# Patient Record
Sex: Male | Born: 1935 | Race: White | Hispanic: No | Marital: Married | Smoking: Never smoker
Health system: Southern US, Community
[De-identification: ages and names within clinical notes are randomized; demographics above are authoritative.]

## PROBLEM LIST (undated history)

## (undated) DIAGNOSIS — R413 Other amnesia: Secondary | ICD-10-CM

## (undated) DIAGNOSIS — I350 Nonrheumatic aortic (valve) stenosis: Secondary | ICD-10-CM

## (undated) DIAGNOSIS — I2510 Atherosclerotic heart disease of native coronary artery without angina pectoris: Secondary | ICD-10-CM

## (undated) DIAGNOSIS — K5790 Diverticulosis of intestine, part unspecified, without perforation or abscess without bleeding: Secondary | ICD-10-CM

## (undated) DIAGNOSIS — I639 Cerebral infarction, unspecified: Secondary | ICD-10-CM

## (undated) DIAGNOSIS — N390 Urinary tract infection, site not specified: Secondary | ICD-10-CM

## (undated) DIAGNOSIS — E785 Hyperlipidemia, unspecified: Secondary | ICD-10-CM

## (undated) DIAGNOSIS — D126 Benign neoplasm of colon, unspecified: Secondary | ICD-10-CM

## (undated) DIAGNOSIS — I10 Essential (primary) hypertension: Secondary | ICD-10-CM

## (undated) DIAGNOSIS — J449 Chronic obstructive pulmonary disease, unspecified: Secondary | ICD-10-CM

## (undated) DIAGNOSIS — K648 Other hemorrhoids: Secondary | ICD-10-CM

## (undated) HISTORY — PX: TONSILLECTOMY: SUR1361

## (undated) HISTORY — PX: NASAL SINUS SURGERY: SHX719

## (undated) HISTORY — PX: SKIN GRAFT: SHX250

## (undated) HISTORY — DX: Essential (primary) hypertension: I10

## (undated) HISTORY — PX: INGUINAL HERNIA REPAIR: SUR1180

## (undated) HISTORY — DX: Other amnesia: R41.3

## (undated) HISTORY — PX: OTHER SURGICAL HISTORY: SHX169

## (undated) HISTORY — DX: Diverticulosis of intestine, part unspecified, without perforation or abscess without bleeding: K57.90

## (undated) HISTORY — DX: Chronic obstructive pulmonary disease, unspecified: J44.9

## (undated) HISTORY — DX: Hyperlipidemia, unspecified: E78.5

## (undated) HISTORY — DX: Nonrheumatic aortic (valve) stenosis: I35.0

## (undated) HISTORY — DX: Other hemorrhoids: K64.8

## (undated) HISTORY — DX: Benign neoplasm of colon, unspecified: D12.6

## (undated) HISTORY — PX: SHOULDER SURGERY: SHX246

## (undated) HISTORY — PX: CORONARY ANGIOPLASTY WITH STENT PLACEMENT: SHX49

## (undated) HISTORY — DX: Atherosclerotic heart disease of native coronary artery without angina pectoris: I25.10

## (undated) HISTORY — DX: Cerebral infarction, unspecified: I63.9

## (undated) HISTORY — PX: KNEE SURGERY: SHX244

---

## 1998-02-25 ENCOUNTER — Other Ambulatory Visit: Admission: RE | Admit: 1998-02-25 | Discharge: 1998-02-25 | Payer: Self-pay | Admitting: Otolaryngology

## 1998-03-25 ENCOUNTER — Ambulatory Visit (HOSPITAL_COMMUNITY): Admission: RE | Admit: 1998-03-25 | Discharge: 1998-03-25 | Payer: Self-pay | Admitting: Internal Medicine

## 1999-10-22 ENCOUNTER — Inpatient Hospital Stay (HOSPITAL_COMMUNITY): Admission: EM | Admit: 1999-10-22 | Discharge: 1999-10-26 | Payer: Self-pay | Admitting: Emergency Medicine

## 1999-10-22 ENCOUNTER — Encounter: Payer: Self-pay | Admitting: Emergency Medicine

## 1999-11-05 ENCOUNTER — Ambulatory Visit (HOSPITAL_COMMUNITY): Admission: RE | Admit: 1999-11-05 | Discharge: 1999-11-06 | Payer: Self-pay | Admitting: *Deleted

## 1999-11-24 ENCOUNTER — Encounter (HOSPITAL_COMMUNITY): Admission: RE | Admit: 1999-11-24 | Discharge: 2000-02-22 | Payer: Self-pay | Admitting: Endocrinology

## 2000-01-27 ENCOUNTER — Encounter: Payer: Self-pay | Admitting: Cardiology

## 2000-01-27 ENCOUNTER — Inpatient Hospital Stay (HOSPITAL_COMMUNITY): Admission: EM | Admit: 2000-01-27 | Discharge: 2000-01-30 | Payer: Self-pay | Admitting: Emergency Medicine

## 2000-03-07 ENCOUNTER — Encounter (HOSPITAL_COMMUNITY): Admission: RE | Admit: 2000-03-07 | Discharge: 2000-06-05 | Payer: Self-pay | Admitting: *Deleted

## 2000-04-14 ENCOUNTER — Encounter: Payer: Self-pay | Admitting: *Deleted

## 2000-04-15 ENCOUNTER — Encounter: Payer: Self-pay | Admitting: *Deleted

## 2000-04-15 ENCOUNTER — Inpatient Hospital Stay (HOSPITAL_COMMUNITY): Admission: RE | Admit: 2000-04-15 | Discharge: 2000-04-17 | Payer: Self-pay | Admitting: *Deleted

## 2000-06-06 ENCOUNTER — Encounter (HOSPITAL_COMMUNITY): Admission: RE | Admit: 2000-06-06 | Discharge: 2000-09-04 | Payer: Self-pay | Admitting: *Deleted

## 2000-09-05 ENCOUNTER — Encounter (HOSPITAL_COMMUNITY): Admission: RE | Admit: 2000-09-05 | Discharge: 2000-12-04 | Payer: Self-pay | Admitting: *Deleted

## 2000-09-30 ENCOUNTER — Encounter: Payer: Self-pay | Admitting: Endocrinology

## 2000-09-30 ENCOUNTER — Ambulatory Visit (HOSPITAL_COMMUNITY): Admission: RE | Admit: 2000-09-30 | Discharge: 2000-09-30 | Payer: Self-pay | Admitting: Endocrinology

## 2000-10-12 ENCOUNTER — Encounter: Admission: RE | Admit: 2000-10-12 | Discharge: 2000-10-24 | Payer: Self-pay | Admitting: Neurology

## 2000-12-05 ENCOUNTER — Encounter (HOSPITAL_COMMUNITY): Admission: RE | Admit: 2000-12-05 | Discharge: 2001-03-05 | Payer: Self-pay | Admitting: *Deleted

## 2001-03-06 ENCOUNTER — Encounter (HOSPITAL_COMMUNITY): Admission: RE | Admit: 2001-03-06 | Discharge: 2001-06-04 | Payer: Self-pay | Admitting: *Deleted

## 2001-06-05 ENCOUNTER — Encounter (HOSPITAL_COMMUNITY): Admission: RE | Admit: 2001-06-05 | Discharge: 2001-09-03 | Payer: Self-pay | Admitting: *Deleted

## 2001-09-22 ENCOUNTER — Encounter (HOSPITAL_COMMUNITY): Admission: RE | Admit: 2001-09-22 | Discharge: 2001-12-21 | Payer: Self-pay | Admitting: *Deleted

## 2001-12-23 ENCOUNTER — Encounter (HOSPITAL_COMMUNITY): Admission: RE | Admit: 2001-12-23 | Discharge: 2002-03-23 | Payer: Self-pay | Admitting: *Deleted

## 2002-03-24 ENCOUNTER — Encounter (HOSPITAL_COMMUNITY): Admission: RE | Admit: 2002-03-24 | Discharge: 2002-06-22 | Payer: Self-pay | Admitting: *Deleted

## 2002-06-23 ENCOUNTER — Encounter (HOSPITAL_COMMUNITY): Admission: RE | Admit: 2002-06-23 | Discharge: 2002-09-21 | Payer: Self-pay | Admitting: Interventional Cardiology

## 2002-09-22 ENCOUNTER — Encounter (HOSPITAL_COMMUNITY): Admission: RE | Admit: 2002-09-22 | Discharge: 2002-12-21 | Payer: Self-pay | Admitting: Cardiology

## 2002-12-23 ENCOUNTER — Encounter (HOSPITAL_COMMUNITY): Admission: RE | Admit: 2002-12-23 | Discharge: 2003-03-23 | Payer: Self-pay | Admitting: Cardiology

## 2003-03-24 ENCOUNTER — Encounter (HOSPITAL_COMMUNITY): Admission: RE | Admit: 2003-03-24 | Discharge: 2003-06-22 | Payer: Self-pay | Admitting: Cardiology

## 2003-06-23 ENCOUNTER — Encounter (HOSPITAL_COMMUNITY): Admission: RE | Admit: 2003-06-23 | Discharge: 2003-09-21 | Payer: Self-pay | Admitting: Cardiology

## 2003-09-23 ENCOUNTER — Encounter (HOSPITAL_COMMUNITY): Admission: RE | Admit: 2003-09-23 | Discharge: 2003-12-22 | Payer: Self-pay | Admitting: Cardiology

## 2003-10-18 ENCOUNTER — Encounter: Admission: RE | Admit: 2003-10-18 | Discharge: 2003-10-18 | Payer: Self-pay

## 2003-10-22 ENCOUNTER — Ambulatory Visit (HOSPITAL_BASED_OUTPATIENT_CLINIC_OR_DEPARTMENT_OTHER): Admission: RE | Admit: 2003-10-22 | Discharge: 2003-10-22 | Payer: Self-pay

## 2003-10-22 ENCOUNTER — Ambulatory Visit (HOSPITAL_COMMUNITY): Admission: RE | Admit: 2003-10-22 | Discharge: 2003-10-22 | Payer: Self-pay

## 2003-12-24 ENCOUNTER — Encounter (HOSPITAL_COMMUNITY): Admission: RE | Admit: 2003-12-24 | Discharge: 2004-03-23 | Payer: Self-pay | Admitting: Cardiology

## 2004-03-24 ENCOUNTER — Encounter (HOSPITAL_COMMUNITY): Admission: RE | Admit: 2004-03-24 | Discharge: 2004-06-22 | Payer: Self-pay | Admitting: Cardiology

## 2004-06-23 ENCOUNTER — Encounter (HOSPITAL_COMMUNITY): Admission: RE | Admit: 2004-06-23 | Discharge: 2004-09-21 | Payer: Self-pay | Admitting: Cardiology

## 2004-09-22 ENCOUNTER — Encounter (HOSPITAL_COMMUNITY): Admission: RE | Admit: 2004-09-22 | Discharge: 2004-12-21 | Payer: Self-pay | Admitting: Cardiology

## 2004-11-06 ENCOUNTER — Ambulatory Visit: Payer: Self-pay | Admitting: *Deleted

## 2004-11-19 ENCOUNTER — Ambulatory Visit: Payer: Self-pay

## 2004-12-23 ENCOUNTER — Encounter (HOSPITAL_COMMUNITY): Admission: RE | Admit: 2004-12-23 | Discharge: 2005-03-23 | Payer: Self-pay | Admitting: Cardiology

## 2005-01-09 ENCOUNTER — Emergency Department (HOSPITAL_COMMUNITY): Admission: EM | Admit: 2005-01-09 | Discharge: 2005-01-09 | Payer: Self-pay | Admitting: Family Medicine

## 2005-03-24 ENCOUNTER — Encounter (HOSPITAL_COMMUNITY): Admission: RE | Admit: 2005-03-24 | Discharge: 2005-06-22 | Payer: Self-pay | Admitting: Cardiology

## 2005-04-23 ENCOUNTER — Ambulatory Visit: Payer: Self-pay | Admitting: *Deleted

## 2005-06-23 ENCOUNTER — Encounter (HOSPITAL_COMMUNITY): Admission: RE | Admit: 2005-06-23 | Discharge: 2005-07-23 | Payer: Self-pay | Admitting: Cardiology

## 2005-08-22 ENCOUNTER — Encounter (HOSPITAL_COMMUNITY): Admission: RE | Admit: 2005-08-22 | Discharge: 2005-11-21 | Payer: Self-pay | Admitting: Cardiology

## 2005-11-03 ENCOUNTER — Ambulatory Visit: Payer: Self-pay | Admitting: Cardiology

## 2005-11-23 ENCOUNTER — Encounter (HOSPITAL_COMMUNITY): Admission: RE | Admit: 2005-11-23 | Discharge: 2006-02-21 | Payer: Self-pay | Admitting: Cardiology

## 2005-11-29 ENCOUNTER — Ambulatory Visit: Payer: Self-pay

## 2006-02-22 ENCOUNTER — Encounter (HOSPITAL_COMMUNITY): Admission: RE | Admit: 2006-02-22 | Discharge: 2006-05-23 | Payer: Self-pay | Admitting: Cardiology

## 2006-04-19 ENCOUNTER — Encounter: Payer: Self-pay | Admitting: Internal Medicine

## 2006-04-19 ENCOUNTER — Ambulatory Visit: Payer: Self-pay | Admitting: Internal Medicine

## 2006-05-24 ENCOUNTER — Encounter (HOSPITAL_COMMUNITY): Admission: RE | Admit: 2006-05-24 | Discharge: 2006-08-22 | Payer: Self-pay | Admitting: Cardiology

## 2006-06-27 ENCOUNTER — Ambulatory Visit: Payer: Self-pay | Admitting: Internal Medicine

## 2006-06-29 ENCOUNTER — Ambulatory Visit: Payer: Self-pay | Admitting: Cardiology

## 2006-08-03 ENCOUNTER — Ambulatory Visit: Payer: Self-pay | Admitting: Cardiology

## 2006-08-05 ENCOUNTER — Ambulatory Visit: Payer: Self-pay

## 2006-08-23 ENCOUNTER — Encounter (HOSPITAL_COMMUNITY): Admission: RE | Admit: 2006-08-23 | Discharge: 2006-11-21 | Payer: Self-pay | Admitting: Cardiology

## 2006-11-22 ENCOUNTER — Encounter (HOSPITAL_COMMUNITY): Admission: RE | Admit: 2006-11-22 | Discharge: 2007-02-20 | Payer: Self-pay | Admitting: Cardiology

## 2006-12-05 ENCOUNTER — Ambulatory Visit: Payer: Self-pay | Admitting: Cardiology

## 2007-02-21 ENCOUNTER — Encounter (HOSPITAL_COMMUNITY): Admission: RE | Admit: 2007-02-21 | Discharge: 2007-05-22 | Payer: Self-pay | Admitting: Cardiology

## 2007-05-23 ENCOUNTER — Encounter (HOSPITAL_COMMUNITY): Admission: RE | Admit: 2007-05-23 | Discharge: 2007-08-21 | Payer: Self-pay | Admitting: Cardiology

## 2007-07-27 ENCOUNTER — Ambulatory Visit: Payer: Self-pay | Admitting: Internal Medicine

## 2007-08-23 ENCOUNTER — Encounter (HOSPITAL_COMMUNITY): Admission: RE | Admit: 2007-08-23 | Discharge: 2007-11-21 | Payer: Self-pay | Admitting: Cardiology

## 2007-09-05 ENCOUNTER — Encounter: Payer: Self-pay | Admitting: Internal Medicine

## 2007-09-05 ENCOUNTER — Ambulatory Visit: Payer: Self-pay | Admitting: Internal Medicine

## 2007-11-14 ENCOUNTER — Ambulatory Visit (HOSPITAL_COMMUNITY): Admission: RE | Admit: 2007-11-14 | Discharge: 2007-11-14 | Payer: Self-pay | Admitting: Endocrinology

## 2007-11-23 ENCOUNTER — Encounter (HOSPITAL_COMMUNITY): Admission: RE | Admit: 2007-11-23 | Discharge: 2007-12-22 | Payer: Self-pay | Admitting: Cardiology

## 2007-12-11 ENCOUNTER — Ambulatory Visit: Payer: Self-pay | Admitting: Cardiology

## 2007-12-24 ENCOUNTER — Encounter (HOSPITAL_COMMUNITY): Admission: RE | Admit: 2007-12-24 | Discharge: 2008-03-23 | Payer: Self-pay | Admitting: Cardiology

## 2008-02-15 DIAGNOSIS — J449 Chronic obstructive pulmonary disease, unspecified: Secondary | ICD-10-CM

## 2008-02-15 DIAGNOSIS — I359 Nonrheumatic aortic valve disorder, unspecified: Secondary | ICD-10-CM | POA: Insufficient documentation

## 2008-02-15 DIAGNOSIS — E785 Hyperlipidemia, unspecified: Secondary | ICD-10-CM | POA: Insufficient documentation

## 2008-03-24 ENCOUNTER — Encounter (HOSPITAL_COMMUNITY): Admission: RE | Admit: 2008-03-24 | Discharge: 2008-06-22 | Payer: Self-pay | Admitting: Cardiology

## 2008-06-23 ENCOUNTER — Encounter (HOSPITAL_COMMUNITY): Admission: RE | Admit: 2008-06-23 | Discharge: 2008-09-21 | Payer: Self-pay | Admitting: Cardiology

## 2008-09-23 ENCOUNTER — Encounter (HOSPITAL_COMMUNITY): Admission: RE | Admit: 2008-09-23 | Discharge: 2008-12-22 | Payer: Self-pay | Admitting: Cardiology

## 2008-12-12 ENCOUNTER — Ambulatory Visit: Payer: Self-pay | Admitting: Cardiology

## 2008-12-23 ENCOUNTER — Encounter (HOSPITAL_COMMUNITY): Admission: RE | Admit: 2008-12-23 | Discharge: 2009-03-23 | Payer: Self-pay | Admitting: Cardiology

## 2009-01-24 ENCOUNTER — Encounter: Admission: RE | Admit: 2009-01-24 | Discharge: 2009-01-24 | Payer: Self-pay | Admitting: Internal Medicine

## 2009-03-24 ENCOUNTER — Encounter (HOSPITAL_COMMUNITY): Admission: RE | Admit: 2009-03-24 | Discharge: 2009-06-22 | Payer: Self-pay | Admitting: Cardiology

## 2009-06-23 ENCOUNTER — Encounter (HOSPITAL_COMMUNITY): Admission: RE | Admit: 2009-06-23 | Discharge: 2009-09-21 | Payer: Self-pay | Admitting: Cardiology

## 2009-08-11 ENCOUNTER — Telehealth: Payer: Self-pay | Admitting: Cardiology

## 2009-09-03 ENCOUNTER — Encounter: Payer: Self-pay | Admitting: Cardiology

## 2009-09-05 ENCOUNTER — Telehealth: Payer: Self-pay | Admitting: Cardiology

## 2009-09-22 ENCOUNTER — Encounter (HOSPITAL_COMMUNITY): Admission: RE | Admit: 2009-09-22 | Discharge: 2009-12-21 | Payer: Self-pay | Admitting: Cardiology

## 2009-10-08 ENCOUNTER — Telehealth: Payer: Self-pay | Admitting: Cardiology

## 2009-12-17 ENCOUNTER — Ambulatory Visit: Payer: Self-pay | Admitting: Cardiology

## 2009-12-23 ENCOUNTER — Encounter (HOSPITAL_COMMUNITY): Admission: RE | Admit: 2009-12-23 | Discharge: 2010-03-23 | Payer: Self-pay | Admitting: Cardiology

## 2010-01-01 ENCOUNTER — Encounter: Payer: Self-pay | Admitting: Cardiology

## 2010-01-01 ENCOUNTER — Ambulatory Visit: Payer: Self-pay | Admitting: Cardiology

## 2010-01-01 ENCOUNTER — Ambulatory Visit: Payer: Self-pay

## 2010-01-01 ENCOUNTER — Ambulatory Visit (HOSPITAL_COMMUNITY): Admission: RE | Admit: 2010-01-01 | Discharge: 2010-01-01 | Payer: Self-pay | Admitting: Cardiology

## 2010-01-07 ENCOUNTER — Telehealth: Payer: Self-pay | Admitting: Cardiology

## 2010-03-24 ENCOUNTER — Encounter (HOSPITAL_COMMUNITY): Admission: RE | Admit: 2010-03-24 | Discharge: 2010-06-22 | Payer: Self-pay | Admitting: Cardiology

## 2010-06-23 ENCOUNTER — Encounter (HOSPITAL_COMMUNITY): Admission: RE | Admit: 2010-06-23 | Discharge: 2010-09-21 | Payer: Self-pay | Admitting: Cardiology

## 2010-08-19 ENCOUNTER — Encounter: Payer: Self-pay | Admitting: Cardiology

## 2010-09-22 ENCOUNTER — Encounter (HOSPITAL_COMMUNITY)
Admission: RE | Admit: 2010-09-22 | Discharge: 2010-12-21 | Payer: Self-pay | Source: Home / Self Care | Attending: Cardiology | Admitting: Cardiology

## 2010-11-05 ENCOUNTER — Encounter: Payer: Self-pay | Admitting: Cardiovascular Disease

## 2010-11-26 ENCOUNTER — Ambulatory Visit
Admission: RE | Admit: 2010-11-26 | Discharge: 2010-11-26 | Payer: Self-pay | Source: Home / Self Care | Attending: Cardiovascular Disease | Admitting: Cardiovascular Disease

## 2010-11-26 ENCOUNTER — Encounter: Payer: Self-pay | Admitting: Cardiovascular Disease

## 2010-12-22 NOTE — Letter (Signed)
[facility]   Imported By: Marylou Mccoy 09/10/2010 13:24:41  _____________________________________________________________________  External Attachment:    Type:   Image     Comment:   External Document

## 2010-12-22 NOTE — Progress Notes (Signed)
Summary: returning call  Phone Note Call from Patient Call back at Home Phone [phone]   Caller: Patient Reason for Call: Talk to Nurse Summary of Call: returning call Initial call taken by: Migdalia Dk,  January 07, 2010 1:30 PM  Follow-up for Phone Call        I spoke with the pt. Follow-up by: Sherri Rad, RN, BSN,  January 07, 2010 2:08 PM

## 2010-12-22 NOTE — Assessment & Plan Note (Signed)
Summary: yearly   Visit Type:  Follow-up Primary Provider:  Dr Laurene Footman  CC:  pt has had the flu since sun. No cardaic complaints.  History of Present Illness: The patient is 77 years old and return for management of CAD. In 2000 he had a lateral MI and was treated with a bare-metal stent to the circumflex artery. He had staged bare-metal stent to the LAD. He later developed in-stent restenosis of the circumflex artery and required repeat PCI. His last catheterization was in 2001 and this was complicated by a stroke from which he separately recovered. At that time he had 70% in-stent restenosis but no further treatment was performed.  He says he's done quite well with no recent chest pain shortness of breath or palpitations.  His other medical problems include hypertension hyperlipidemia. He also has COPD and has a history of mild aortic stenosis.                                              \ \  Current Medications (verified): 1)  Altace 2.5 Mg Caps (Ramipril) .... Take One Tablet By Mouth Once Daily. 2)  Metoprolol Tartrate 50 Mg Tabs (Metoprolol Tartrate) .... 1/2 Two Times A Day 3)  Plavix 75 Mg Tabs (Clopidogrel Bisulfate) .... Take One Tablet By Mouth Daily 4)  Accolate 20 Mg Tabs (Zafirlukast) .Marland Kitchen.. 1 Tab Once Daily 5)  Lipitor 40 Mg Tabs (Atorvastatin Calcium) .... Take One Tablet By Mouth Daily. 6)  Nasonex 50 Mcg/act Susp (Mometasone Furoate) .... 2 Sprays At Bed Time (Nose) 7)  Advair Diskus 250-50 Mcg/dose Aepb (Fluticasone-Salmeterol) .Marland Kitchen.. 1 Puff Two Times A Day 8)  Multivitamins   Tabs (Multiple Vitamin) .Marland Kitchen.. 1 Tab Once Daily 9)  Calicum .Marland Kitchen.. 1 Tab Once Daily 10)  Fish Oil 1000mg  .... 1 Tab Every Other Day  Allergies (verified): 1)  ! Jonne Ply  Past History:  Past Medical History: Reviewed history from 12/15/2009 and no changes required. 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 Coronary artery status post prior myocardial infarction and     multiple percutaneous interventions as described above with     nonobstructive disease at last cath and low-risk Myoview scan in     2007. 2. Paraprocedural stroke in 2001 with little residual following     diagnostic catheterization. 3. Good left ventricular function. 4. Mild aortic stenosis. 5. Hypertension. 6. Hyperlipidemia. 7. Chronic obstructive pulmonary disease.   Review of Systems       ROS is negative except as outlined in HPI.   Vital Signs:  Patient profile:   77 year old male Height:      65 inches Weight:      108 pounds BMI:     18.04 Pulse rate:   55 / minute BP sitting:   116 / 54  (left arm) Cuff size:   regular  Vitals Entered By: Burnett Kanaris, CNA (December 17, 2009 9:45 AM)  Physical Exam  Additional Exam:  Gen. Well-nourished, in no distress   Neck: No JVD, thyroid not enlarged, no carotid bruits Lungs: No tachypnea, clear without rales, rhonchi or wheezes Cardiovascular: Rhythm regular, PMI not displaced,  heart sounds  normal, grade 2/6 systolic ejection murmur, no peripheral edema, pulses normal in all  4 extremities. Abdomen: BS normal, abdomen soft and non-tender without masses or organomegaly, no hepatosplenomegaly. MS: No deformities, no cyanosis or clubbing   Neuro:  No focal sns   Skin:  no lesions    Impression & Recommendations:  Problem # 1:  CAD (ICD-414.00) He had previous PCI procedures in 2000 as described above. His last catheterization was complicated by a stroke so we should have a moderately high threshold for catheter and intervention. This is not an issue now since he is doing very well has had no recent symptoms of chest pain shortness of breath or palpitations.   Orders: EKG w/  Interpretation (93000)  Problem # 2:  HYPERTENSION (ICD-401.9) This is well controlled on current medications. His updated medication list for this problem includes:    Altace 2.5 Mg Caps (Ramipril) .Marland Kitchen... Take one tablet by mouth once daily.    Metoprolol Tartrate 50 Mg Tabs (Metoprolol tartrate) .Marland Kitchen... 1/2 two times a day  Problem # 3:  HYPERLIPIDEMIA (ICD-272.4) He is scheduled to have a lipid profile with Dr. with Dr. Evlyn Kanner next month. His updated medication list for this problem includes:    Lipitor 40 Mg Tabs (Atorvastatin calcium) .Marland Kitchen... Take one tablet by mouth daily.  Problem # 4:  AORTIC STENOSIS, MILD (ICD-424.1) He has a history of aortic stenosis and hasn't had an echocardiogram and a number of years. His murmur does not sound like his aortic stenosis is very severe but we will repeat his echocardiogram. His updated medication list for this problem includes:    Altace 2.5 Mg Caps (Ramipril) .Marland Kitchen... Take one tablet by mouth once daily.    Metoprolol Tartrate 50 Mg Tabs (Metoprolol tartrate) .Marland Kitchen... 1/2 two times a day    Nitrostat 0.4 Mg Subl (Nitroglycerin) .Marland Kitchen... 1 tablet under tongue at onset of chest pain; you may repeat every 5 minutes for up to 3 doses.  Orders: EKG w/ Interpretation (93000) Echocardiogram (Echo)  His updated medication list for this problem includes:    Altace 2.5 Mg Caps (Ramipril) .Marland Kitchen... Take one tablet by mouth once daily.    Metoprolol Tartrate 50 Mg Tabs (Metoprolol tartrate) .Marland Kitchen... 1/2 two times a day    Nitrostat 0.4 Mg Subl (Nitroglycerin) .Marland Kitchen... 1 tablet under tongue at onset of chest pain; you may repeat every 5 minutes for up to 3 doses.  Patient Instructions: 1)  Your physician wants you to follow-up in: 1 year with Dr. Clifton James.  You will receive a reminder letter in the mail two months in advance. If you don't receive a letter, please call our office to schedule the follow-up appointment. 2)  Your physician has requested that you have an  echocardiogram.  Echocardiography is a painless test that uses sound waves to create images of your heart. It provides your doctor with information about the size and shape of your heart and how well your heart's chambers and valves are working.  This procedure takes approximately one hour. There are no restrictions for this procedure. Prescriptions: NITROSTAT 0.4 MG SUBL (NITROGLYCERIN) 1 tablet under tongue at onset of chest pain; you may repeat every 5 minutes for up to 3 doses.  #25 x 3   Entered by:   Sherri Rad, RN, BSN   Authorized by:   Lenoria Farrier, MD, [facility]   Signed by:   Sherri Rad, RN, BSN on 12/17/2009   Method used:   [facility]        [facility] [address] [phone]* (retail)  [address]       [place], [place]  [address]       Ph: [phone] or [phone]       Fax: [phone]   RxID:   [id number] METOPROLOL TARTRATE 50 MG TABS (METOPROLOL TARTRATE) 1/2 two times a day  #30 x 11   Entered by:   Sherri Rad, RN, BSN   Authorized by:   Lenoria Farrier, MD, [facility]   Signed by:   Sherri Rad, RN, BSN on 12/17/2009   Method used:   [facility]ronically to        [facility] [address] [phone]* (retail)       [address]       [place], [place]  [address]       Ph: [phone] or [phone]       Fax: [phone]   RxID:   [id number] ALTACE 2.5 MG CAPS (RAMIPRIL) Take one tablet by mouth once daily.  #30 x 11   Entered by:   Sherri Rad, RN, BSN   Authorized by:   Lenoria Farrier, MD, [facility]   Signed by:   Sherri Rad, RN, BSN on 12/17/2009   Method used:   Electronically to        [facility] [address] [phone]* (retail)       [address]       [place], [place]  [address]       Ph: [phone] or [phone]       Fax: [phone]   RxID:   [id number]

## 2010-12-24 ENCOUNTER — Encounter (HOSPITAL_COMMUNITY): Payer: Self-pay | Attending: Cardiovascular Disease

## 2010-12-24 ENCOUNTER — Ambulatory Visit (HOSPITAL_COMMUNITY): Payer: Self-pay

## 2010-12-24 DIAGNOSIS — Z8249 Family history of ischemic heart disease and other diseases of the circulatory system: Secondary | ICD-10-CM | POA: Insufficient documentation

## 2010-12-24 DIAGNOSIS — Z5189 Encounter for other specified aftercare: Secondary | ICD-10-CM | POA: Insufficient documentation

## 2010-12-24 DIAGNOSIS — E785 Hyperlipidemia, unspecified: Secondary | ICD-10-CM | POA: Insufficient documentation

## 2010-12-24 DIAGNOSIS — Z9861 Coronary angioplasty status: Secondary | ICD-10-CM | POA: Insufficient documentation

## 2010-12-24 DIAGNOSIS — I2510 Atherosclerotic heart disease of native coronary artery without angina pectoris: Secondary | ICD-10-CM | POA: Insufficient documentation

## 2010-12-24 NOTE — Assessment & Plan Note (Signed)
Summary: per check out/sf   Visit Type:  1 yr f/u Primary Provider:  Dr Laurene Footman  CC:  pt states he would like a copy of his EKG faxed to Dr. Laurene Footman...denies any cardiac complaints today..pt previously saw Dr. Charlies Constable.  History of Present Illness: The patient is 77 yo male with history of CAD, HTN, HL, mild aortic valve stenosis,  and COPD. He has been previously followed by Dr Juanda Chance. He is here today fro cardiac follow up and to establish with me.  In 2000 he had a lateral MI and was treated with a bare-metal stent to the circumflex artery. He had staged bare-metal stent to the LAD. He later developed in-stent restenosis of the circumflex artery and required repeat PCI. His last catheterization was in 2001 and this was complicated by a stroke from which he separately recovered. At that time he had 70% in-stent restenosis but no further treatment was performed.   He tells me that he has been feeling well. He has had no chest pains. He does his yard work. He still attends cardiac rehab at [facility] for the last ten years. His breathing has been normal. His exercise tolerance is good.     Current Medications (verified): 1)  Altace 2.5 Mg Caps (Ramipril) .... Take One Tablet By Mouth Once Daily. 2)  Metoprolol Tartrate 50 Mg Tabs (Metoprolol Tartrate) .... 1/2 Two Times A Day 3)  Plavix 75 Mg Tabs (Clopidogrel Bisulfate) .... Take One Tablet By Mouth Daily 4)  Accolate 20 Mg Tabs (Zafirlukast) .Marland Kitchen.. 1 Tab Once Daily 5)  Lipitor 40 Mg Tabs (Atorvastatin Calcium) .... Take One Tablet By Mouth Daily. 6)  Nasonex 50 Mcg/act Susp (Mometasone Furoate) .... 2 Sprays At Bed Time (Nose) 7)  Advair Diskus 250-50 Mcg/dose Aepb (Fluticasone-Salmeterol) .Marland Kitchen.. 1 Puff Two Times A Day 8)  Multivitamins   Tabs (Multiple Vitamin) .Marland Kitchen.. 1 Tab Once Daily 9)  Viactiv Multi-Vitamin  Chew (Multiple Vitamins-Calcium) .Marland Kitchen.. 1 Chew Once Daily 10)  Fish Oil 1000 Mg Caps (Omega-3 Fatty Acids) .Marland Kitchen.. 1 Cap Every  Other Day 11)  Nitrostat 0.4 Mg Subl (Nitroglycerin) .Marland Kitchen.. 1 Tablet Under Tongue At Onset of Chest Pain; You May Repeat Every 5 Minutes For Up To 3 Doses.  Allergies: 1)  ! Jonne Ply  Past History:  Past Medical History: Reviewed history from 12/15/2009 and no changes required. Current Problems:  ADENOMATOUS COLONIC POLYP (ICD-211.3) INTERNAL HEMORRHOIDS (ICD-455.0) DIVERTICULOSIS, COLON (ICD-562.10) HYPERTENSION (ICD-401.9) COPD (ICD-496) AORTIC STENOSIS, MILD (ICD-424.1) HYPERLIPIDEMIA (ICD-272.4) CAD (ICD-414.00) 1. Coronary artery status post prior myocardial infarction and     multiple percutaneous interventions as described above with     nonobstructive disease at last cath and low-risk Myoview scan in     2007. 2. Paraprocedural stroke in 2001 with little residual following     diagnostic catheterization. 3. Good left ventricular function. 4. Mild aortic stenosis. 5. Hypertension. 6. Hyperlipidemia. 7. Chronic obstructive pulmonary disease.   Past Surgical History: Reviewed history from 02/15/2008 and no changes required. Left shoulder surgery Left inguinal hernia repair Left knee surgery Sinus surgery Tonsillectomy  Family History: No CAD  Social History: Married since 1999 1 son Retired-[employer] for 44 years Never smoked 1 glass red wine every other day No illcit drug use  Review of Systems  The patient denies fatigue, malaise, fever, weight gain/loss, vision loss, decreased hearing, hoarseness, chest pain, palpitations, shortness of breath, prolonged cough, wheezing, sleep apnea, coughing up blood, abdominal pain, blood in stool,  nausea, vomiting, diarrhea, heartburn, incontinence, blood in urine, muscle weakness, joint pain, leg swelling, rash, skin lesions, headache, fainting, dizziness, depression, anxiety, enlarged lymph nodes, easy bruising or bleeding, and environmental allergies.    Vital Signs:  Patient profile:   76 year old male Height:       65 inches Weight:      109.25 pounds BMI:     18.25 Pulse rate:   51 / minute Pulse rhythm:   irregular BP sitting:   122 / 64  (left arm) Cuff size:   regular  Vitals Entered By: Danielle Rankin, CMA (November 26, 2010 10:21 AM)  Physical Exam  General:  General: Well developed, well nourished, NAD HEENT: OP clear, mucus membranes moist SKIN: warm, dry Neuro: No focal deficits Musculoskeletal: Muscle strength 5/5 all ext Psychiatric: Mood and affect normal Neck: No JVD, no carotid bruits, no thyromegaly, no lymphadenopathy. Lungs:Clear bilaterally, no wheezes, rhonci, crackles CV: RRR with systolic  murmur, No gallops rubs Abdomen: soft, NT, ND, BS present Extremities: No edema, pulses 2+.    EKG  Procedure date:  11/26/2010  Findings:      Sinus bradycardia, rate 51 bpm. Incomplete RBBB  Impression & Recommendations:  Problem # 1:  CAD (ICD-414.00) Stable. No changes. He is on Plavix. He has an ASA allergy.   His updated medication list for this problem includes:    Altace 2.5 Mg Caps (Ramipril) .Marland Kitchen... Take one tablet by mouth once daily.    Metoprolol Tartrate 50 Mg Tabs (Metoprolol tartrate) .Marland Kitchen... 1/2 two times a day    Plavix 75 Mg Tabs (Clopidogrel bisulfate) .Marland Kitchen... Take one tablet by mouth daily    Nitrostat 0.4 Mg Subl (Nitroglycerin) .Marland Kitchen... 1 tablet under tongue at onset of chest pain; you may repeat every 5 minutes for up to 3 doses.  Problem # 2:  HYPERTENSION (ICD-401.9) Well controlled.   His updated medication list for this problem includes:    Altace 2.5 Mg Caps (Ramipril) .Marland Kitchen... Take one tablet by mouth once daily.    Metoprolol Tartrate 50 Mg Tabs (Metoprolol tartrate) .Marland Kitchen... 1/2 two times a day  Problem # 3:  HYPERLIPIDEMIA (ICD-272.4) Followed by Dr. Evlyn Kanner. No changes today. He is due for repeat lipids in March in primary care.   His updated medication list for this problem includes:    Lipitor 40 Mg Tabs (Atorvastatin calcium) .Marland Kitchen... Take one tablet by  mouth daily.  Problem # 4:  AORTIC STENOSIS, MILD (ICD-424.1) Repeat echo in one year.   His updated medication list for this problem includes:    Altace 2.5 Mg Caps (Ramipril) .Marland Kitchen... Take one tablet by mouth once daily.    Metoprolol Tartrate 50 Mg Tabs (Metoprolol tartrate) .Marland Kitchen... 1/2 two times a day    Nitrostat 0.4 Mg Subl (Nitroglycerin) .Marland Kitchen... 1 tablet under tongue at onset of chest pain; you may repeat every 5 minutes for up to 3 doses.  Patient Instructions: 1)  Your physician recommends that you schedule a follow-up appointment in: 1 year 2)  Your physician recommends that you continue on your current medications as directed. Please refer to the Current Medication list given to you today. 3)  Your physician has requested that you have an echocardiogram in 1 year. Echocardiography is a painless test that uses sound waves to create images of your heart. It provides your doctor with information about the size and shape of your heart and how well your heart's chambers and valves are working.  This procedure  takes approximately one hour. There are no restrictions for this procedure. Prescriptions: NITROSTAT 0.4 MG SUBL (NITROGLYCERIN) 1 tablet under tongue at onset of chest pain; you may repeat every 5 minutes for up to 3 doses.  #25 x 6   Entered by:   Danielle Rankin, CMA   Authorized by:   Verne Carrow, MD   Signed by:   Danielle Rankin, CMA on 11/26/2010   Method used:   Electronically to        [facility] [address] [phone]* (retail)       [address]       [place], [place]  [address]       Ph: [phone] or [phone]       Fax: [phone]   RxID:   [id number]

## 2010-12-29 ENCOUNTER — Encounter (HOSPITAL_COMMUNITY): Payer: Self-pay

## 2010-12-29 ENCOUNTER — Ambulatory Visit (HOSPITAL_COMMUNITY): Payer: Self-pay

## 2010-12-30 NOTE — Miscellaneous (Signed)
Summary: [facility] Flow [facility]   [facility]   Imported By: Roderic Ovens 12/23/2010 10:05:06  _____________________________________________________________________  External Attachment:    Type:   Image     Comment:   External Document

## 2010-12-31 ENCOUNTER — Encounter (HOSPITAL_COMMUNITY): Payer: Self-pay

## 2010-12-31 ENCOUNTER — Ambulatory Visit (HOSPITAL_COMMUNITY): Payer: Self-pay

## 2011-01-05 ENCOUNTER — Ambulatory Visit (HOSPITAL_COMMUNITY): Payer: Self-pay

## 2011-01-05 ENCOUNTER — Encounter (HOSPITAL_COMMUNITY): Payer: Self-pay

## 2011-01-07 ENCOUNTER — Encounter (HOSPITAL_COMMUNITY): Payer: Self-pay

## 2011-01-07 ENCOUNTER — Ambulatory Visit (HOSPITAL_COMMUNITY): Payer: Self-pay

## 2011-01-12 ENCOUNTER — Ambulatory Visit (HOSPITAL_COMMUNITY): Payer: Self-pay

## 2011-01-12 ENCOUNTER — Encounter (HOSPITAL_COMMUNITY): Payer: Self-pay

## 2011-01-14 ENCOUNTER — Ambulatory Visit (HOSPITAL_COMMUNITY): Payer: Self-pay

## 2011-01-14 ENCOUNTER — Encounter (HOSPITAL_COMMUNITY): Payer: Self-pay

## 2011-01-19 ENCOUNTER — Encounter (HOSPITAL_COMMUNITY): Payer: Self-pay

## 2011-01-19 ENCOUNTER — Ambulatory Visit (HOSPITAL_COMMUNITY): Payer: Self-pay

## 2011-01-21 ENCOUNTER — Encounter (HOSPITAL_COMMUNITY): Payer: Self-pay

## 2011-01-21 ENCOUNTER — Encounter (HOSPITAL_COMMUNITY): Payer: Self-pay | Attending: Cardiovascular Disease

## 2011-01-21 ENCOUNTER — Ambulatory Visit (HOSPITAL_COMMUNITY): Payer: Self-pay

## 2011-01-21 DIAGNOSIS — E785 Hyperlipidemia, unspecified: Secondary | ICD-10-CM | POA: Insufficient documentation

## 2011-01-21 DIAGNOSIS — Z9861 Coronary angioplasty status: Secondary | ICD-10-CM | POA: Insufficient documentation

## 2011-01-21 DIAGNOSIS — I2510 Atherosclerotic heart disease of native coronary artery without angina pectoris: Secondary | ICD-10-CM | POA: Insufficient documentation

## 2011-01-21 DIAGNOSIS — Z5189 Encounter for other specified aftercare: Secondary | ICD-10-CM | POA: Insufficient documentation

## 2011-01-21 DIAGNOSIS — Z8249 Family history of ischemic heart disease and other diseases of the circulatory system: Secondary | ICD-10-CM | POA: Insufficient documentation

## 2011-01-26 ENCOUNTER — Encounter (HOSPITAL_COMMUNITY): Payer: Self-pay

## 2011-01-26 ENCOUNTER — Ambulatory Visit (HOSPITAL_COMMUNITY): Payer: Self-pay

## 2011-01-28 ENCOUNTER — Encounter (HOSPITAL_COMMUNITY): Payer: Self-pay

## 2011-01-28 ENCOUNTER — Ambulatory Visit (HOSPITAL_COMMUNITY): Payer: Self-pay

## 2011-02-02 ENCOUNTER — Encounter (HOSPITAL_COMMUNITY): Payer: Self-pay

## 2011-02-02 ENCOUNTER — Ambulatory Visit (HOSPITAL_COMMUNITY): Payer: Self-pay

## 2011-02-04 ENCOUNTER — Ambulatory Visit (HOSPITAL_COMMUNITY): Payer: Self-pay

## 2011-02-04 ENCOUNTER — Encounter (HOSPITAL_COMMUNITY): Payer: Self-pay

## 2011-02-09 ENCOUNTER — Encounter (HOSPITAL_COMMUNITY): Payer: Self-pay

## 2011-02-09 ENCOUNTER — Ambulatory Visit (HOSPITAL_COMMUNITY): Payer: Self-pay

## 2011-02-11 ENCOUNTER — Ambulatory Visit (HOSPITAL_COMMUNITY): Payer: Self-pay

## 2011-02-11 ENCOUNTER — Encounter (HOSPITAL_COMMUNITY): Payer: Self-pay

## 2011-02-12 ENCOUNTER — Other Ambulatory Visit: Payer: Self-pay | Admitting: Cardiovascular Disease

## 2011-02-16 ENCOUNTER — Encounter (HOSPITAL_COMMUNITY): Payer: Self-pay

## 2011-02-16 ENCOUNTER — Ambulatory Visit (HOSPITAL_COMMUNITY): Payer: Self-pay

## 2011-02-18 ENCOUNTER — Encounter (HOSPITAL_COMMUNITY): Payer: Self-pay

## 2011-02-18 ENCOUNTER — Ambulatory Visit (HOSPITAL_COMMUNITY): Payer: Self-pay

## 2011-02-20 ENCOUNTER — Other Ambulatory Visit: Payer: Self-pay | Admitting: *Deleted

## 2011-02-23 ENCOUNTER — Ambulatory Visit (HOSPITAL_COMMUNITY): Payer: Self-pay

## 2011-02-23 ENCOUNTER — Encounter (HOSPITAL_COMMUNITY): Payer: Self-pay

## 2011-02-23 ENCOUNTER — Encounter (HOSPITAL_COMMUNITY): Payer: Self-pay | Attending: Cardiovascular Disease

## 2011-02-23 DIAGNOSIS — Z8249 Family history of ischemic heart disease and other diseases of the circulatory system: Secondary | ICD-10-CM | POA: Insufficient documentation

## 2011-02-23 DIAGNOSIS — Z5189 Encounter for other specified aftercare: Secondary | ICD-10-CM | POA: Insufficient documentation

## 2011-02-23 DIAGNOSIS — I2510 Atherosclerotic heart disease of native coronary artery without angina pectoris: Secondary | ICD-10-CM | POA: Insufficient documentation

## 2011-02-23 DIAGNOSIS — Z9861 Coronary angioplasty status: Secondary | ICD-10-CM | POA: Insufficient documentation

## 2011-02-23 DIAGNOSIS — E785 Hyperlipidemia, unspecified: Secondary | ICD-10-CM | POA: Insufficient documentation

## 2011-02-25 ENCOUNTER — Encounter (HOSPITAL_COMMUNITY): Payer: Self-pay

## 2011-02-25 ENCOUNTER — Telehealth: Payer: Self-pay | Admitting: Cardiovascular Disease

## 2011-02-25 ENCOUNTER — Other Ambulatory Visit: Payer: Self-pay | Admitting: *Deleted

## 2011-02-25 ENCOUNTER — Ambulatory Visit (HOSPITAL_COMMUNITY): Payer: Self-pay

## 2011-02-25 MED ORDER — CLOPIDOGREL BISULFATE 75 MG PO TABS: 75.0000 mg | ORAL_TABLET | Freq: Every day | ORAL | Status: AC

## 2011-02-25 MED ORDER — CLOPIDOGREL BISULFATE 75 MG PO TABS: 75.0000 mg | ORAL_TABLET | Freq: Every day | ORAL | Status: DC

## 2011-02-25 NOTE — Telephone Encounter (Signed)
Pt needs  °

## 2011-03-02 ENCOUNTER — Encounter (HOSPITAL_COMMUNITY): Payer: Self-pay

## 2011-03-02 ENCOUNTER — Ambulatory Visit (HOSPITAL_COMMUNITY): Payer: Self-pay

## 2011-03-04 ENCOUNTER — Encounter (HOSPITAL_COMMUNITY): Payer: Self-pay

## 2011-03-04 ENCOUNTER — Ambulatory Visit (HOSPITAL_COMMUNITY): Payer: Self-pay

## 2011-03-09 ENCOUNTER — Encounter (HOSPITAL_COMMUNITY): Payer: Self-pay

## 2011-03-09 ENCOUNTER — Ambulatory Visit (HOSPITAL_COMMUNITY): Payer: Self-pay

## 2011-03-11 ENCOUNTER — Ambulatory Visit (HOSPITAL_COMMUNITY): Payer: Self-pay

## 2011-03-11 ENCOUNTER — Encounter (HOSPITAL_COMMUNITY): Payer: Self-pay

## 2011-03-14 ENCOUNTER — Other Ambulatory Visit: Payer: Self-pay | Admitting: Cardiovascular Disease

## 2011-03-16 ENCOUNTER — Ambulatory Visit (HOSPITAL_COMMUNITY): Payer: Self-pay

## 2011-03-16 ENCOUNTER — Encounter (HOSPITAL_COMMUNITY): Payer: Self-pay

## 2011-03-18 ENCOUNTER — Encounter (HOSPITAL_COMMUNITY): Payer: Self-pay

## 2011-03-18 ENCOUNTER — Ambulatory Visit (HOSPITAL_COMMUNITY): Payer: Self-pay

## 2011-03-23 ENCOUNTER — Encounter (HOSPITAL_COMMUNITY): Payer: Self-pay | Attending: Cardiovascular Disease

## 2011-03-23 ENCOUNTER — Ambulatory Visit (HOSPITAL_COMMUNITY): Payer: Self-pay

## 2011-03-23 ENCOUNTER — Encounter (HOSPITAL_COMMUNITY): Payer: Self-pay

## 2011-03-23 DIAGNOSIS — I2510 Atherosclerotic heart disease of native coronary artery without angina pectoris: Secondary | ICD-10-CM | POA: Insufficient documentation

## 2011-03-23 DIAGNOSIS — E785 Hyperlipidemia, unspecified: Secondary | ICD-10-CM | POA: Insufficient documentation

## 2011-03-23 DIAGNOSIS — Z8249 Family history of ischemic heart disease and other diseases of the circulatory system: Secondary | ICD-10-CM | POA: Insufficient documentation

## 2011-03-23 DIAGNOSIS — Z9861 Coronary angioplasty status: Secondary | ICD-10-CM | POA: Insufficient documentation

## 2011-03-23 DIAGNOSIS — Z5189 Encounter for other specified aftercare: Secondary | ICD-10-CM | POA: Insufficient documentation

## 2011-03-25 ENCOUNTER — Ambulatory Visit (HOSPITAL_COMMUNITY): Payer: Self-pay

## 2011-03-25 ENCOUNTER — Encounter (HOSPITAL_COMMUNITY): Payer: Self-pay

## 2011-03-30 ENCOUNTER — Encounter (HOSPITAL_COMMUNITY): Payer: Self-pay

## 2011-03-30 ENCOUNTER — Ambulatory Visit (HOSPITAL_COMMUNITY): Payer: Self-pay

## 2011-04-01 ENCOUNTER — Encounter (HOSPITAL_COMMUNITY): Payer: Self-pay

## 2011-04-01 ENCOUNTER — Ambulatory Visit (HOSPITAL_COMMUNITY): Payer: Self-pay

## 2011-04-06 ENCOUNTER — Encounter (HOSPITAL_COMMUNITY): Payer: Self-pay

## 2011-04-06 ENCOUNTER — Ambulatory Visit (HOSPITAL_COMMUNITY): Payer: Self-pay

## 2011-04-06 NOTE — Assessment & Plan Note (Signed)
[facility]                            CARDIOLOGY OFFICE NOTE   BEUFORD, GARCILAZO                        MRN:          [id number]  DATE:12/11/2007                            DOB:          08-21-1930    PRIMARY CARE PHYSICIAN:  Ardyth Harps.   CLINICAL HISTORY:  Mr. Dady is 77 years old and is retired from [employer] and returned for follow-up management of his coronary heart  disease.  He had previous PCI procedures by Dr. Gerri Spore in the LAD and  circumflex artery and then repeat PTCA for in-stent restenosis in the  circumflex artery.  He had a cath done in 2001 which was complicated by  periprocedural stroke at which time he had 70% stenosis within the  stent.  He had a Myoview scan done in September of 2007 which was a low  risk scan which showed a lateral infarct and trivial peri-infarct  ischemia.   Says he has been doing quite well, has had no recent chest pain,  shortness of breath or palpitations.   PAST MEDICAL HISTORY:  1. Hyperlipidemia.  2. Mild aortic stenosis.  3. Hypertension.  4. COPD.   CURRENT MEDICATIONS:  Include Lipitor, Accolate, Nasonex, Plavix,  metoprolol, Altace, Advair, Caltrate.  HE IS ALLERGIC TO ASPIRIN WHICH  CAUSES A RASH.   EXAMINATION:  Blood pressure 140/68 and pulse 58 and regular.  There is  no venous tension.  The carotid pulses were full without bruits.  CHEST:  Clear.  CARDIAC:  Rhythm was regular.  There was 2/6 systolic ejection murmur at  left sternal edge.  ABDOMEN:  Soft.  There were normal bowel sounds, no hepatosplenomegaly.  Peripheral pulses were full, there was no peripheral edema.  SKIN:  Showed an erythematous rash over his upper trunk and upper back.   Electrocardiogram showed right bundle branch block.   IMPRESSION:  1. Coronary artery status post remote percutaneous coronary      interventions as described above with nonobstructive disease at      last catheterization and low  risk Myoview scan 2007.  2. Peri-procedural stroke in 2001 with little residual following a      diagnostic catheterization.  3. Good left ventricular function.  4. Mild aortic stenosis.  5. Hypertension.  6. Hyperlipidemia.  7. Chronic obstructive pulmonary disease.  8. Erythematous rash.   RECOMMENDATIONS:  I think Mr. Tomasik is doing quite well from a cardiac  standpoint.  Dr. Evlyn Kanner is following his lipids and he indicated that  they were quite good and our last reading in 2007 was quite good.  I am  not certain regarding the etiology of his rash.  He said this occurred  after he had an event monitor and he noted some redness at the site of  the electrodes, but then he later developed a rash.  He has been on his  current medications for a long time which makes it less likely they are  related to medications.  I suggested that he follow up with Dr.  Evlyn Kanner  regarding that.  I will  plan to see him back for cardiac follow-up in a  year.  Blood pressure was borderline elevated today but eh says it has  been in the 120/70 range at the rehab program which he is still  participating in 3 times a week.     Bruce Elvera Lennox Juanda Chance, MD, [facility]  Electronically Signed    BRB/MedQ  DD: 12/11/2007  DT: 12/11/2007  Job #: [id number]

## 2011-04-06 NOTE — Assessment & Plan Note (Signed)
[facility]                            CARDIOLOGY OFFICE NOTE   Edward [name], Edward [name]                        MRN:          [id number]  DATE:12/12/2008                            DOB:          November 09, 1930    PRIMARY CARE PHYSICIAN:  Tera Mater. [place], MD   CLINICAL HISTORY:  Edward [name] is 77 years old and has retired from [employer].  He is being followed by Dr. Gerri Spore.  In 2000, he had a  lateral MI and stenting of the circumflex artery with subsequent stage  stenting of the LAD.  Later, he had PTCA for in-stent restenosis in the  circumflex artery.  He had a cath done in 2001, which was complicated by  apparent procedure of stroke.  At that time, he had 70% stenosis within  the stent.  He recovered from his stroke and he has been managed  medically since that time and had a nonischemic Myoview scan in 2007.   He said he has been doing quite well and has had no recent chest pain or  palpitations.  He had an episode of asthma in the past and was treated  by Dr. Evlyn Kanner with inhalers and that is doing better.   He has been quite good with exercise and still involved in the rehab  program and exercises on his own by walking either outside or on a  treadmill.   PAST MEDICAL HISTORY:  Significant for hyperlipidemia, hypertension,  COPD, and mild aortic stenosis.   CURRENT MEDICATIONS:  1. Lipitor 40 mg daily.  2. Accolate.  3. Nasonex.  4. Plavix 75 mg daily.  5. Metoprolol 50 mg 1/2 tablet b.i.d.  6. Altace 2.5 mg daily.  7. Advair.  8. Centrum Silver.  9. Caltrate.  10.Fish oil.   ALLERGIES:  ASPIRIN, which causes a rash.   PHYSICAL EXAMINATION:  VITAL SIGNS:  Blood pressure is 122/60, pulse 62  and regular.  NECK:  There was no venous tension.  The carotid pulses were full  without bruits.  CHEST:  Clear.  CARDIAC:  Rhythm was regular.  There is grade 2/6 systolic ejection  murmur at the left sternal edge.  ABDOMEN:  Soft, normal  bowel sounds.  EXTREMITIES:  Peripheral pulses were equal.  There was no peripheral  edema.   An ECG showed right bundle-branch block and had not changed.   IMPRESSION:  1. Coronary artery status post prior myocardial infarction and      multiple percutaneous interventions as described above with      nonobstructive disease at last cath and low-risk Myoview scan in      2007.  2. Paraprocedural stroke in 2001 with little residual following      diagnostic catheterization.  3. Good left ventricular function.  4. Mild aortic stenosis.  5. Hypertension.  6. Hyperlipidemia.  7. Chronic obstructive pulmonary disease.   RECOMMENDATIONS:  I think, Mr. Beidleman is doing extremely well.  He has  been exercising regularly and all his risk factors are under good  control.  He is going to send  [place] his last lipid profile in [facility].  We will check that again with his physical in May.  I will  see him back in followup in a year.     Bruce Elvera Lennox Juanda Chance, MD, [facility]  Electronically Signed    BRB/MedQ  DD: 12/12/2008  DT: 12/13/2008  Job #: [id number]

## 2011-04-06 NOTE — Assessment & Plan Note (Signed)
[facility]                         GASTROENTEROLOGY OFFICE NOTE   NAME:Edward [name], Edward [name]                        MRN:          [id number]  DATE:07/27/2007                            DOB:          [birth year]    REASON FOR CONSULTATION:  Screening colonoscopy.   HISTORY:  This is a 77 year old white male with multiple medical  problems including hypertension, hyperlipidemia, coronary artery  disease, status post prior coronary interventions including metallic  stent placement, intraprocedural (cardiac catheterization) stroke, mild  aortic stenosis, asthma, and mild memory impairment.  He is accompanied  by his wife.  He was evaluated in this office Apr 19, 2006 for screening  colonoscopy.  We discussed his issues regarding chronic Plavix therapy.  We discussed this with his cardiologist, Dr. Charlies Constable, who approved  holding the drug for 5 days prior to his examination.  However, the  patient developed some presyncope while out in the heat.  This led to  cardiac evaluation, including Holter monitor for 1 month.  These  problems led to postponement of colonoscopy until this time.  It turns  out his Holter was negative, and his problems were felt secondary to  heat.  He has had no interval medical problems, and no significant  change in medications.   His GI review of systems is only remarkable for left-sided discomfort or  bulging for which he saw Dr. Evlyn Kanner and underwent CT scan of the abdomen.  Multiple, non-worrisome abnormalities were noted.  No other significant  complaints.  He denies change in bowel habits or bleeding.   PAST MEDICAL HISTORY:  As above.   PAST SURGICAL HISTORY:  1. Left shoulder surgery.  2. Left inguinal hernia repair.  3. Left knee surgery.  4. Sinus surgery.  5. Tonsillectomy.   ALLERGIES:  SULFA and ASPIRIN.   CURRENT MEDICATIONS:  1. Lipitor.  2. Accolate.  3. Nasonex.  4. Plavix.  5. Metoprolol.  6. Altace.  7.  Advair.  8. Multivitamin.  9. Caltrate with D.  10.Viactiv.   FAMILY HISTORY:  Paternal grandmother with colon cancer.   SOCIAL HISTORY:  Married with 2 children, accompanied by his wife with  whom he lives.  High school graduate and attended college for 1 year.  Worked previously for [employer] of [place].  Does not smoke or use alcohol.   PHYSICAL EXAMINATION:  A well-appearing male in no acute distress.  Blood pressure is 106/62, heart rate is 60 and regular.  Weight is 121.6  pounds.  HEENT:  Sclerae anicteric, conjunctivae are pink, oral mucosa is intact,  no adenopathy.  LUNGS:  Clear.  HEART:  Regular.  ABDOMEN:  Soft, without tenderness, masses, or hernia.  Good bowel  sounds heard.  EXTREMITIES:  Without edema.   IMPRESSION:  This is a 77 year old gentleman with multiple significant  medical problems.  Currently clinically stable.  He is referred  regarding screening colonoscopy.  His gastrointestinal review of systems  is really negative.  He does have some vague, nonspecific, left-sided  bulging with trivial discomfort and a negative CT scan.  His other issue  is management of his anticoagulation which was addressed previously as  discussed above.   RECOMMENDATIONS:  1. Colonoscopy with polypectomy if necessary.  The nature of the      procedure as well as the risks, benefits, and alternatives were      discussed in detail with the patient and his wife.  They understood      and agreed to proceed.  2. Hold Plavix 5 days prior to the procedure.Previously approved by      Dr. Juanda Chance.  3. Ongoing general medical care with Dr. Evlyn Kanner and cardiac care with      Dr. Juanda Chance.     Wilhemina Bonito. Marina Goodell, MD  Electronically Signed    JNP/MedQ  DD: 07/27/2007  DT: 07/27/2007  Job #: [id number]   cc:   Jeannett Senior A. Evlyn Kanner, M.D.  Bruce Elvera Lennox Juanda Chance, MD, [facility]

## 2011-04-08 ENCOUNTER — Encounter (HOSPITAL_COMMUNITY): Payer: Self-pay

## 2011-04-08 ENCOUNTER — Ambulatory Visit (HOSPITAL_COMMUNITY): Payer: Self-pay

## 2011-04-09 NOTE — Consult Note (Signed)
[place]. [facility]  Patient:    RAHKIM, RABALAIS                        MRN: [id number] Proc. Date: 04/14/00 Adm. Date:  [id number] Attending:  Daisey Must Dictator:   [id number] CC:         Daisey Must, M.D. [facility]             [facility]                          Consultation Report  HISTORY OF PRESENT ILLNESS: Sharon Seller R. Mcqueen is a 77 year old white male born 1930/09/04, with a history of hypertension, coronary artery disease, status post stent placement back in November 2000.  The patient presents at this point with ongoing cardiac symptoms and is brought in for coronary catheterization and stent placement.  During the procedure, however, the patient began to develop right hemiparesis and some sleepiness.  The patient seemed improved for about ten minutes but then worsened again with his deficits.  He was sent down for CT scan of the brain, which has been done and is unremarkable.  There is contrast present with the scan.  This patient has been admitted to the coronary intensive care unit and neurology was asked to see the patient for further evaluation.  The patient has been noted to have undulation in the right hemiparesis and speech deficits.  PAST MEDICAL HISTORY:  1. New onset stroke with left middle cerebral artery distribution.  2. History of coronary artery disease, status post stent placement.  3. History of hypertension.  4. History of hyperlipidemia.  5. History of asthma.  6. History of allergies to aspirin and sulfa drugs.  7. History of erectile dysfunction.  MEDICATIONS PRIOR TO ADMISSION:  1. Plavix 75 mg q.d.  2. Metoprolol 25 mg q.d.  3. Altace 2.5 mg q.d.  4. Accolate 20 mg b.i.d.  5. Lipitor 10 mg qd  6. Nasonex nasal spray q.d.  7. Advair 250/50 1 puff q.12h.  SOCIAL HISTORY: The patient is married and lives in the [place], [place] area.  FAMILY HISTORY: Not obtained.  REVIEW OF SYSTEMS:  Difficult to obtain.  The patient has some degree of aphasia at this time and is somewhat sleepy.  PHYSICAL EXAMINATION:  VITAL SIGNS: Blood pressure 136/80, heart rate 68, temperature afebrile.  GENERAL: The patient is a fairly well-developed white male who is alert and cooperative at the time of examination.  HEENT: Pupils are 2 mm and reactive to light bilaterally.  Discs are flat. The patient has difficulty producing conjugate gaze to the right.  He can look to the left.  He has decreased blink on the right, present on the left.  The patient has somewhat garbled speech and at times seems to be slightly drowsy, but will alert easily.  RESPIRATORY: Clear to auscultation and percussion.  CARDIOVASCULAR: Regular rate and rhythm with no obvious murmurs or rubs noted.  EXTREMITIES: Without significant edema.  NEUROLOGIC: Cranial nerves as above.  Facial symmetry is not present, with some depression of the right nasolabial fold.  The patient again appears to miss objects in the right homonymous visual field, consistent with right homonymous visual field deficit.  The patient has good orientation to person, place, knows the month and year.  On examination he has a very definite right hemiparesis and does not have antigravity movement of  the right arm or leg. Reflexes are actually slightly depressed in the right arm and right leg compared to the left.  Upgoing toe noted on the right, neutral on the left. Cerebellar testing could not be performed.  The patient claims to have symmetric pinprick sensation throughout.  LABORATORY DATA: Laboratory values from Apr 08, 2000 reveal a hemoglobin of 13.2, hematocrit 39.5, MCV 88.5, platelets 264,000.  Coags unremarkable. Sodium 140, potassium 4.6, chloride 106, CO2 26, glucose 91, BUN 11, creatinine 0.9.  Calcium 9.1.  CT scan of the brain was unremarkable.  IMPRESSION:  1. New onset of left middle cerebral artery distribution stroke.  2.  History of coronary artery disease.  3. History of hypertension.  This patient has suffered a left brain stroke during coronary catheterization today.  Do need to consider the possibility of embolic stroke, but the patient has had very definite undulations in his symptomatology, and carotid dissection does need to be considered as well.  RECOMMENDATIONS: The patient is admitted to the intensive care unit and after discussion with Dr. Glean Hess the patient is not felt to be a candidate for arterial thrombolytic treatment.  Will treat her aggressively otherwise with IV fluids and heparin therapy.  PLAN:  1. Repeat 500 cc IV normal saline bolus.  2. Begin heparin therapy 1000 units per hour, no bolus; will have pharmacy     follow from there.  3. Will follow neurologic status.  May repeat CT scan of the head within the     next one to two days.  4. Carotid Doppler study to rule out carotid dissection.  5. Obtain 2D echocardiogram.  6. Down the road will consider an intracranial MRI angiogram to rule out     significant intracranial disease.  7. Will get the stroke team to see the patient.  8. NPO for now. DD:  04/14/00 TD:  04/15/00 Job: [id number]

## 2011-04-09 NOTE — Discharge Summary (Signed)
[name]. [facility]  Patient:    Edward [name], Edward [name]                        MRN: [id number] Adm. Date:  [id number] Disc. Date: [id number] Attending:  Daisey Must Dictator:   Rene Paci CC:         Tera Mater. Evlyn Kanner, M.D.                           Discharge Summary  DATE OF BIRTH: 1930/08/07  DISCHARGE DIAGNOSES:  1. Status post left thalamic infarct, improving.  2. Moderate three vessel coronary artery disease, verified with coronary     catheterization on Apr 14, 2000; no intervention.  3. Hypertension.  4. Hyperlipidemia.  5. Asthma.  DISCHARGE MEDICATIONS:  1. Plavix 75 mg q.d.  2. Lopressor 25 mg 1/2 tablet p.o. q.d.  3. Altace 2.5 mg p.o. q.d.  4. Accolate 20 mg 1 tablet p.o. b.i.d.  5. Lipitor 10 mg p.o. q.d.  6. Advair 250/50 1 puff q.12h.  7. Nasonex 2 puffs each nostril q.d.  PROCEDURES:  1. Coronary catheterization on Apr 14, 2000 by Dr. Gerri Spore, showing normal     left ventricular ejection fraction, moderate three vessel disease with     right coronary artery and left anterior descending nonobstructive     restenosis.  2. On Apr 14, 2000 the patient underwent bilateral carotid Dopplers, showing     40-60% left internal carotid artery stenosis, no significant right     internal carotid artery stenosis.  3. MRI of the brain on Apr 15, 2000 showed acute left thalamic infarct and     chronic small vessel ischemic disease changes of the white matter.  CONSULTATIONS: [facility].  HISTORY OF PRESENT ILLNESS: This patient is a 77 year old gentleman with a history of previous posterolateral MI and recent abnormal stress Cardiolite examination suggesting moderate peri-infarct ischemia, admitted for repeat catheterization and possible percutaneous intervention.  HOSPITAL COURSE:  During cardiac catheterization the patient became aphasic and lethargic.  The procedure was stopped and the catheter was removed. Medications were  given to reverse the Valium and Versed given for the procedure but the patient exhibited no change.  He improved shortly over the course but had remaining left facial droop and was complained of diplopia.  He also became confused and began to confabulate details of recent events.  A stat head CT was ordered that showed no acute bleed, and neurology was consulted.  The patient was begun on heparin for possible embolic event secondary to the catheterization and Dopplers were checked and found to be a noncontributing cause to the event.  A TEE was also ordered to evaluate for possible emboli and none was visualized.  The patient was monitored continuously in the ICU and found to have slow improvement over time without any further intervention.  His swallowing study was performed and found to be normal for thin liquids, and he was able to eat without difficulty.  An MRI of the head was performed on Apr 15, 2000 as listed above, showing an acute left thalamic infarct.  Neurology agreed that because of location of the infarct it was unlikely to be related to embolic events and heparin was tapered and the patient restarted on his Plavix secondary to aspirin allergy.  At the time of discharge the patient still has a moderate amount of slurring of his speech and  slowed thought processes, claiming to have decreased memory, but this is not exhibited on any formal examination.  Overall the patient feels that he is improving and is very anxious to return home.  FOLLOW-UP: He has instructions to call the [facility] office on Tuesday after the Memorial Day holiday to arrange a follow-up appointment with Dr. Gerri Spore or Dr. Eden Emms, and the patient agrees to such accordingly.  The patient will also be rescheduled for intervention as-needed at a future date. No medication changes have been made during his hospitalization and he will be sent home on the same regimen medically as which he was  admitted.DD:  04/17/00 TD:  04/18/00 Job: [id number]

## 2011-04-09 NOTE — Procedures (Signed)
[place]. [facility]  Patient:    Edward [name], Edward [name]                        MRN: [id number] Proc. Date: 04/14/00 Adm. Date:  [id number] Attending:  Daisey Must                           Procedure Report  PROCEDURE:  Transesophageal echocardiography.  CARDIOLOGIST:  Noralyn Pick. Eden Emms, M.D.  INDICATION:  Status post heart catheterization with stroke; question dissection of proximal aorta aorta.  ANESTHESIA:  Patient was sedated with 4 mg of Versed.  COMPLICATIONS:  Unfortunately, at the time of the study, the VCR tape malfunctioned and no hard copy was recorded.  This study was done at the bedside in the CCU.  DESCRIPTION OF PROCEDURE:  Transesophageal imaging revealed normal LV function with an EF greater than 60%.  There were no wall motion abnormalities.  Right ventricle was normal.  There was no left atrial appendix thrombus.  Mitral valve was functioning normally with mild MR.  Aortic valve was trileaflet and mildly sclerotic.  There was mild aortic insufficiency.  The aortic root was well-visualized with no evidence of aortic dissection and single outflow tract flow by color Doppler imaging of the descending aorta and arch did reveal some calcification.   The takeoff of the left carotid artery and subclavian were visualized and were widely patent.  There was no obvious aortic debris.  The intima near the aortic arch was somewhat thickened, with slightly more calcification than seen distally; however, there were no flaps.  FINAL IMPRESSION: 1. No evidence of aortic dissection or flap, with good visualization of the    aortic arch, takeoff of the left carotid subclavian and proximal ascending    aorta. 2. Mild aortic insufficiency with aortic valve sclerosis. 3. Mild mitral insufficiency. 4. Normal left ventricular function. 5. No left atrial appendage thrombus.  Patient tolerated the procedure well.  Since he has had a stroke, the Versed was  reversed with 0.5 mg of Romazicon.  Hemodynamics were stable during the procedure. DD:  04/14/00 TD:  04/17/00 Job: [id number]

## 2011-04-09 NOTE — Op Note (Signed)
NAME:  Edward [name], Edward [name]                           ACCOUNT NO.:  [id number]   MEDICAL RECORD NO.:  [id number]                   PATIENT TYPE:  AMB   LOCATION:  DSC                                  FACILITY:  [facility]   PHYSICIAN:  Lorre Munroe., M.D.            DATE OF BIRTH:  11-15-1930   DATE OF PROCEDURE:  10/22/2003  DATE OF DISCHARGE:                                 OPERATIVE REPORT   PREOPERATIVE DIAGNOSIS:  Indirect left inguinal hernia.   POSTOPERATIVE DIAGNOSIS:  Indirect left inguinal hernia.   OPERATION:  Repair of left inguinal hernia.   SURGEON:  Lebron Conners, M.D.   ANESTHESIA:  Local with sedation.   DESCRIPTION OF PROCEDURE:  After the patient was monitored  and sedated and  had routine prepping and draping of the left lower quadrant of the abdomen  and groin region, I liberally infused 0.5% bupivacaine in the skin and  subcutaneous tissues and deeper tissues of the left inguinal area. I made an  oblique incision beginning just at the pubic tubercle and going laterally  about 6 cm and dissected down through the subcutaneous fat  until I  encountered the external oblique aponeurosis.   I opened it in the direction of its fibers into the superficial inguinal  ring taking care to avoid injury to the ilioinguinal nerve. I then mobilized  the spermatic cord along with the ilioinguinal nerve  and encircled it with  a Penrose drain and then further  anesthetized the deeper tissues. I noticed  a slight weakness of the ilioinguinal floor but no direct hernia.   I then  dissected the proximal part of the spermatic cord and found a  sizeable indirect hernia sac which I separated from the vas deferens and  cord vessels. I then reduced it through the deep inguinal ring and plugged  the superior aspect of the ring with a plug of polypropylene mesh held in  place with a single 2-0 silk stitch.   I then fashioned a patch of polypropylene mesh and cut a slit in it to allow  the spermatic cord to exit into the scrotum and sewed that in with 2-0  Prolene suture from the pubic tubercle medially and superiorly with a  basting running stitch and laterally and inferiorly with a simple renal  suture in the shelving edge of the inguinal ligament. I used a single suture  to join the tails of the mesh together just lateral to the spermatic cord  and then added further local anesthetic.   Hemostasis was  good. All sponge, instrument and needle counts were correct.  I closed the external oblique and subcutaneous tissues with separate  layers  of running 3-0 Vicryl suture and I closed the skin with intracuticular 4-0  Vicryl suture and Steri-Strips. After application of a bandage the patient  went to the recovery room in satisfactory condition.  Lorre Munroe., M.D.    WB/MEDQ  D:  10/22/2003  T:  10/22/2003  Job:  [id number]   cc:   Jeannett Senior A. Evlyn Kanner, M.D.  [address]  [place]  [place] [address]  Fax: [phone]

## 2011-04-09 NOTE — Cardiovascular Report (Signed)
[place]. [facility]  Patient:    Edward [name], Edward [name]                        MRN: [id number] Proc. Date: 04/14/00 Adm. Date:  [id number] Attending:  Daisey Must CC:         Tera Mater. Evlyn Kanner, M.D.             Daisey Must, M.D. [facility]             Cardiac Catheterization Laboratory                        Cardiac Catheterization  PROCEDURES PERFORMED:  Left heart catheterization with coronary angiography and left ventriculography.  INDICATIONS:  Mr. Baughman is a 77 year old male with a history of a previous lateral wall myocardial infarction in November of 2000.  He was treated with stent to the left circumflex.  He then had a staged intervention with a stent placed in his LAD in December.  He represented in March of this year with unstable angina and a 99% in-stent re-stenosis in the left circumflex.  This was treated with PTCA by Dr. Juanda Chance.  We did a followup stress Cardiolite to rule out recurrent ischemia.  This showed an area of infarction in the lateral wall with moderate peri-infarct ischemia.  We thus opted to treat with cardiac catheterization.  DESCRIPTION OF PROCEDURE:  A 6 French sheath was placed in the right femoral artery.  We used Standard Judkins 6 French catheters for the diagnostic catheterization.  Contrast was Omnipaque.  He was found to have moderate restenosis in the left circumflex just proximal to the stent.  We therefore may preparations to proceed with percutaneous intervention of the left circumflex.  We exchanged the 6 French sheath in the right femoral artery for a 7 French sheath.  Heparin was administered per protocol.  We initially advanced a Voda left 3.5 guiding catheter.  This did engage the left main ostium but not reliably.  We therefore changed to a Voda left 3.0 guiding catheter which we were able to engage in the left coronary ostium.  However, at that point, the patient suddenly became confused, somnolent and  mildly aphasic.  We therefore stopped the procedure and removed the guiding catheter. We observation the patient improved to where he was alert and oriented and moving all extremities; however, had mild residual facial droop on the right. However, several minutes later, the patients condition deteriorated to where he became very lethargic and somnolent and not following commands.  He had a more prominent right facial droop.  We placed a Perclose vascular closure device in the right femoral artery and the patient was sent for emergent head CT scan.  Neurology consultation was also obtained.  CATHETERIZATION RESULTS:  HEMODYNAMICS:  Left ventricular pressure 115/18, aortic pressure 115/58.  No aortic valve gradient.  LEFT VENTRICULOGRAM:  Left ventriculogram wall motion is normal.  Ejection fraction is greater than 60%.  CORONARY ARTERIOGRAPHY:  (Right dominant).  Left main:  Left main has an ostial 25% stenosis.  Left anterior descending:  The LAD has a 50% stenosis in the proximal vessel. The mid vessel has a 25% followed by a diffuse 40-50% in-stent stenosis in the mid LAD.  There is a large first diagonal branch with a 40% stenosis at its origin.  Left circumflex:  The left circumflex has a 30% stenosis proximally.  Just proximal to the stent  in the mid vessel is a 70% stenosis.  Within the stent itself is a 25% stenosis.  More distally in a large OM-2 is a 50% stenosis.  Right coronary artery:  The right coronary artery has a proximal 25% stenosis and diffuse 20% in the mid vessel.  The distal vessel was ectatic.  In the very distal vessel just proximal to a second posterolateral branch is a 50% stenosis.  There is a normal sized posterior descending artery, a small first posterolateral branch and a normal second posterolateral branch.  IMPRESSION: 1. Preserved left ventricular systolic function with significant recovery of    left ventricular function from his previous  myocardial infarction. 2. Moderate three-vessel coronary artery disease with nonobstructive    disease in the right coronary artery and left anterior descending artery. 3. Restenosis in the left circumflex just proximal to the stent.  These images were reviewed by Dr. Riley Kill.  We both felt it was prudent to proceed with percutaneous intervention.  However, due to the probable cerebrovascular accident complicating catheterization, the procedure was aborted and no intervention was performed.  COMPLICATIONS:  Apparent cerebrovascular accident, suspect embolic in origin, rule out bleed.  PLAN:  Stat head CT scan will be performed.  Neurology has been consulted. DD:  04/14/00 TD:  04/17/00 Job: [id number]

## 2011-04-13 ENCOUNTER — Encounter (HOSPITAL_COMMUNITY): Payer: Self-pay

## 2011-04-13 ENCOUNTER — Ambulatory Visit (HOSPITAL_COMMUNITY): Payer: Self-pay

## 2011-04-15 ENCOUNTER — Encounter (HOSPITAL_COMMUNITY): Payer: Self-pay

## 2011-04-15 ENCOUNTER — Ambulatory Visit (HOSPITAL_COMMUNITY): Payer: Self-pay

## 2011-04-20 ENCOUNTER — Encounter (HOSPITAL_COMMUNITY): Payer: Self-pay

## 2011-04-20 ENCOUNTER — Ambulatory Visit (HOSPITAL_COMMUNITY): Payer: Self-pay

## 2011-04-22 ENCOUNTER — Ambulatory Visit (HOSPITAL_COMMUNITY): Payer: Self-pay

## 2011-04-22 ENCOUNTER — Encounter (HOSPITAL_COMMUNITY): Payer: Self-pay

## 2011-04-27 ENCOUNTER — Ambulatory Visit (HOSPITAL_COMMUNITY): Payer: Self-pay

## 2011-04-27 ENCOUNTER — Encounter (HOSPITAL_COMMUNITY): Payer: Self-pay | Attending: Cardiovascular Disease

## 2011-04-27 ENCOUNTER — Encounter (HOSPITAL_COMMUNITY): Payer: Self-pay

## 2011-04-27 DIAGNOSIS — I2510 Atherosclerotic heart disease of native coronary artery without angina pectoris: Secondary | ICD-10-CM | POA: Insufficient documentation

## 2011-04-27 DIAGNOSIS — Z9861 Coronary angioplasty status: Secondary | ICD-10-CM | POA: Insufficient documentation

## 2011-04-27 DIAGNOSIS — Z8249 Family history of ischemic heart disease and other diseases of the circulatory system: Secondary | ICD-10-CM | POA: Insufficient documentation

## 2011-04-27 DIAGNOSIS — E785 Hyperlipidemia, unspecified: Secondary | ICD-10-CM | POA: Insufficient documentation

## 2011-04-27 DIAGNOSIS — Z5189 Encounter for other specified aftercare: Secondary | ICD-10-CM | POA: Insufficient documentation

## 2011-04-29 ENCOUNTER — Encounter (HOSPITAL_COMMUNITY): Payer: Self-pay

## 2011-04-29 ENCOUNTER — Ambulatory Visit (HOSPITAL_COMMUNITY): Payer: Self-pay

## 2011-05-04 ENCOUNTER — Ambulatory Visit (HOSPITAL_COMMUNITY): Payer: Self-pay

## 2011-05-04 ENCOUNTER — Encounter (HOSPITAL_COMMUNITY): Payer: Self-pay

## 2011-05-06 ENCOUNTER — Encounter (HOSPITAL_COMMUNITY): Payer: Self-pay

## 2011-05-06 ENCOUNTER — Ambulatory Visit (HOSPITAL_COMMUNITY): Payer: Self-pay

## 2011-05-11 ENCOUNTER — Encounter (HOSPITAL_COMMUNITY): Payer: Self-pay

## 2011-05-11 ENCOUNTER — Ambulatory Visit (HOSPITAL_COMMUNITY): Payer: Self-pay

## 2011-05-13 ENCOUNTER — Ambulatory Visit (HOSPITAL_COMMUNITY): Payer: Self-pay

## 2011-05-13 ENCOUNTER — Encounter (HOSPITAL_COMMUNITY): Payer: Self-pay

## 2011-05-18 ENCOUNTER — Ambulatory Visit (HOSPITAL_COMMUNITY): Payer: Self-pay

## 2011-05-18 ENCOUNTER — Encounter (HOSPITAL_COMMUNITY): Payer: Self-pay

## 2011-05-20 ENCOUNTER — Encounter (HOSPITAL_COMMUNITY): Payer: Self-pay

## 2011-05-20 ENCOUNTER — Ambulatory Visit (HOSPITAL_COMMUNITY): Payer: Self-pay

## 2011-05-25 ENCOUNTER — Ambulatory Visit (HOSPITAL_COMMUNITY): Payer: Self-pay

## 2011-05-25 ENCOUNTER — Encounter (HOSPITAL_COMMUNITY): Payer: Self-pay

## 2011-05-25 ENCOUNTER — Encounter (HOSPITAL_COMMUNITY): Payer: Self-pay | Attending: Cardiovascular Disease

## 2011-05-25 DIAGNOSIS — I2510 Atherosclerotic heart disease of native coronary artery without angina pectoris: Secondary | ICD-10-CM | POA: Insufficient documentation

## 2011-05-25 DIAGNOSIS — Z8249 Family history of ischemic heart disease and other diseases of the circulatory system: Secondary | ICD-10-CM | POA: Insufficient documentation

## 2011-05-25 DIAGNOSIS — Z5189 Encounter for other specified aftercare: Secondary | ICD-10-CM | POA: Insufficient documentation

## 2011-05-25 DIAGNOSIS — E785 Hyperlipidemia, unspecified: Secondary | ICD-10-CM | POA: Insufficient documentation

## 2011-05-25 DIAGNOSIS — Z9861 Coronary angioplasty status: Secondary | ICD-10-CM | POA: Insufficient documentation

## 2011-05-27 ENCOUNTER — Encounter (HOSPITAL_COMMUNITY): Payer: Self-pay

## 2011-05-27 ENCOUNTER — Ambulatory Visit (HOSPITAL_COMMUNITY): Payer: Self-pay

## 2011-05-28 ENCOUNTER — Encounter: Payer: Self-pay | Admitting: Cardiovascular Disease

## 2011-06-01 ENCOUNTER — Encounter (HOSPITAL_COMMUNITY): Payer: Self-pay

## 2011-06-01 ENCOUNTER — Ambulatory Visit (HOSPITAL_COMMUNITY): Payer: Self-pay

## 2011-06-03 ENCOUNTER — Encounter (HOSPITAL_COMMUNITY): Payer: Self-pay

## 2011-06-03 ENCOUNTER — Ambulatory Visit (HOSPITAL_COMMUNITY): Payer: Self-pay

## 2011-06-08 ENCOUNTER — Encounter (HOSPITAL_COMMUNITY): Payer: Self-pay

## 2011-06-08 ENCOUNTER — Ambulatory Visit (HOSPITAL_COMMUNITY): Payer: Self-pay

## 2011-06-10 ENCOUNTER — Encounter (HOSPITAL_COMMUNITY): Payer: Self-pay

## 2011-06-10 ENCOUNTER — Ambulatory Visit (HOSPITAL_COMMUNITY): Payer: Self-pay

## 2011-06-15 ENCOUNTER — Encounter (HOSPITAL_COMMUNITY): Payer: Self-pay

## 2011-06-15 ENCOUNTER — Ambulatory Visit (HOSPITAL_COMMUNITY): Payer: Self-pay

## 2011-06-17 ENCOUNTER — Ambulatory Visit (HOSPITAL_COMMUNITY): Payer: Self-pay

## 2011-06-17 ENCOUNTER — Encounter (HOSPITAL_COMMUNITY): Payer: Self-pay

## 2011-06-22 ENCOUNTER — Encounter (HOSPITAL_COMMUNITY): Payer: Self-pay

## 2011-06-22 ENCOUNTER — Ambulatory Visit (HOSPITAL_COMMUNITY): Payer: Self-pay

## 2011-06-24 ENCOUNTER — Encounter (HOSPITAL_COMMUNITY): Payer: Self-pay

## 2011-06-24 ENCOUNTER — Encounter (HOSPITAL_COMMUNITY): Payer: Self-pay | Attending: Cardiovascular Disease

## 2011-06-24 ENCOUNTER — Ambulatory Visit (HOSPITAL_COMMUNITY): Payer: Self-pay

## 2011-06-24 DIAGNOSIS — I2510 Atherosclerotic heart disease of native coronary artery without angina pectoris: Secondary | ICD-10-CM | POA: Insufficient documentation

## 2011-06-24 DIAGNOSIS — Z8249 Family history of ischemic heart disease and other diseases of the circulatory system: Secondary | ICD-10-CM | POA: Insufficient documentation

## 2011-06-24 DIAGNOSIS — Z5189 Encounter for other specified aftercare: Secondary | ICD-10-CM | POA: Insufficient documentation

## 2011-06-24 DIAGNOSIS — E785 Hyperlipidemia, unspecified: Secondary | ICD-10-CM | POA: Insufficient documentation

## 2011-06-24 DIAGNOSIS — Z9861 Coronary angioplasty status: Secondary | ICD-10-CM | POA: Insufficient documentation

## 2011-06-29 ENCOUNTER — Ambulatory Visit (HOSPITAL_COMMUNITY): Payer: Self-pay

## 2011-06-29 ENCOUNTER — Encounter (HOSPITAL_COMMUNITY): Payer: Self-pay

## 2011-07-01 ENCOUNTER — Ambulatory Visit (HOSPITAL_COMMUNITY): Payer: Self-pay

## 2011-07-01 ENCOUNTER — Encounter (HOSPITAL_COMMUNITY): Payer: Self-pay

## 2011-07-06 ENCOUNTER — Encounter (HOSPITAL_COMMUNITY): Payer: Self-pay

## 2011-07-06 ENCOUNTER — Ambulatory Visit (HOSPITAL_COMMUNITY): Payer: Self-pay

## 2011-07-08 ENCOUNTER — Ambulatory Visit (HOSPITAL_COMMUNITY): Payer: Self-pay

## 2011-07-08 ENCOUNTER — Encounter (HOSPITAL_COMMUNITY): Payer: Self-pay

## 2011-07-13 ENCOUNTER — Encounter (HOSPITAL_COMMUNITY): Payer: Self-pay

## 2011-07-13 ENCOUNTER — Ambulatory Visit (HOSPITAL_COMMUNITY): Payer: Self-pay

## 2011-07-15 ENCOUNTER — Encounter (HOSPITAL_COMMUNITY): Payer: Self-pay

## 2011-07-15 ENCOUNTER — Ambulatory Visit (HOSPITAL_COMMUNITY): Payer: Self-pay

## 2011-07-20 ENCOUNTER — Encounter (HOSPITAL_COMMUNITY): Payer: Self-pay

## 2011-07-20 ENCOUNTER — Ambulatory Visit (HOSPITAL_COMMUNITY): Payer: Self-pay

## 2011-07-22 ENCOUNTER — Encounter (HOSPITAL_COMMUNITY): Payer: Self-pay

## 2011-07-22 ENCOUNTER — Ambulatory Visit (HOSPITAL_COMMUNITY): Payer: Self-pay

## 2011-07-27 ENCOUNTER — Ambulatory Visit (HOSPITAL_COMMUNITY): Payer: Self-pay

## 2011-07-27 ENCOUNTER — Encounter (HOSPITAL_COMMUNITY): Payer: Self-pay

## 2011-07-27 ENCOUNTER — Encounter (HOSPITAL_COMMUNITY): Payer: Self-pay | Attending: Cardiovascular Disease

## 2011-07-27 DIAGNOSIS — Z8249 Family history of ischemic heart disease and other diseases of the circulatory system: Secondary | ICD-10-CM | POA: Insufficient documentation

## 2011-07-27 DIAGNOSIS — Z9861 Coronary angioplasty status: Secondary | ICD-10-CM | POA: Insufficient documentation

## 2011-07-27 DIAGNOSIS — I2510 Atherosclerotic heart disease of native coronary artery without angina pectoris: Secondary | ICD-10-CM | POA: Insufficient documentation

## 2011-07-27 DIAGNOSIS — E785 Hyperlipidemia, unspecified: Secondary | ICD-10-CM | POA: Insufficient documentation

## 2011-07-27 DIAGNOSIS — Z5189 Encounter for other specified aftercare: Secondary | ICD-10-CM | POA: Insufficient documentation

## 2011-07-29 ENCOUNTER — Encounter (HOSPITAL_COMMUNITY): Payer: Self-pay

## 2011-07-29 ENCOUNTER — Ambulatory Visit (HOSPITAL_COMMUNITY): Payer: Self-pay

## 2011-08-03 ENCOUNTER — Encounter (HOSPITAL_COMMUNITY): Payer: Self-pay

## 2011-08-03 ENCOUNTER — Ambulatory Visit (HOSPITAL_COMMUNITY): Payer: Self-pay

## 2011-08-05 ENCOUNTER — Encounter (HOSPITAL_COMMUNITY): Payer: Self-pay

## 2011-08-05 ENCOUNTER — Ambulatory Visit (HOSPITAL_COMMUNITY): Payer: Self-pay

## 2011-08-08 ENCOUNTER — Other Ambulatory Visit: Payer: Self-pay | Admitting: Cardiovascular Disease

## 2011-08-10 ENCOUNTER — Ambulatory Visit (HOSPITAL_COMMUNITY): Payer: Self-pay

## 2011-08-10 ENCOUNTER — Encounter (HOSPITAL_COMMUNITY): Payer: Self-pay

## 2011-08-11 ENCOUNTER — Other Ambulatory Visit: Payer: Self-pay | Admitting: Cardiovascular Disease

## 2011-08-12 ENCOUNTER — Ambulatory Visit (HOSPITAL_COMMUNITY): Payer: Self-pay

## 2011-08-12 ENCOUNTER — Encounter (HOSPITAL_COMMUNITY): Payer: Self-pay

## 2011-08-17 ENCOUNTER — Encounter (HOSPITAL_COMMUNITY): Payer: Self-pay

## 2011-08-17 ENCOUNTER — Ambulatory Visit (HOSPITAL_COMMUNITY): Payer: Self-pay

## 2011-08-19 ENCOUNTER — Encounter (HOSPITAL_COMMUNITY): Payer: Self-pay

## 2011-08-19 ENCOUNTER — Ambulatory Visit (HOSPITAL_COMMUNITY): Payer: Self-pay

## 2011-08-24 ENCOUNTER — Encounter (HOSPITAL_COMMUNITY): Payer: Self-pay

## 2011-08-24 ENCOUNTER — Ambulatory Visit (HOSPITAL_COMMUNITY): Payer: Self-pay

## 2011-08-24 ENCOUNTER — Encounter (HOSPITAL_COMMUNITY): Payer: Self-pay | Attending: Cardiovascular Disease

## 2011-08-24 DIAGNOSIS — I2510 Atherosclerotic heart disease of native coronary artery without angina pectoris: Secondary | ICD-10-CM | POA: Insufficient documentation

## 2011-08-24 DIAGNOSIS — Z5189 Encounter for other specified aftercare: Secondary | ICD-10-CM | POA: Insufficient documentation

## 2011-08-24 DIAGNOSIS — Z8249 Family history of ischemic heart disease and other diseases of the circulatory system: Secondary | ICD-10-CM | POA: Insufficient documentation

## 2011-08-24 DIAGNOSIS — E785 Hyperlipidemia, unspecified: Secondary | ICD-10-CM | POA: Insufficient documentation

## 2011-08-24 DIAGNOSIS — Z9861 Coronary angioplasty status: Secondary | ICD-10-CM | POA: Insufficient documentation

## 2011-08-26 ENCOUNTER — Encounter (HOSPITAL_COMMUNITY): Payer: Self-pay

## 2011-08-26 ENCOUNTER — Ambulatory Visit (HOSPITAL_COMMUNITY): Payer: Self-pay

## 2011-08-31 ENCOUNTER — Ambulatory Visit (HOSPITAL_COMMUNITY): Payer: Self-pay

## 2011-08-31 ENCOUNTER — Encounter (HOSPITAL_COMMUNITY): Payer: Self-pay

## 2011-09-02 ENCOUNTER — Ambulatory Visit (HOSPITAL_COMMUNITY): Payer: Self-pay

## 2011-09-02 ENCOUNTER — Encounter (HOSPITAL_COMMUNITY): Payer: Self-pay

## 2011-09-07 ENCOUNTER — Ambulatory Visit (HOSPITAL_COMMUNITY): Payer: Self-pay

## 2011-09-07 ENCOUNTER — Encounter (HOSPITAL_COMMUNITY): Payer: Self-pay

## 2011-09-09 ENCOUNTER — Encounter (HOSPITAL_COMMUNITY): Payer: Self-pay

## 2011-09-09 ENCOUNTER — Ambulatory Visit (HOSPITAL_COMMUNITY): Payer: Self-pay

## 2011-09-14 ENCOUNTER — Encounter (HOSPITAL_COMMUNITY): Payer: Self-pay

## 2011-09-14 ENCOUNTER — Ambulatory Visit (HOSPITAL_COMMUNITY): Payer: Self-pay

## 2011-09-16 ENCOUNTER — Encounter (HOSPITAL_COMMUNITY): Payer: Self-pay

## 2011-09-16 ENCOUNTER — Ambulatory Visit (HOSPITAL_COMMUNITY): Payer: Self-pay

## 2011-09-21 ENCOUNTER — Ambulatory Visit (HOSPITAL_COMMUNITY): Payer: Self-pay

## 2011-09-21 ENCOUNTER — Encounter (HOSPITAL_COMMUNITY): Payer: Self-pay

## 2011-09-23 ENCOUNTER — Ambulatory Visit (HOSPITAL_COMMUNITY): Payer: Self-pay

## 2011-09-23 ENCOUNTER — Encounter (HOSPITAL_COMMUNITY): Payer: Self-pay

## 2011-09-23 DIAGNOSIS — Z9861 Coronary angioplasty status: Secondary | ICD-10-CM | POA: Insufficient documentation

## 2011-09-23 DIAGNOSIS — I2510 Atherosclerotic heart disease of native coronary artery without angina pectoris: Secondary | ICD-10-CM | POA: Insufficient documentation

## 2011-09-23 DIAGNOSIS — Z8249 Family history of ischemic heart disease and other diseases of the circulatory system: Secondary | ICD-10-CM | POA: Insufficient documentation

## 2011-09-23 DIAGNOSIS — Z5189 Encounter for other specified aftercare: Secondary | ICD-10-CM | POA: Insufficient documentation

## 2011-09-23 DIAGNOSIS — E785 Hyperlipidemia, unspecified: Secondary | ICD-10-CM | POA: Insufficient documentation

## 2011-09-28 ENCOUNTER — Ambulatory Visit (HOSPITAL_COMMUNITY): Payer: Self-pay

## 2011-09-28 ENCOUNTER — Encounter (HOSPITAL_COMMUNITY): Payer: Self-pay

## 2011-09-30 ENCOUNTER — Ambulatory Visit (HOSPITAL_COMMUNITY): Payer: Self-pay

## 2011-09-30 ENCOUNTER — Encounter (HOSPITAL_COMMUNITY): Payer: Self-pay

## 2011-10-05 ENCOUNTER — Ambulatory Visit (HOSPITAL_COMMUNITY): Payer: Self-pay

## 2011-10-05 ENCOUNTER — Encounter (HOSPITAL_COMMUNITY): Payer: Self-pay

## 2011-10-07 ENCOUNTER — Encounter (HOSPITAL_COMMUNITY): Payer: Self-pay

## 2011-10-07 ENCOUNTER — Ambulatory Visit (HOSPITAL_COMMUNITY): Payer: Self-pay

## 2011-10-12 ENCOUNTER — Ambulatory Visit (HOSPITAL_COMMUNITY): Payer: Self-pay

## 2011-10-12 ENCOUNTER — Encounter (HOSPITAL_COMMUNITY): Payer: Self-pay

## 2011-10-12 ENCOUNTER — Encounter (HOSPITAL_COMMUNITY)
Admission: RE | Admit: 2011-10-12 | Discharge: 2011-10-12 | Disposition: A | Discharge status: Home / Self Care | Payer: Self-pay | Source: Ambulatory Visit | Attending: Cardiovascular Disease | Admitting: Cardiovascular Disease

## 2011-10-14 ENCOUNTER — Encounter (HOSPITAL_COMMUNITY): Payer: Self-pay

## 2011-10-14 ENCOUNTER — Ambulatory Visit (HOSPITAL_COMMUNITY): Payer: Self-pay

## 2011-10-19 ENCOUNTER — Ambulatory Visit (HOSPITAL_COMMUNITY): Payer: Self-pay

## 2011-10-19 ENCOUNTER — Encounter (HOSPITAL_COMMUNITY): Payer: Self-pay

## 2011-10-19 ENCOUNTER — Encounter (HOSPITAL_COMMUNITY)
Admission: RE | Admit: 2011-10-19 | Discharge: 2011-10-19 | Disposition: A | Discharge status: Home / Self Care | Payer: Self-pay | Source: Ambulatory Visit | Attending: Cardiovascular Disease | Admitting: Cardiovascular Disease

## 2011-10-21 ENCOUNTER — Ambulatory Visit (HOSPITAL_COMMUNITY): Payer: Self-pay

## 2011-10-21 ENCOUNTER — Encounter (HOSPITAL_COMMUNITY)
Admission: RE | Admit: 2011-10-21 | Discharge: 2011-10-21 | Disposition: A | Discharge status: Home / Self Care | Payer: Self-pay | Source: Ambulatory Visit | Attending: Cardiovascular Disease | Admitting: Cardiovascular Disease

## 2011-10-21 ENCOUNTER — Encounter (HOSPITAL_COMMUNITY): Payer: Self-pay

## 2011-10-26 ENCOUNTER — Encounter (HOSPITAL_COMMUNITY): Payer: Self-pay

## 2011-10-26 ENCOUNTER — Encounter (HOSPITAL_COMMUNITY)
Admission: RE | Admit: 2011-10-26 | Discharge: 2011-10-26 | Disposition: A | Discharge status: Home / Self Care | Payer: Self-pay | Source: Ambulatory Visit | Attending: Cardiovascular Disease | Admitting: Cardiovascular Disease

## 2011-10-26 ENCOUNTER — Ambulatory Visit (HOSPITAL_COMMUNITY): Payer: Self-pay

## 2011-10-26 DIAGNOSIS — Z9861 Coronary angioplasty status: Secondary | ICD-10-CM | POA: Insufficient documentation

## 2011-10-26 DIAGNOSIS — Z5189 Encounter for other specified aftercare: Secondary | ICD-10-CM | POA: Insufficient documentation

## 2011-10-26 DIAGNOSIS — I2510 Atherosclerotic heart disease of native coronary artery without angina pectoris: Secondary | ICD-10-CM | POA: Insufficient documentation

## 2011-10-26 DIAGNOSIS — E785 Hyperlipidemia, unspecified: Secondary | ICD-10-CM | POA: Insufficient documentation

## 2011-10-26 DIAGNOSIS — Z8249 Family history of ischemic heart disease and other diseases of the circulatory system: Secondary | ICD-10-CM | POA: Insufficient documentation

## 2011-10-28 ENCOUNTER — Encounter (HOSPITAL_COMMUNITY): Payer: Self-pay

## 2011-10-28 ENCOUNTER — Ambulatory Visit (HOSPITAL_COMMUNITY): Payer: Self-pay

## 2011-11-02 ENCOUNTER — Encounter (HOSPITAL_COMMUNITY)
Admission: RE | Admit: 2011-11-02 | Discharge: 2011-11-02 | Disposition: A | Discharge status: Home / Self Care | Payer: Self-pay | Source: Ambulatory Visit | Attending: Cardiovascular Disease | Admitting: Cardiovascular Disease

## 2011-11-02 ENCOUNTER — Encounter (HOSPITAL_COMMUNITY): Payer: Self-pay

## 2011-11-02 ENCOUNTER — Ambulatory Visit (HOSPITAL_COMMUNITY): Payer: Self-pay

## 2011-11-04 ENCOUNTER — Encounter (HOSPITAL_COMMUNITY): Payer: Self-pay

## 2011-11-04 ENCOUNTER — Encounter (HOSPITAL_COMMUNITY)
Admission: RE | Admit: 2011-11-04 | Discharge: 2011-11-04 | Disposition: A | Discharge status: Home / Self Care | Payer: Self-pay | Source: Ambulatory Visit | Attending: Cardiovascular Disease | Admitting: Cardiovascular Disease

## 2011-11-04 ENCOUNTER — Ambulatory Visit (HOSPITAL_COMMUNITY): Payer: Self-pay

## 2011-11-09 ENCOUNTER — Ambulatory Visit (HOSPITAL_COMMUNITY): Payer: Self-pay

## 2011-11-09 ENCOUNTER — Encounter (HOSPITAL_COMMUNITY)
Admission: RE | Admit: 2011-11-09 | Discharge: 2011-11-09 | Disposition: A | Discharge status: Home / Self Care | Payer: Self-pay | Source: Ambulatory Visit | Attending: Cardiovascular Disease | Admitting: Cardiovascular Disease

## 2011-11-09 ENCOUNTER — Encounter (HOSPITAL_COMMUNITY): Payer: Self-pay

## 2011-11-11 ENCOUNTER — Encounter (HOSPITAL_COMMUNITY)
Admission: RE | Admit: 2011-11-11 | Discharge: 2011-11-11 | Disposition: A | Discharge status: Home / Self Care | Payer: Self-pay | Source: Ambulatory Visit | Attending: Cardiovascular Disease | Admitting: Cardiovascular Disease

## 2011-11-11 ENCOUNTER — Encounter (HOSPITAL_COMMUNITY): Payer: Self-pay

## 2011-11-11 ENCOUNTER — Ambulatory Visit (HOSPITAL_COMMUNITY): Payer: Self-pay

## 2011-11-16 ENCOUNTER — Encounter (HOSPITAL_COMMUNITY): Payer: Self-pay

## 2011-11-16 ENCOUNTER — Ambulatory Visit (HOSPITAL_COMMUNITY): Payer: Self-pay

## 2011-11-18 ENCOUNTER — Ambulatory Visit (HOSPITAL_COMMUNITY): Payer: Self-pay

## 2011-11-18 ENCOUNTER — Encounter (HOSPITAL_COMMUNITY): Payer: Self-pay

## 2011-11-23 ENCOUNTER — Encounter (HOSPITAL_COMMUNITY): Payer: Self-pay

## 2011-11-23 ENCOUNTER — Ambulatory Visit (HOSPITAL_COMMUNITY): Payer: Self-pay

## 2011-11-25 ENCOUNTER — Encounter (HOSPITAL_COMMUNITY)
Admission: RE | Admit: 2011-11-25 | Discharge: 2011-11-25 | Disposition: A | Discharge status: Home / Self Care | Payer: Self-pay | Source: Ambulatory Visit | Attending: Cardiovascular Disease | Admitting: Cardiovascular Disease

## 2011-11-25 ENCOUNTER — Ambulatory Visit (HOSPITAL_COMMUNITY): Payer: Self-pay

## 2011-11-25 ENCOUNTER — Encounter (HOSPITAL_COMMUNITY): Payer: Self-pay

## 2011-11-25 DIAGNOSIS — Z9861 Coronary angioplasty status: Secondary | ICD-10-CM | POA: Insufficient documentation

## 2011-11-25 DIAGNOSIS — I2510 Atherosclerotic heart disease of native coronary artery without angina pectoris: Secondary | ICD-10-CM | POA: Insufficient documentation

## 2011-11-25 DIAGNOSIS — Z5189 Encounter for other specified aftercare: Secondary | ICD-10-CM | POA: Insufficient documentation

## 2011-11-25 DIAGNOSIS — Z8249 Family history of ischemic heart disease and other diseases of the circulatory system: Secondary | ICD-10-CM | POA: Insufficient documentation

## 2011-11-25 DIAGNOSIS — E785 Hyperlipidemia, unspecified: Secondary | ICD-10-CM | POA: Insufficient documentation

## 2011-11-30 ENCOUNTER — Ambulatory Visit (HOSPITAL_COMMUNITY): Payer: Self-pay

## 2011-11-30 ENCOUNTER — Encounter (HOSPITAL_COMMUNITY)
Admission: RE | Admit: 2011-11-30 | Discharge: 2011-11-30 | Disposition: A | Discharge status: Home / Self Care | Payer: Self-pay | Source: Ambulatory Visit | Attending: Cardiovascular Disease | Admitting: Cardiovascular Disease

## 2011-11-30 ENCOUNTER — Encounter (HOSPITAL_COMMUNITY): Payer: Self-pay

## 2011-12-02 ENCOUNTER — Encounter (HOSPITAL_COMMUNITY)
Admission: RE | Admit: 2011-12-02 | Discharge: 2011-12-02 | Disposition: A | Discharge status: Home / Self Care | Payer: Self-pay | Source: Ambulatory Visit | Attending: Cardiovascular Disease | Admitting: Cardiovascular Disease

## 2011-12-02 ENCOUNTER — Ambulatory Visit (HOSPITAL_COMMUNITY): Payer: Self-pay

## 2011-12-02 ENCOUNTER — Encounter (HOSPITAL_COMMUNITY): Payer: Self-pay

## 2011-12-07 ENCOUNTER — Encounter (HOSPITAL_COMMUNITY)
Admission: RE | Admit: 2011-12-07 | Discharge: 2011-12-07 | Disposition: A | Discharge status: Home / Self Care | Payer: Medicare Other | Source: Ambulatory Visit | Attending: Cardiovascular Disease | Admitting: Cardiovascular Disease

## 2011-12-07 ENCOUNTER — Encounter (HOSPITAL_COMMUNITY): Payer: Self-pay

## 2011-12-07 ENCOUNTER — Ambulatory Visit (HOSPITAL_COMMUNITY): Payer: Self-pay

## 2011-12-08 ENCOUNTER — Encounter: Payer: Self-pay | Admitting: Cardiovascular Disease

## 2011-12-09 ENCOUNTER — Ambulatory Visit (INDEPENDENT_AMBULATORY_CARE_PROVIDER_SITE_OTHER): Payer: Medicare Other | Admitting: Cardiovascular Disease

## 2011-12-09 ENCOUNTER — Encounter (HOSPITAL_COMMUNITY): Payer: Self-pay

## 2011-12-09 ENCOUNTER — Ambulatory Visit (HOSPITAL_COMMUNITY): Payer: Self-pay

## 2011-12-09 ENCOUNTER — Encounter: Payer: Self-pay | Admitting: Cardiovascular Disease

## 2011-12-09 VITALS — BP 140/59 | HR 57 | Ht 64.0 in | Wt 110.0 lb

## 2011-12-09 DIAGNOSIS — I340 Nonrheumatic mitral (valve) insufficiency: Secondary | ICD-10-CM

## 2011-12-09 DIAGNOSIS — I2510 Atherosclerotic heart disease of native coronary artery without angina pectoris: Secondary | ICD-10-CM

## 2011-12-09 DIAGNOSIS — I059 Rheumatic mitral valve disease, unspecified: Secondary | ICD-10-CM

## 2011-12-09 NOTE — Patient Instructions (Signed)
Your physician wants you to follow-up in: 12 months.  You will receive a reminder letter in the mail two months in advance. If you don't receive a letter, please call our office to schedule the follow-up appointment.  Your physician has requested that you have an echocardiogram. Echocardiography is a painless test that uses sound waves to create images of your heart. It provides your doctor with information about the size and shape of your heart and how well your heart's chambers and valves are working. This procedure takes approximately one hour. There are no restrictions for this procedure.    Your physician recommends that you continue on your current medications as directed. Please refer to the Current Medication list given to you today.

## 2011-12-09 NOTE — Assessment & Plan Note (Signed)
Stable. No changes. Continue medical therapy.

## 2011-12-09 NOTE — Assessment & Plan Note (Signed)
Moderate by echo in 2011. Will get repeat echo to assess.

## 2011-12-09 NOTE — Progress Notes (Signed)
History of Present Illness:  The patient is 78 yo male with history of CAD, HTN, HL, mild aortic valve stenosis,  and COPD. He has been previously followed by Dr Juanda Chance. He is here today fro cardiac follow up.  In 2000 he had a lateral MI and was treated with a bare-metal stent to the circumflex artery. He had staged bare-metal stent to the LAD. He later developed in-stent restenosis of the circumflex artery and required repeat PCI. His last catheterization was in 2001 and this was complicated by a stroke from which he separately recovered. At that time he had 70% in-stent restenosis but no further treatment was performed.   He tells me that he has been feeling well. He has had no chest pains. He does his yard work. He still attends cardiac rehab at [facility] for the last ten years. He is tolerating this well. His breathing has been normal. His exercise tolerance is good.   His primary care is Dr. Adrian Prince. His lipids are followed in primary care.   Past Medical History  Diagnosis Date  . Adenomatous colon polyp   . Internal hemorrhoids   . Diverticulosis   . Hypertension   . COPD (chronic obstructive pulmonary disease)   . Aortic stenosis, mild   . Hyperlipidemia   . CAD (coronary artery disease)     Status post prior myocardial infarction and multiple percutaneous interventions as described above nonobstructive disease at last cath and low-risk Myoview scan in 2007  . Stroke     Paraprocedural stoke with little residual following diagnostic catheterization, good LV function  . Aortic stenosis, mild   . Hypertension   . Hyperlipidemia   . COPD (chronic obstructive pulmonary disease)     Past Surgical History  Procedure Date  . Shoulder surgery     Left  . Inguinal hernia repair     Left  . Knee surgery     Left  . Nasal sinus surgery   . Tonsillectomy     Current Outpatient Prescriptions  Medication Sig Dispense Refill  . atorvastatin (LIPITOR) 40 MG tablet Take 40  mg by mouth daily.        . clopidogrel (PLAVIX) 75 MG tablet Take 1 tablet (75 mg total) by mouth daily.  30 tablet  11  . Fluticasone-Salmeterol (ADVAIR DISKUS) 250-50 MCG/DOSE AEPB Inhale 1 puff into the lungs every 12 (twelve) hours.      . metoprolol (LOPRESSOR) 50 MG tablet TAKE 1/2 TABLET BY MOUTH TWICE A DAY  30 tablet  12  . mometasone (NASONEX) 50 MCG/ACT nasal spray Place 2 sprays into the nose daily.        . Multiple Vitamin (MULTIVITAMIN) tablet Take 1 tablet by mouth daily.        . Multiple Vitamins-Calcium (VIACTIV MULTI-VITAMIN) CHEW Chew 1 tablet by mouth daily.        . nitroGLYCERIN (NITROSTAT) 0.4 MG SL tablet Place 0.4 mg under the tongue every 5 (five) minutes as needed.        . Omega-3 Fatty Acids (FISH OIL) 1000 MG CAPS Take 1 capsule by mouth every other day.        . ramipril (ALTACE) 2.5 MG capsule TAKE ONE TABLET BY MOUTH ONCE DAILY.  30 capsule  4  . zafirlukast (ACCOLATE) 20 MG tablet Take 20 mg by mouth daily.          Allergies  Allergen Reactions  . Aspirin     History  Social History  . Marital Status: Married    Spouse Name: N/A    Number of Children: 1  . Years of Education: N/A   Occupational History  . Retired [employer]    44 Years   Social History Main Topics  . Smoking status: Never Smoker   . Smokeless tobacco: Not on file  . Alcohol Use: Yes     1 glass red wine every other day  . Drug Use: No  . Sexually Active: Not on file   Other Topics Concern  . Not on file   Social History Narrative  . No narrative on file    Family History  Problem Relation Age of Onset  . Coronary artery disease Neg Hx     Review of Systems:  As stated in the HPI and otherwise negative.   BP 140/59  Pulse 57  Ht 5\' 4"  (1.626 m)  Wt 110 lb (49.896 kg)  BMI 18.88 kg/m2  Physical Examination: General: Well developed, well nourished, NAD HEENT: OP clear, mucus membranes moist SKIN: warm, dry. No rashes. Neuro: No focal  deficits Musculoskeletal: Muscle strength 5/5 all ext Psychiatric: Mood and affect normal Neck: No JVD, no carotid bruits, no thyromegaly, no lymphadenopathy. Lungs:Clear bilaterally, no wheezes, rhonci, crackles Cardiovascular: Regular rate and rhythm. No murmurs, gallops or rubs. Abdomen:Soft. Bowel sounds present. Non-tender.  Extremities: No lower extremity edema. Pulses are 2 + in the bilateral DP/PT.  NWG:NFAOZ bradycardia, rate 56 bpm. Incomplete RBBB.

## 2011-12-14 ENCOUNTER — Encounter (HOSPITAL_COMMUNITY): Payer: Self-pay

## 2011-12-14 ENCOUNTER — Encounter (HOSPITAL_COMMUNITY)
Admission: RE | Admit: 2011-12-14 | Discharge: 2011-12-14 | Disposition: A | Discharge status: Home / Self Care | Payer: Self-pay | Source: Ambulatory Visit | Attending: Cardiovascular Disease | Admitting: Cardiovascular Disease

## 2011-12-16 ENCOUNTER — Encounter (HOSPITAL_COMMUNITY): Payer: Self-pay

## 2011-12-16 ENCOUNTER — Encounter (HOSPITAL_COMMUNITY): Payer: Medicare Other

## 2011-12-20 ENCOUNTER — Ambulatory Visit (HOSPITAL_COMMUNITY): Payer: Medicare Other | Attending: Cardiology | Admitting: Radiology

## 2011-12-20 DIAGNOSIS — I359 Nonrheumatic aortic valve disorder, unspecified: Secondary | ICD-10-CM

## 2011-12-20 DIAGNOSIS — I252 Old myocardial infarction: Secondary | ICD-10-CM | POA: Insufficient documentation

## 2011-12-20 DIAGNOSIS — J449 Chronic obstructive pulmonary disease, unspecified: Secondary | ICD-10-CM | POA: Insufficient documentation

## 2011-12-20 DIAGNOSIS — J4489 Other specified chronic obstructive pulmonary disease: Secondary | ICD-10-CM | POA: Insufficient documentation

## 2011-12-20 DIAGNOSIS — I059 Rheumatic mitral valve disease, unspecified: Secondary | ICD-10-CM

## 2011-12-20 DIAGNOSIS — I340 Nonrheumatic mitral (valve) insufficiency: Secondary | ICD-10-CM

## 2011-12-20 DIAGNOSIS — E785 Hyperlipidemia, unspecified: Secondary | ICD-10-CM | POA: Insufficient documentation

## 2011-12-20 DIAGNOSIS — I080 Rheumatic disorders of both mitral and aortic valves: Secondary | ICD-10-CM | POA: Insufficient documentation

## 2011-12-20 DIAGNOSIS — Z8673 Personal history of transient ischemic attack (TIA), and cerebral infarction without residual deficits: Secondary | ICD-10-CM | POA: Insufficient documentation

## 2011-12-20 DIAGNOSIS — I10 Essential (primary) hypertension: Secondary | ICD-10-CM | POA: Insufficient documentation

## 2011-12-20 DIAGNOSIS — I2510 Atherosclerotic heart disease of native coronary artery without angina pectoris: Secondary | ICD-10-CM | POA: Insufficient documentation

## 2011-12-21 ENCOUNTER — Encounter (HOSPITAL_COMMUNITY): Payer: Self-pay

## 2011-12-21 ENCOUNTER — Encounter (HOSPITAL_COMMUNITY)
Admission: RE | Admit: 2011-12-21 | Discharge: 2011-12-21 | Disposition: A | Discharge status: Home / Self Care | Payer: Self-pay | Source: Ambulatory Visit | Attending: Cardiovascular Disease | Admitting: Cardiovascular Disease

## 2011-12-23 ENCOUNTER — Encounter (HOSPITAL_COMMUNITY): Payer: Self-pay

## 2011-12-23 ENCOUNTER — Encounter (HOSPITAL_COMMUNITY)
Admission: RE | Admit: 2011-12-23 | Discharge: 2011-12-23 | Disposition: A | Discharge status: Home / Self Care | Payer: Self-pay | Source: Ambulatory Visit | Attending: Cardiovascular Disease | Admitting: Cardiovascular Disease

## 2011-12-28 ENCOUNTER — Encounter (HOSPITAL_COMMUNITY): Payer: Self-pay

## 2011-12-28 ENCOUNTER — Encounter (HOSPITAL_COMMUNITY)
Admission: RE | Admit: 2011-12-28 | Discharge: 2011-12-28 | Disposition: A | Discharge status: Home / Self Care | Payer: Self-pay | Source: Ambulatory Visit | Attending: Cardiovascular Disease | Admitting: Cardiovascular Disease

## 2011-12-28 DIAGNOSIS — I2510 Atherosclerotic heart disease of native coronary artery without angina pectoris: Secondary | ICD-10-CM | POA: Insufficient documentation

## 2011-12-28 DIAGNOSIS — Z9861 Coronary angioplasty status: Secondary | ICD-10-CM | POA: Insufficient documentation

## 2011-12-28 DIAGNOSIS — Z5189 Encounter for other specified aftercare: Secondary | ICD-10-CM | POA: Insufficient documentation

## 2011-12-28 DIAGNOSIS — Z8249 Family history of ischemic heart disease and other diseases of the circulatory system: Secondary | ICD-10-CM | POA: Insufficient documentation

## 2011-12-28 DIAGNOSIS — E785 Hyperlipidemia, unspecified: Secondary | ICD-10-CM | POA: Insufficient documentation

## 2011-12-30 ENCOUNTER — Encounter (HOSPITAL_COMMUNITY): Payer: Self-pay

## 2011-12-30 ENCOUNTER — Encounter (HOSPITAL_COMMUNITY)
Admission: RE | Admit: 2011-12-30 | Discharge: 2011-12-30 | Disposition: A | Discharge status: Home / Self Care | Payer: Self-pay | Source: Ambulatory Visit | Attending: Cardiovascular Disease | Admitting: Cardiovascular Disease

## 2012-01-04 ENCOUNTER — Encounter (HOSPITAL_COMMUNITY)
Admission: RE | Admit: 2012-01-04 | Discharge: 2012-01-04 | Disposition: A | Discharge status: Home / Self Care | Payer: Self-pay | Source: Ambulatory Visit | Attending: Cardiovascular Disease | Admitting: Cardiovascular Disease

## 2012-01-06 ENCOUNTER — Encounter (HOSPITAL_COMMUNITY)
Admission: RE | Admit: 2012-01-06 | Discharge: 2012-01-06 | Disposition: A | Discharge status: Home / Self Care | Payer: Self-pay | Source: Ambulatory Visit | Attending: Cardiovascular Disease | Admitting: Cardiovascular Disease

## 2012-01-11 ENCOUNTER — Encounter (HOSPITAL_COMMUNITY)
Admission: RE | Admit: 2012-01-11 | Discharge: 2012-01-11 | Disposition: A | Discharge status: Home / Self Care | Payer: Self-pay | Source: Ambulatory Visit | Attending: Cardiovascular Disease | Admitting: Cardiovascular Disease

## 2012-01-13 ENCOUNTER — Encounter (HOSPITAL_COMMUNITY)
Admission: RE | Admit: 2012-01-13 | Discharge: 2012-01-13 | Disposition: A | Discharge status: Home / Self Care | Payer: Self-pay | Source: Ambulatory Visit | Attending: Cardiovascular Disease | Admitting: Cardiovascular Disease

## 2012-01-18 ENCOUNTER — Encounter (HOSPITAL_COMMUNITY)
Admission: RE | Admit: 2012-01-18 | Discharge: 2012-01-18 | Disposition: A | Discharge status: Home / Self Care | Payer: Medicare Other | Source: Ambulatory Visit | Attending: Cardiovascular Disease | Admitting: Cardiovascular Disease

## 2012-01-20 ENCOUNTER — Encounter (HOSPITAL_COMMUNITY): Payer: Self-pay | Admitting: Emergency Medicine

## 2012-01-20 ENCOUNTER — Emergency Department (INDEPENDENT_AMBULATORY_CARE_PROVIDER_SITE_OTHER): Payer: Medicare Other

## 2012-01-20 ENCOUNTER — Emergency Department (INDEPENDENT_AMBULATORY_CARE_PROVIDER_SITE_OTHER)
Admission: EM | Admit: 2012-01-20 | Discharge: 2012-01-20 | Disposition: A | Discharge status: Home / Self Care | Payer: Medicare Other | Source: Home / Self Care | Attending: Family Medicine | Admitting: Family Medicine

## 2012-01-20 ENCOUNTER — Encounter (HOSPITAL_COMMUNITY): Payer: Self-pay

## 2012-01-20 DIAGNOSIS — IMO0002 Reserved for concepts with insufficient information to code with codable children: Secondary | ICD-10-CM

## 2012-01-20 DIAGNOSIS — M1710 Unilateral primary osteoarthritis, unspecified knee: Secondary | ICD-10-CM

## 2012-01-20 DIAGNOSIS — M1712 Unilateral primary osteoarthritis, left knee: Secondary | ICD-10-CM

## 2012-01-20 NOTE — ED Provider Notes (Signed)
History     CSN: [id number]  Arrival date & time 01/20/12  1411   First MD Initiated Contact with Patient 01/20/12 1530      Chief Complaint  Patient presents with  . Knee Pain    (Consider location/radiation/quality/duration/timing/severity/associated sxs/prior treatment) Patient is a 78 y.o. male presenting with knee pain. The history is provided by the patient and the spouse.  Knee Pain This is a chronic (s/p arthoscopic surg 20 yrs ago , dr Simonne Come.) problem. The current episode started more than 1 week ago (ongoing for 2  mos but worse last eve getting off couch, sudden increase pain.). The problem occurs constantly. The problem has been rapidly worsening. The symptoms are aggravated by walking. Treatments tried: crutches and rest have helped.    Past Medical History  Diagnosis Date  . Adenomatous colon polyp   . Internal hemorrhoids   . Diverticulosis   . Hypertension   . COPD (chronic obstructive pulmonary disease)   . Aortic stenosis, mild   . Hyperlipidemia   . CAD (coronary artery disease)     Status post prior myocardial infarction and multiple percutaneous interventions as described above nonobstructive disease at last cath and low-risk Myoview scan in 2007  . Stroke     Paraprocedural stoke with little residual following diagnostic catheterization, good LV function  . Aortic stenosis, mild   . Hypertension   . Hyperlipidemia   . COPD (chronic obstructive pulmonary disease)     Past Surgical History  Procedure Date  . Shoulder surgery     Left  . Inguinal hernia repair     Left  . Knee surgery     Left  . Nasal sinus surgery   . Tonsillectomy     Family History  Problem Relation Age of Onset  . Coronary artery disease Neg Hx     History  Substance Use Topics  . Smoking status: Never Smoker   . Smokeless tobacco: Not on file  . Alcohol Use: Yes     1 glass red wine every other day      Review of Systems  Constitutional: Negative.     Musculoskeletal: Positive for joint swelling and gait problem.    Allergies  Aspirin  Home Medications   Current Outpatient Rx  Name Route Sig Dispense Refill  . ATORVASTATIN CALCIUM 40 MG PO TABS Oral Take 40 mg by mouth daily.      Marland Kitchen CLOPIDOGREL BISULFATE 75 MG PO TABS Oral Take 1 tablet (75 mg total) by mouth daily. 30 tablet 11  . FLUTICASONE-SALMETEROL 250-50 MCG/DOSE IN AEPB Inhalation Inhale 1 puff into the lungs every 12 (twelve) hours.    Marland Kitchen METOPROLOL TARTRATE 50 MG PO TABS  TAKE 1/2 TABLET BY MOUTH TWICE A DAY 30 tablet 12  . MOMETASONE FUROATE 50 MCG/ACT NA SUSP Nasal Place 2 sprays into the nose daily.      Marland Kitchen ONE-DAILY MULTI VITAMINS PO TABS Oral Take 1 tablet by mouth daily.      Marland Kitchen VIACTIV MULTI-VITAMIN PO CHEW Oral Chew 1 tablet by mouth daily.      Marland Kitchen NITROGLYCERIN 0.4 MG SL SUBL Sublingual Place 0.4 mg under the tongue every 5 (five) minutes as needed.      Marland Kitchen FISH OIL 1000 MG PO CAPS Oral Take 1 capsule by mouth every other day.      Marland Kitchen RAMIPRIL 2.5 MG PO CAPS  TAKE ONE TABLET BY MOUTH ONCE DAILY. 30 capsule 4  . ZAFIRLUKAST 20 MG  PO TABS Oral Take 20 mg by mouth daily.        BP 130/58  Pulse 80  Temp(Src) 98.1 F (36.7 C) (Oral)  Resp 18  SpO2 97%  Physical Exam  Nursing note and vitals reviewed. Constitutional: He is oriented to person, place, and time. He appears well-developed and well-nourished.  Musculoskeletal: Normal range of motion. He exhibits tenderness.       Left knee: He exhibits effusion, deformity and bony tenderness. He exhibits no LCL laxity, normal meniscus and no MCL laxity. no tenderness found.       Legs: Neurological: He is alert and oriented to person, place, and time.    ED Course  Procedures (including critical care time)  Labs Reviewed - No data to display Dg Knee Complete 4 Views Left  01/20/2012  *RADIOLOGY REPORT*  Clinical Data: Knee pain, remote trauma and surgery  LEFT KNEE - COMPLETE 4+ VIEW  Comparison: None  Findings: IM  nail left femur. Osseous demineralization. Old healed fracture proximal to mid left fibular diaphysis. Diffuse joint space narrowing with marginal spur formation left knee, greatest at patellofemoral joint. Small knee joint effusion. Mild obliquity on lateral view. No acute fracture, dislocation, or bone destruction.  IMPRESSION: Post-traumatic and postsurgical changes as above. Osteoarthritic changes left knee with small knee joint effusion. No definite acute bony abnormalities.  Original Report Authenticated By: Lollie Marrow, M.D.     1. Degenerative arthritis of left knee       MDM  X-rays reviewed and report per radiologist.          Barkley Bruns, MD 01/20/12 1754

## 2012-01-20 NOTE — ED Notes (Signed)
PT HERE WITH C/O LEFT KNEE PAIN THAT HAS BEEN ONGOING INTERMITT X 2 MNTHS BUT WORSENED LAST NIGHT WHEN HE GOT OFF OF COUCH.SUDDEN CONSTANT ACHY PAIN WITH AMBULATION AFFECTING SLEEP.PT S/P ROD KNEE TO HIP OVER 20 YRS AGO.CALLED [facility] BUT UNABLE TO GET APPT UNTIL NEXT Wednesday.NO SWELLING OR INFLAMMATION SEEN

## 2012-01-20 NOTE — Discharge Instructions (Signed)
See dr Simonne Come as planned next week, wear brace when walking, no need for crutches. Use tylenol if needed.

## 2012-01-24 ENCOUNTER — Emergency Department (HOSPITAL_COMMUNITY)
Admission: EM | Admit: 2012-01-24 | Discharge: 2012-01-24 | Disposition: A | Discharge status: Home / Self Care | Payer: Medicare Other | Attending: Emergency Medicine | Admitting: Emergency Medicine

## 2012-01-24 ENCOUNTER — Encounter (HOSPITAL_COMMUNITY): Payer: Self-pay | Admitting: *Deleted

## 2012-01-24 ENCOUNTER — Other Ambulatory Visit: Payer: Self-pay | Admitting: Cardiovascular Disease

## 2012-01-24 DIAGNOSIS — N390 Urinary tract infection, site not specified: Secondary | ICD-10-CM

## 2012-01-24 DIAGNOSIS — I10 Essential (primary) hypertension: Secondary | ICD-10-CM | POA: Insufficient documentation

## 2012-01-24 DIAGNOSIS — J449 Chronic obstructive pulmonary disease, unspecified: Secondary | ICD-10-CM | POA: Insufficient documentation

## 2012-01-24 DIAGNOSIS — Z8679 Personal history of other diseases of the circulatory system: Secondary | ICD-10-CM | POA: Insufficient documentation

## 2012-01-24 DIAGNOSIS — E785 Hyperlipidemia, unspecified: Secondary | ICD-10-CM | POA: Insufficient documentation

## 2012-01-24 DIAGNOSIS — J4489 Other specified chronic obstructive pulmonary disease: Secondary | ICD-10-CM | POA: Insufficient documentation

## 2012-01-24 DIAGNOSIS — I2510 Atherosclerotic heart disease of native coronary artery without angina pectoris: Secondary | ICD-10-CM | POA: Insufficient documentation

## 2012-01-24 DIAGNOSIS — R339 Retention of urine, unspecified: Secondary | ICD-10-CM | POA: Insufficient documentation

## 2012-01-24 DIAGNOSIS — Z79899 Other long term (current) drug therapy: Secondary | ICD-10-CM | POA: Insufficient documentation

## 2012-01-24 DIAGNOSIS — R300 Dysuria: Secondary | ICD-10-CM | POA: Insufficient documentation

## 2012-01-24 LAB — URINE MICROSCOPIC-ADD ON

## 2012-01-24 LAB — URINALYSIS, ROUTINE W REFLEX MICROSCOPIC
Bilirubin Urine: NEGATIVE
Glucose, UA: NEGATIVE mg/dL
Protein, ur: 30 mg/dL — AB
Specific Gravity, Urine: 1.022 (ref 1.005–1.030)
Urobilinogen, UA: 1 mg/dL (ref 0.0–1.0)

## 2012-01-24 MED ORDER — CEPHALEXIN 500 MG PO CAPS: 500.0000 mg | ORAL_CAPSULE | Freq: Three times a day (TID) | ORAL | Status: AC

## 2012-01-24 MED ORDER — CEFTRIAXONE SODIUM 1 G IJ SOLR
1.0000 g | Freq: Once | INTRAMUSCULAR | Status: AC
  Administered 2012-01-24: 1 g via INTRAMUSCULAR
  Filled 2012-01-24: qty 10

## 2012-01-24 MED ORDER — LIDOCAINE HCL (PF) 1 % IJ SOLN
INTRAMUSCULAR | Status: AC
  Administered 2012-01-24: 2.1 mL
  Filled 2012-01-24: qty 5

## 2012-01-24 MED ORDER — LIDOCAINE HCL (PF) 1 % IJ SOLN
2.1000 mL | Freq: Once | INTRAMUSCULAR | Status: AC
  Administered 2012-01-24: 2.1 mL

## 2012-01-24 NOTE — Discharge Instructions (Signed)
Drink plenty of fluids. Take keflex (antibiotic) as prescribed. Take tylenol/advil as need. Follow up with primary care doctor in 2-3 days if symptoms fail to improve/resolve. Also, a urine culture was sent the results of which will be back in 2 days time, have your doctors office follow up on that culture result (to make sure you are on the most appropriate antibiotic). Return to ER if worse, abdominal pain, vomiting, high fevers, other concern.     Urinary Tract Infection Infections of the urinary tract can start in several places. A bladder infection (cystitis), a kidney infection (pyelonephritis), and a prostate infection (prostatitis) are different types of urinary tract infections (UTIs). They usually get better if treated with medicines (antibiotics) that kill germs. Take all the medicine until it is gone. You or your child may feel better in a few days, but TAKE ALL MEDICINE or the infection may not respond and may become more difficult to treat. HOME CARE INSTRUCTIONS   Drink enough water and fluids to keep the urine clear or pale yellow. Cranberry juice is especially recommended, in addition to large amounts of water.   Avoid caffeine, tea, and carbonated beverages. They tend to irritate the bladder.   Alcohol may irritate the prostate.   Only take over-the-counter or prescription medicines for pain, discomfort, or fever as directed by your caregiver.  To prevent further infections:  Empty the bladder often. Avoid holding urine for long periods of time.   After a bowel movement, women should cleanse from front to back. Use each tissue only once.   Empty the bladder before and after sexual intercourse.  FINDING OUT THE RESULTS OF YOUR TEST Not all test results are available during your visit. If your or your child's test results are not back during the visit, make an appointment with your caregiver to find out the results. Do not assume everything is normal if you have not heard from  your caregiver or the medical facility. It is important for you to follow up on all test results. SEEK MEDICAL CARE IF:   There is back pain.   Your baby is older than 3 months with a rectal temperature of 100.5 F (38.1 C) or higher for more than 1 day.   Your or your child's problems (symptoms) are no better in 3 days. Return sooner if you or your child is getting worse.  SEEK IMMEDIATE MEDICAL CARE IF:   There is severe back pain or lower abdominal pain.   You or your child develops chills.   You have a fever.   Your baby is older than 3 months with a rectal temperature of 102 F (38.9 C) or higher.   Your baby is 5 months old or younger with a rectal temperature of 100.4 F (38 C) or higher.   There is nausea or vomiting.   There is continued burning or discomfort with urination.  MAKE SURE YOU:   Understand these instructions.   Will watch your condition.   Will get help right away if you are not doing well or get worse.  Document Released: 08/18/2005 Document Revised: 10/28/2011 Document Reviewed: 03/23/2007 [facility] Patient Information 2012 [place], [place].

## 2012-01-24 NOTE — ED Notes (Signed)
Patient states onset Friday urinary frequency and dysuria. Worsening overtime.  Pain only upon urination. Denies pain at rest or with palpation.   Abdomen soft nondistended. Patient states one day ago took a shower heard water running and urinated more urine because possible hearing water running.  Ax4 airway intact bilateral equal chest rise and fall. Family member at bedside.

## 2012-01-24 NOTE — ED Provider Notes (Signed)
History     CSN: [id number]  Arrival date & time 01/24/12  1011   First MD Initiated Contact with Patient 01/24/12 1112      Chief Complaint  Patient presents with  . Urinary Retention  . Dysuria    (Consider location/radiation/quality/duration/timing/severity/associated sxs/prior treatment) Patient is a 78 y.o. male presenting with dysuria. The history is provided by the patient and the spouse.  Dysuria  Pertinent negatives include no chills, no nausea, no vomiting, no hematuria and no flank pain.  pt with dysuria in past 1-2 days. States feels as if is able to empty bladder. No abd or flank pain. Hx bph. No hx uti. No penile discharge. No fever or chills. No nvd.   Past Medical History  Diagnosis Date  . Adenomatous colon polyp   . Internal hemorrhoids   . Diverticulosis   . Hypertension   . COPD (chronic obstructive pulmonary disease)   . Aortic stenosis, mild   . Hyperlipidemia   . CAD (coronary artery disease)     Status post prior myocardial infarction and multiple percutaneous interventions as described above nonobstructive disease at last cath and low-risk Myoview scan in 2007  . Stroke     Paraprocedural stoke with little residual following diagnostic catheterization, good LV function  . Aortic stenosis, mild   . Hypertension   . Hyperlipidemia   . COPD (chronic obstructive pulmonary disease)     Past Surgical History  Procedure Date  . Shoulder surgery     Left  . Inguinal hernia repair     Left  . Knee surgery     Left  . Nasal sinus surgery   . Tonsillectomy   . Coronary angioplasty with stent placement     Family History  Problem Relation Age of Onset  . Coronary artery disease Neg Hx     History  Substance Use Topics  . Smoking status: Never Smoker   . Smokeless tobacco: Not on file  . Alcohol Use: Yes     1 glass red wine every other day      Review of Systems  Constitutional: Negative for fever and chills.  Cardiovascular: Negative  for leg swelling.  Gastrointestinal: Negative for nausea, vomiting, abdominal pain and abdominal distention.  Genitourinary: Positive for dysuria. Negative for hematuria, flank pain, discharge, scrotal swelling and testicular pain.  Musculoskeletal: Negative for back pain.    Allergies  Aspirin  Home Medications   Current Outpatient Rx  Name Route Sig Dispense Refill  . ATORVASTATIN CALCIUM 40 MG PO TABS Oral Take 40 mg by mouth daily.      Marland Kitchen CLOPIDOGREL BISULFATE 75 MG PO TABS Oral Take 1 tablet (75 mg total) by mouth daily. 30 tablet 11  . FLUTICASONE-SALMETEROL 250-50 MCG/DOSE IN AEPB Inhalation Inhale 1 puff into the lungs every 12 (twelve) hours.    Marland Kitchen METOPROLOL TARTRATE 50 MG PO TABS  TAKE 1/2 TABLET BY MOUTH TWICE A DAY 30 tablet 12  . MOMETASONE FUROATE 50 MCG/ACT NA SUSP Nasal Place 2 sprays into the nose 2 (two) times daily.     Marland Kitchen ONE-DAILY MULTI VITAMINS PO TABS Oral Take 1 tablet by mouth daily.      Marland Kitchen VIACTIV MULTI-VITAMIN PO CHEW Oral Chew 1 tablet by mouth daily.      Marland Kitchen FISH OIL 1000 MG PO CAPS Oral Take 1 capsule by mouth every other day.      Marland Kitchen RAMIPRIL 2.5 MG PO CAPS  TAKE ONE TABLET BY MOUTH ONCE  DAILY. 30 capsule 4  . ZAFIRLUKAST 20 MG PO TABS Oral Take 20 mg by mouth daily.      Marland Kitchen NITROGLYCERIN 0.4 MG SL SUBL Sublingual Place 0.4 mg under the tongue every 5 (five) minutes as needed.        BP 122/52  Pulse 64  Temp(Src) 97.4 F (36.3 C) (Oral)  Resp 16  Ht 5\' 5"  (1.651 m)  SpO2 100%  Physical Exam  Nursing note and vitals reviewed. Constitutional: He is oriented to person, place, and time. He appears well-developed and well-nourished. No distress.  HENT:  Head: Atraumatic.  Eyes: Pupils are equal, round, and reactive to light.  Neck: Neck supple. No tracheal deviation present.  Cardiovascular: Normal rate, regular rhythm, normal heart sounds and intact distal pulses.   Pulmonary/Chest: Effort normal and breath sounds normal. No accessory muscle usage. No  respiratory distress.  Abdominal: Soft. He exhibits no distension and no mass. There is no tenderness. There is no rebound and no guarding.  Genitourinary:       No cva tenderness  Musculoskeletal: Normal range of motion.  Neurological: He is alert and oriented to person, place, and time.  Skin: Skin is warm and dry.  Psychiatric: He has a normal mood and affect.    ED Course  Procedures (including critical care time)   Labs Reviewed  URINALYSIS, ROUTINE W REFLEX MICROSCOPIC   Results for orders placed during the hospital encounter of 01/24/12  URINALYSIS, ROUTINE W REFLEX MICROSCOPIC      Component Value Range   Color, Urine YELLOW  YELLOW    APPearance CLOUDY (*) CLEAR    Specific Gravity, Urine 1.022  1.005 - 1.030    pH 7.0  5.0 - 8.0    Glucose, UA NEGATIVE  NEGATIVE (mg/dL)   Hgb urine dipstick SMALL (*) NEGATIVE    Bilirubin Urine NEGATIVE  NEGATIVE    Ketones, ur 15 (*) NEGATIVE (mg/dL)   Protein, ur 30 (*) NEGATIVE (mg/dL)   Urobilinogen, UA 1.0  0.0 - 1.0 (mg/dL)   Nitrite POSITIVE (*) NEGATIVE    Leukocytes, UA MODERATE (*) NEGATIVE   URINE MICROSCOPIC-ADD ON      Component Value Range   WBC, UA TOO NUMEROUS TO COUNT  <3 (WBC/hpf)   RBC / HPF 3-6  <3 (RBC/hpf)   Bacteria, UA MANY (*) RARE    Urine-Other SPERM PRESENT      MDM  Ua sent.   ua positive, u culture added. Rocephin im. rx keflex for home.       Suzi Roots, MD 01/24/12 [phone]

## 2012-01-24 NOTE — ED Notes (Signed)
Patient with urinary retention and burning when voiding since Friday.  He denies hx of same.  Patient denies fever,  Denies nausea.  Denies back pain

## 2012-01-25 ENCOUNTER — Encounter (HOSPITAL_COMMUNITY): Payer: Self-pay

## 2012-01-25 ENCOUNTER — Other Ambulatory Visit: Payer: Self-pay

## 2012-01-25 DIAGNOSIS — E785 Hyperlipidemia, unspecified: Secondary | ICD-10-CM | POA: Insufficient documentation

## 2012-01-25 DIAGNOSIS — Z5189 Encounter for other specified aftercare: Secondary | ICD-10-CM | POA: Insufficient documentation

## 2012-01-25 DIAGNOSIS — Z9861 Coronary angioplasty status: Secondary | ICD-10-CM | POA: Insufficient documentation

## 2012-01-25 DIAGNOSIS — I2510 Atherosclerotic heart disease of native coronary artery without angina pectoris: Secondary | ICD-10-CM | POA: Insufficient documentation

## 2012-01-25 DIAGNOSIS — Z8249 Family history of ischemic heart disease and other diseases of the circulatory system: Secondary | ICD-10-CM | POA: Insufficient documentation

## 2012-01-25 MED ORDER — NITROGLYCERIN 0.4 MG SL SUBL: 0.4000 mg | SUBLINGUAL_TABLET | SUBLINGUAL | Status: DC | PRN

## 2012-01-26 LAB — URINE CULTURE

## 2012-01-27 ENCOUNTER — Encounter (HOSPITAL_COMMUNITY): Payer: Self-pay

## 2012-01-27 NOTE — ED Notes (Signed)
+   urine Patient treated with Keflex-sensitive to same-chart appended per protocol MD. 

## 2012-02-01 ENCOUNTER — Encounter (HOSPITAL_COMMUNITY)
Admission: RE | Admit: 2012-02-01 | Discharge: 2012-02-01 | Disposition: A | Discharge status: Home / Self Care | Payer: Self-pay | Source: Ambulatory Visit | Attending: Cardiovascular Disease | Admitting: Cardiovascular Disease

## 2012-02-03 ENCOUNTER — Encounter (HOSPITAL_COMMUNITY)
Admission: RE | Admit: 2012-02-03 | Discharge: 2012-02-03 | Disposition: A | Discharge status: Home / Self Care | Payer: Self-pay | Source: Ambulatory Visit | Attending: Cardiovascular Disease | Admitting: Cardiovascular Disease

## 2012-02-08 ENCOUNTER — Encounter (HOSPITAL_COMMUNITY)
Admission: RE | Admit: 2012-02-08 | Discharge: 2012-02-08 | Disposition: A | Discharge status: Home / Self Care | Payer: Self-pay | Source: Ambulatory Visit | Attending: Cardiovascular Disease | Admitting: Cardiovascular Disease

## 2012-02-10 ENCOUNTER — Encounter (HOSPITAL_COMMUNITY)
Admission: RE | Admit: 2012-02-10 | Discharge: 2012-02-10 | Disposition: A | Discharge status: Home / Self Care | Payer: Self-pay | Source: Ambulatory Visit | Attending: Cardiovascular Disease | Admitting: Cardiovascular Disease

## 2012-02-15 ENCOUNTER — Encounter (HOSPITAL_COMMUNITY): Payer: Self-pay

## 2012-02-17 ENCOUNTER — Encounter (HOSPITAL_COMMUNITY)
Admission: RE | Admit: 2012-02-17 | Discharge: 2012-02-17 | Disposition: A | Discharge status: Home / Self Care | Payer: Self-pay | Source: Ambulatory Visit | Attending: Cardiovascular Disease | Admitting: Cardiovascular Disease

## 2012-02-22 ENCOUNTER — Encounter (HOSPITAL_COMMUNITY)
Admission: RE | Admit: 2012-02-22 | Discharge: 2012-02-22 | Disposition: A | Discharge status: Home / Self Care | Payer: Self-pay | Source: Ambulatory Visit | Attending: Cardiovascular Disease | Admitting: Cardiovascular Disease

## 2012-02-22 ENCOUNTER — Other Ambulatory Visit: Payer: Self-pay | Admitting: Cardiovascular Disease

## 2012-02-22 DIAGNOSIS — Z8249 Family history of ischemic heart disease and other diseases of the circulatory system: Secondary | ICD-10-CM | POA: Insufficient documentation

## 2012-02-22 DIAGNOSIS — Z9861 Coronary angioplasty status: Secondary | ICD-10-CM | POA: Insufficient documentation

## 2012-02-22 DIAGNOSIS — E785 Hyperlipidemia, unspecified: Secondary | ICD-10-CM | POA: Insufficient documentation

## 2012-02-22 DIAGNOSIS — Z5189 Encounter for other specified aftercare: Secondary | ICD-10-CM | POA: Insufficient documentation

## 2012-02-22 DIAGNOSIS — I2510 Atherosclerotic heart disease of native coronary artery without angina pectoris: Secondary | ICD-10-CM | POA: Insufficient documentation

## 2012-02-24 ENCOUNTER — Encounter (HOSPITAL_COMMUNITY): Payer: Self-pay

## 2012-02-29 ENCOUNTER — Encounter (HOSPITAL_COMMUNITY)
Admission: RE | Admit: 2012-02-29 | Discharge: 2012-02-29 | Disposition: A | Discharge status: Home / Self Care | Payer: Self-pay | Source: Ambulatory Visit | Attending: Cardiovascular Disease | Admitting: Cardiovascular Disease

## 2012-03-02 ENCOUNTER — Encounter (HOSPITAL_COMMUNITY)
Admission: RE | Admit: 2012-03-02 | Discharge: 2012-03-02 | Disposition: A | Discharge status: Home / Self Care | Payer: Self-pay | Source: Ambulatory Visit | Attending: Cardiovascular Disease | Admitting: Cardiovascular Disease

## 2012-03-07 ENCOUNTER — Encounter (HOSPITAL_COMMUNITY)
Admission: RE | Admit: 2012-03-07 | Discharge: 2012-03-07 | Disposition: A | Discharge status: Home / Self Care | Payer: Self-pay | Source: Ambulatory Visit | Attending: Cardiovascular Disease | Admitting: Cardiovascular Disease

## 2012-03-09 ENCOUNTER — Encounter (HOSPITAL_COMMUNITY)
Admission: RE | Admit: 2012-03-09 | Discharge: 2012-03-09 | Disposition: A | Discharge status: Home / Self Care | Payer: Self-pay | Source: Ambulatory Visit | Attending: Cardiovascular Disease | Admitting: Cardiovascular Disease

## 2012-03-14 ENCOUNTER — Encounter (HOSPITAL_COMMUNITY)
Admission: RE | Admit: 2012-03-14 | Discharge: 2012-03-14 | Disposition: A | Discharge status: Home / Self Care | Payer: Self-pay | Source: Ambulatory Visit | Attending: Cardiovascular Disease | Admitting: Cardiovascular Disease

## 2012-03-16 ENCOUNTER — Encounter (HOSPITAL_COMMUNITY)
Admission: RE | Admit: 2012-03-16 | Discharge: 2012-03-16 | Disposition: A | Discharge status: Home / Self Care | Payer: Medicare Other | Source: Ambulatory Visit | Attending: Cardiovascular Disease | Admitting: Cardiovascular Disease

## 2012-03-21 ENCOUNTER — Encounter (HOSPITAL_COMMUNITY)
Admission: RE | Admit: 2012-03-21 | Discharge: 2012-03-21 | Disposition: A | Discharge status: Home / Self Care | Payer: Medicare Other | Source: Ambulatory Visit | Attending: Cardiovascular Disease | Admitting: Cardiovascular Disease

## 2012-03-23 ENCOUNTER — Encounter (HOSPITAL_COMMUNITY)
Admission: RE | Admit: 2012-03-23 | Discharge: 2012-03-23 | Disposition: A | Discharge status: Home / Self Care | Payer: Self-pay | Source: Ambulatory Visit | Attending: Cardiovascular Disease | Admitting: Cardiovascular Disease

## 2012-03-23 DIAGNOSIS — Z9861 Coronary angioplasty status: Secondary | ICD-10-CM | POA: Insufficient documentation

## 2012-03-23 DIAGNOSIS — I2510 Atherosclerotic heart disease of native coronary artery without angina pectoris: Secondary | ICD-10-CM | POA: Insufficient documentation

## 2012-03-23 DIAGNOSIS — E785 Hyperlipidemia, unspecified: Secondary | ICD-10-CM | POA: Insufficient documentation

## 2012-03-23 DIAGNOSIS — Z8249 Family history of ischemic heart disease and other diseases of the circulatory system: Secondary | ICD-10-CM | POA: Insufficient documentation

## 2012-03-23 DIAGNOSIS — Z5189 Encounter for other specified aftercare: Secondary | ICD-10-CM | POA: Insufficient documentation

## 2012-03-28 ENCOUNTER — Encounter (HOSPITAL_COMMUNITY)
Admission: RE | Admit: 2012-03-28 | Discharge: 2012-03-28 | Disposition: A | Discharge status: Home / Self Care | Payer: Self-pay | Source: Ambulatory Visit | Attending: Cardiovascular Disease | Admitting: Cardiovascular Disease

## 2012-03-30 ENCOUNTER — Encounter (HOSPITAL_COMMUNITY)
Admission: RE | Admit: 2012-03-30 | Discharge: 2012-03-30 | Disposition: A | Discharge status: Home / Self Care | Payer: Self-pay | Source: Ambulatory Visit | Attending: Cardiovascular Disease | Admitting: Cardiovascular Disease

## 2012-04-04 ENCOUNTER — Encounter (HOSPITAL_COMMUNITY)
Admission: RE | Admit: 2012-04-04 | Discharge: 2012-04-04 | Disposition: A | Discharge status: Home / Self Care | Payer: Self-pay | Source: Ambulatory Visit | Attending: Cardiovascular Disease | Admitting: Cardiovascular Disease

## 2012-04-06 ENCOUNTER — Encounter (HOSPITAL_COMMUNITY)
Admission: RE | Admit: 2012-04-06 | Discharge: 2012-04-06 | Disposition: A | Discharge status: Home / Self Care | Payer: Self-pay | Source: Ambulatory Visit | Attending: Cardiovascular Disease | Admitting: Cardiovascular Disease

## 2012-04-11 ENCOUNTER — Encounter (HOSPITAL_COMMUNITY)
Admission: RE | Admit: 2012-04-11 | Discharge: 2012-04-11 | Disposition: A | Discharge status: Home / Self Care | Payer: Self-pay | Source: Ambulatory Visit | Attending: Cardiovascular Disease | Admitting: Cardiovascular Disease

## 2012-04-13 ENCOUNTER — Encounter (HOSPITAL_COMMUNITY): Payer: Self-pay

## 2012-04-18 ENCOUNTER — Encounter (HOSPITAL_COMMUNITY)
Admission: RE | Admit: 2012-04-18 | Discharge: 2012-04-18 | Disposition: A | Discharge status: Home / Self Care | Payer: Self-pay | Source: Ambulatory Visit | Attending: Cardiovascular Disease | Admitting: Cardiovascular Disease

## 2012-04-20 ENCOUNTER — Encounter (HOSPITAL_COMMUNITY)
Admission: RE | Admit: 2012-04-20 | Discharge: 2012-04-20 | Disposition: A | Discharge status: Home / Self Care | Payer: Self-pay | Source: Ambulatory Visit | Attending: Cardiovascular Disease | Admitting: Cardiovascular Disease

## 2012-04-25 ENCOUNTER — Encounter (HOSPITAL_COMMUNITY)
Admission: RE | Admit: 2012-04-25 | Discharge: 2012-04-25 | Disposition: A | Discharge status: Home / Self Care | Payer: Self-pay | Source: Ambulatory Visit | Attending: Cardiovascular Disease | Admitting: Cardiovascular Disease

## 2012-04-25 DIAGNOSIS — I2510 Atherosclerotic heart disease of native coronary artery without angina pectoris: Secondary | ICD-10-CM | POA: Insufficient documentation

## 2012-04-25 DIAGNOSIS — Z8249 Family history of ischemic heart disease and other diseases of the circulatory system: Secondary | ICD-10-CM | POA: Insufficient documentation

## 2012-04-25 DIAGNOSIS — Z9861 Coronary angioplasty status: Secondary | ICD-10-CM | POA: Insufficient documentation

## 2012-04-25 DIAGNOSIS — Z5189 Encounter for other specified aftercare: Secondary | ICD-10-CM | POA: Insufficient documentation

## 2012-04-25 DIAGNOSIS — E785 Hyperlipidemia, unspecified: Secondary | ICD-10-CM | POA: Insufficient documentation

## 2012-04-27 ENCOUNTER — Encounter (HOSPITAL_COMMUNITY)
Admission: RE | Admit: 2012-04-27 | Discharge: 2012-04-27 | Disposition: A | Discharge status: Home / Self Care | Payer: Self-pay | Source: Ambulatory Visit | Attending: Cardiovascular Disease | Admitting: Cardiovascular Disease

## 2012-05-02 ENCOUNTER — Encounter (HOSPITAL_COMMUNITY)
Admission: RE | Admit: 2012-05-02 | Discharge: 2012-05-02 | Disposition: A | Discharge status: Home / Self Care | Payer: Self-pay | Source: Ambulatory Visit | Attending: Cardiovascular Disease | Admitting: Cardiovascular Disease

## 2012-05-04 ENCOUNTER — Encounter (HOSPITAL_COMMUNITY)
Admission: RE | Admit: 2012-05-04 | Discharge: 2012-05-04 | Disposition: A | Discharge status: Home / Self Care | Payer: Self-pay | Source: Ambulatory Visit | Attending: Cardiovascular Disease | Admitting: Cardiovascular Disease

## 2012-05-09 ENCOUNTER — Encounter (HOSPITAL_COMMUNITY)
Admission: RE | Admit: 2012-05-09 | Discharge: 2012-05-09 | Disposition: A | Discharge status: Home / Self Care | Payer: Self-pay | Source: Ambulatory Visit | Attending: Cardiovascular Disease | Admitting: Cardiovascular Disease

## 2012-05-11 ENCOUNTER — Encounter (HOSPITAL_COMMUNITY)
Admission: RE | Admit: 2012-05-11 | Discharge: 2012-05-11 | Disposition: A | Discharge status: Home / Self Care | Payer: Self-pay | Source: Ambulatory Visit | Attending: Cardiovascular Disease | Admitting: Cardiovascular Disease

## 2012-05-16 ENCOUNTER — Encounter (HOSPITAL_COMMUNITY)
Admission: RE | Admit: 2012-05-16 | Discharge: 2012-05-16 | Disposition: A | Discharge status: Home / Self Care | Payer: Self-pay | Source: Ambulatory Visit | Attending: Cardiovascular Disease | Admitting: Cardiovascular Disease

## 2012-05-18 ENCOUNTER — Encounter (HOSPITAL_COMMUNITY): Payer: Medicare Other

## 2012-05-22 ENCOUNTER — Encounter: Payer: Self-pay | Admitting: Internal Medicine

## 2012-05-23 ENCOUNTER — Encounter (HOSPITAL_COMMUNITY)
Admission: RE | Admit: 2012-05-23 | Discharge: 2012-05-23 | Disposition: A | Discharge status: Home / Self Care | Payer: Medicare Other | Source: Ambulatory Visit | Attending: Cardiovascular Disease | Admitting: Cardiovascular Disease

## 2012-05-23 DIAGNOSIS — Z5189 Encounter for other specified aftercare: Secondary | ICD-10-CM | POA: Insufficient documentation

## 2012-05-23 DIAGNOSIS — E785 Hyperlipidemia, unspecified: Secondary | ICD-10-CM | POA: Insufficient documentation

## 2012-05-23 DIAGNOSIS — Z9861 Coronary angioplasty status: Secondary | ICD-10-CM | POA: Insufficient documentation

## 2012-05-23 DIAGNOSIS — Z8249 Family history of ischemic heart disease and other diseases of the circulatory system: Secondary | ICD-10-CM | POA: Insufficient documentation

## 2012-05-23 DIAGNOSIS — I2510 Atherosclerotic heart disease of native coronary artery without angina pectoris: Secondary | ICD-10-CM | POA: Insufficient documentation

## 2012-05-25 ENCOUNTER — Encounter (HOSPITAL_COMMUNITY): Payer: Medicare Other

## 2012-05-30 ENCOUNTER — Encounter (HOSPITAL_COMMUNITY)
Admission: RE | Admit: 2012-05-30 | Discharge: 2012-05-30 | Disposition: A | Discharge status: Home / Self Care | Payer: Medicare Other | Source: Ambulatory Visit | Attending: Cardiovascular Disease | Admitting: Cardiovascular Disease

## 2012-06-01 ENCOUNTER — Encounter (HOSPITAL_COMMUNITY)
Admission: RE | Admit: 2012-06-01 | Discharge: 2012-06-01 | Disposition: A | Discharge status: Home / Self Care | Payer: Medicare Other | Source: Ambulatory Visit | Attending: Cardiovascular Disease | Admitting: Cardiovascular Disease

## 2012-06-06 ENCOUNTER — Encounter (HOSPITAL_COMMUNITY)
Admission: RE | Admit: 2012-06-06 | Discharge: 2012-06-06 | Disposition: A | Discharge status: Home / Self Care | Payer: Medicare Other | Source: Ambulatory Visit | Attending: Cardiovascular Disease | Admitting: Cardiovascular Disease

## 2012-06-08 ENCOUNTER — Encounter (HOSPITAL_COMMUNITY)
Admission: RE | Admit: 2012-06-08 | Discharge: 2012-06-08 | Disposition: A | Discharge status: Home / Self Care | Payer: Medicare Other | Source: Ambulatory Visit | Attending: Cardiovascular Disease | Admitting: Cardiovascular Disease

## 2012-06-13 ENCOUNTER — Encounter (HOSPITAL_COMMUNITY)
Admission: RE | Admit: 2012-06-13 | Discharge: 2012-06-13 | Disposition: A | Discharge status: Home / Self Care | Payer: Medicare Other | Source: Ambulatory Visit | Attending: Cardiovascular Disease | Admitting: Cardiovascular Disease

## 2012-06-15 ENCOUNTER — Encounter (HOSPITAL_COMMUNITY)
Admission: RE | Admit: 2012-06-15 | Discharge: 2012-06-15 | Disposition: A | Discharge status: Home / Self Care | Payer: Medicare Other | Source: Ambulatory Visit | Attending: Cardiovascular Disease | Admitting: Cardiovascular Disease

## 2012-06-20 ENCOUNTER — Ambulatory Visit (INDEPENDENT_AMBULATORY_CARE_PROVIDER_SITE_OTHER): Payer: Medicare Other | Admitting: Internal Medicine

## 2012-06-20 ENCOUNTER — Encounter (HOSPITAL_COMMUNITY)
Admission: RE | Admit: 2012-06-20 | Discharge: 2012-06-20 | Disposition: A | Discharge status: Home / Self Care | Payer: Medicare Other | Source: Ambulatory Visit | Attending: Cardiovascular Disease | Admitting: Cardiovascular Disease

## 2012-06-20 ENCOUNTER — Encounter: Payer: Self-pay | Admitting: Internal Medicine

## 2012-06-20 VITALS — BP 120/58 | HR 52 | Ht 65.0 in | Wt 105.0 lb

## 2012-06-20 DIAGNOSIS — Z86010 Personal history of colonic polyps: Secondary | ICD-10-CM

## 2012-06-20 DIAGNOSIS — K5900 Constipation, unspecified: Secondary | ICD-10-CM

## 2012-06-20 DIAGNOSIS — I2510 Atherosclerotic heart disease of native coronary artery without angina pectoris: Secondary | ICD-10-CM

## 2012-06-20 DIAGNOSIS — R634 Abnormal weight loss: Secondary | ICD-10-CM

## 2012-06-20 MED ORDER — MOVIPREP 100 G PO SOLR: 1.0000 | Freq: Once | ORAL | Status: DC

## 2012-06-20 NOTE — Progress Notes (Signed)
HISTORY OF PRESENT ILLNESS:  Edward [name] is a 78 y.o. male with multiple medical problems including hypertension, COPD, coronary artery disease with prior myocardial infarction, good LV function, mild aortic stenosis, and periprocedural stroke. He is on chronic Plavix therapy and has been so for years. He is sent today by the urology office regarding change in bowel habits, weight loss, and the need for followup colonoscopy. The patient is accompanied by his wife. He last underwent colonoscopy in October of 2008. He was found to have an 8 mm adenomatous colon polyp in the cecum which was removed. As well internal hemorrhoids and diverticulosis. Followup in 5 years recommended. Patient recently had an Escherichia coli urinary tract infection for which he was treated with Keflex. Thereafter developed problems with constipation. He's had constipation intermittently with narrowing of stool caliber. No bleeding. No abdominal pain. In terms of weight loss, the patient and his wife were unaware. They state that he is a small eater and tends to get full easily. This is not new. Reviewing of the records shows that his weight in January was 110 pounds, in March 110 pounds, and today 105 pounds. He denies dysphagia or melena. He tends cardiac rehabilitation routinely and is doing well with good exercise tolerance. Outside records from the urology office at laboratories reviewed.  REVIEW OF SYSTEMS:  All non-GI ROS entirely negative upon complete review  Past Medical History  Diagnosis Date  . Adenomatous colon polyp   . Internal hemorrhoids   . Diverticulosis   . Hypertension   . COPD (chronic obstructive pulmonary disease)   . Aortic stenosis, mild   . Hyperlipidemia   . CAD (coronary artery disease)     Status post prior myocardial infarction and multiple percutaneous interventions as described above nonobstructive disease at last cath and low-risk Myoview scan in 2007  . Stroke     Paraprocedural stoke  with little residual following diagnostic catheterization, good LV function  . Aortic stenosis, mild   . Hypertension   . Hyperlipidemia   . COPD (chronic obstructive pulmonary disease)     Past Surgical History  Procedure Date  . Shoulder surgery     Left  . Inguinal hernia repair     Left  . Knee surgery     Left  . Nasal sinus surgery   . Tonsillectomy   . Coronary angioplasty with stent placement     Social History Edward [name]  reports that he has never smoked. He has never used smokeless tobacco. He reports that he drinks alcohol. He reports that he does not use illicit drugs.  family history includes Tuberculosis in his mother.  There is no history of Coronary artery disease.  Allergies  Allergen Reactions  . Aspirin Rash       PHYSICAL EXAMINATION: Vital signs: BP 120/58  Pulse 52  Ht 5\' 5"  (1.651 m)  Wt 105 lb (47.628 kg)  BMI 17.47 kg/m2  Constitutional: generally well-appearing, no acute distress Psychiatric: alert and oriented x3, cooperative Eyes: extraocular movements intact, anicteric, conjunctiva pink Mouth: oral pharynx moist, no lesions Neck: supple no lymphadenopathy Cardiovascular: heart regular rate and rhythm, no murmur Lungs: clear to auscultation bilaterally Abdomen: soft, nontender, nondistended, no obvious ascites, no peritoneal signs, normal bowel sounds, no organomegaly. No succussion splash Rectal:deferred until colonoscopy Extremities: no lower extremity edema bilaterally Skin: no lesions on visible extremities Neuro: No focal deficits.    ASSESSMENT:  #1. Recent constipation and change in stool caliber. Likely functional. #2.  Minimal weight loss. 5 pounds documented in the past 4-5 months #3. History of adenomatous colon polyps. Due for followup this year. The patient is interested #4. Multiple medical problems. On Plavix.   PLAN:  #1. Colonoscopy to evaluate change in bowel habits, minimal weight loss, and provide  surveillance at the appropriate time. The patient is high-risk given his age and comorbidities.The nature of the procedure, as well as the risks, benefits, and alternatives were carefully and thoroughly reviewed with the patient, as well as his wife. Ample time for discussion and questions allowed. The patient understood, was satisfied, and agreed to proceed.  #2. Movi prep prescribed. The patient instructed on its use #3. Confer with the patient's cardiologist regarding the feasibility of holding Plavix 5 days prior to his procedure. We will send a letter in this regard.

## 2012-06-20 NOTE — Patient Instructions (Addendum)
You have been scheduled for an endoscopy and colonoscopy with propofol. Please follow the written instructions given to you at your visit today. Please pick up your prep at the pharmacy within the next 1-3 days. If you use inhalers (even only as needed), please bring them with you on the day of your procedure.    

## 2012-06-21 ENCOUNTER — Telehealth: Payer: Self-pay

## 2012-06-21 NOTE — Telephone Encounter (Signed)
06/21/2012    RE: HAYGEN ZEBROWSKI DOB: 10/17/1930 MRN: [id number]   Dear Dr. Clifton James:    We have scheduled the above patient for an endoscopic procedure. Our records show that he is on anticoagulation therapy.   Please advise as to how long the patient may come off his therapy of Plavix prior to the procedure, which is scheduled for 08-01-12.  Please fax back/ or route the completed form to [facility] at [phone].   Sincerely,  Gracy Racer

## 2012-06-22 ENCOUNTER — Encounter (HOSPITAL_COMMUNITY)
Admission: RE | Admit: 2012-06-22 | Discharge: 2012-06-22 | Disposition: A | Discharge status: Home / Self Care | Payer: Self-pay | Source: Ambulatory Visit | Attending: Cardiovascular Disease | Admitting: Cardiovascular Disease

## 2012-06-22 DIAGNOSIS — I2510 Atherosclerotic heart disease of native coronary artery without angina pectoris: Secondary | ICD-10-CM | POA: Insufficient documentation

## 2012-06-22 DIAGNOSIS — Z9861 Coronary angioplasty status: Secondary | ICD-10-CM | POA: Insufficient documentation

## 2012-06-22 DIAGNOSIS — Z5189 Encounter for other specified aftercare: Secondary | ICD-10-CM | POA: Insufficient documentation

## 2012-06-22 DIAGNOSIS — E785 Hyperlipidemia, unspecified: Secondary | ICD-10-CM | POA: Insufficient documentation

## 2012-06-22 DIAGNOSIS — Z8249 Family history of ischemic heart disease and other diseases of the circulatory system: Secondary | ICD-10-CM | POA: Insufficient documentation

## 2012-06-25 NOTE — Telephone Encounter (Signed)
Edward [name], Mr Abhimanyu can hold Plavix for one week before his procedure. Do we have paperwork to fill out to fax back in the blue folder? chris

## 2012-06-27 ENCOUNTER — Encounter (HOSPITAL_COMMUNITY)
Admission: RE | Admit: 2012-06-27 | Discharge: 2012-06-27 | Disposition: A | Discharge status: Home / Self Care | Payer: Self-pay | Source: Ambulatory Visit | Attending: Cardiovascular Disease | Admitting: Cardiovascular Disease

## 2012-06-27 NOTE — Telephone Encounter (Signed)
No additional paperwork sent to office to be completed. Will route this note to Alonna Buckler ,CMA

## 2012-06-28 ENCOUNTER — Telehealth: Payer: Self-pay

## 2012-06-28 NOTE — Telephone Encounter (Signed)
Left message

## 2012-06-29 ENCOUNTER — Encounter (HOSPITAL_COMMUNITY)
Admission: RE | Admit: 2012-06-29 | Discharge: 2012-06-29 | Disposition: A | Discharge status: Home / Self Care | Payer: Self-pay | Source: Ambulatory Visit | Attending: Cardiovascular Disease | Admitting: Cardiovascular Disease

## 2012-06-29 ENCOUNTER — Telehealth: Payer: Self-pay

## 2012-06-29 NOTE — Telephone Encounter (Signed)
Told pt that, per Dr. Sanjuana Kava, he could hold his Plavix for one week prior to his procedure.  He understood and agreed

## 2012-07-04 ENCOUNTER — Encounter (HOSPITAL_COMMUNITY)
Admission: RE | Admit: 2012-07-04 | Discharge: 2012-07-04 | Disposition: A | Discharge status: Home / Self Care | Payer: Self-pay | Source: Ambulatory Visit | Attending: Cardiovascular Disease | Admitting: Cardiovascular Disease

## 2012-07-06 ENCOUNTER — Encounter (HOSPITAL_COMMUNITY)
Admission: RE | Admit: 2012-07-06 | Discharge: 2012-07-06 | Disposition: A | Discharge status: Home / Self Care | Payer: Self-pay | Source: Ambulatory Visit | Attending: Cardiovascular Disease | Admitting: Cardiovascular Disease

## 2012-07-11 ENCOUNTER — Encounter (HOSPITAL_COMMUNITY)
Admission: RE | Admit: 2012-07-11 | Discharge: 2012-07-11 | Disposition: A | Discharge status: Home / Self Care | Payer: Self-pay | Source: Ambulatory Visit | Attending: Cardiovascular Disease | Admitting: Cardiovascular Disease

## 2012-07-13 ENCOUNTER — Encounter (HOSPITAL_COMMUNITY)
Admission: RE | Admit: 2012-07-13 | Discharge: 2012-07-13 | Disposition: A | Discharge status: Home / Self Care | Payer: Self-pay | Source: Ambulatory Visit | Attending: Cardiovascular Disease | Admitting: Cardiovascular Disease

## 2012-07-18 ENCOUNTER — Encounter (HOSPITAL_COMMUNITY)
Admission: RE | Admit: 2012-07-18 | Discharge: 2012-07-18 | Disposition: A | Discharge status: Home / Self Care | Payer: Self-pay | Source: Ambulatory Visit | Attending: Cardiovascular Disease | Admitting: Cardiovascular Disease

## 2012-07-20 ENCOUNTER — Encounter (HOSPITAL_COMMUNITY)
Admission: RE | Admit: 2012-07-20 | Discharge: 2012-07-20 | Disposition: A | Discharge status: Home / Self Care | Payer: Self-pay | Source: Ambulatory Visit | Attending: Cardiovascular Disease | Admitting: Cardiovascular Disease

## 2012-07-25 ENCOUNTER — Encounter (HOSPITAL_COMMUNITY)
Admission: RE | Admit: 2012-07-25 | Discharge: 2012-07-25 | Disposition: A | Discharge status: Home / Self Care | Payer: Self-pay | Source: Ambulatory Visit | Attending: Cardiovascular Disease | Admitting: Cardiovascular Disease

## 2012-07-25 DIAGNOSIS — I2510 Atherosclerotic heart disease of native coronary artery without angina pectoris: Secondary | ICD-10-CM | POA: Insufficient documentation

## 2012-07-25 DIAGNOSIS — Z9861 Coronary angioplasty status: Secondary | ICD-10-CM | POA: Insufficient documentation

## 2012-07-25 DIAGNOSIS — Z8249 Family history of ischemic heart disease and other diseases of the circulatory system: Secondary | ICD-10-CM | POA: Insufficient documentation

## 2012-07-25 DIAGNOSIS — Z5189 Encounter for other specified aftercare: Secondary | ICD-10-CM | POA: Insufficient documentation

## 2012-07-25 DIAGNOSIS — E785 Hyperlipidemia, unspecified: Secondary | ICD-10-CM | POA: Insufficient documentation

## 2012-07-27 ENCOUNTER — Encounter (HOSPITAL_COMMUNITY)
Admission: RE | Admit: 2012-07-27 | Discharge: 2012-07-27 | Disposition: A | Discharge status: Home / Self Care | Payer: Self-pay | Source: Ambulatory Visit | Attending: Cardiovascular Disease | Admitting: Cardiovascular Disease

## 2012-08-01 ENCOUNTER — Encounter (HOSPITAL_COMMUNITY): Payer: Self-pay

## 2012-08-01 ENCOUNTER — Encounter: Payer: Self-pay | Admitting: Internal Medicine

## 2012-08-01 ENCOUNTER — Ambulatory Visit (AMBULATORY_SURGERY_CENTER): Payer: Medicare Other | Admitting: Internal Medicine

## 2012-08-01 VITALS — BP 149/88 | HR 50 | Temp 98.2°F | Resp 19 | Ht 65.0 in | Wt 105.0 lb

## 2012-08-01 DIAGNOSIS — D126 Benign neoplasm of colon, unspecified: Secondary | ICD-10-CM

## 2012-08-01 DIAGNOSIS — R634 Abnormal weight loss: Secondary | ICD-10-CM

## 2012-08-01 DIAGNOSIS — Z86010 Personal history of colonic polyps: Secondary | ICD-10-CM

## 2012-08-01 DIAGNOSIS — K5900 Constipation, unspecified: Secondary | ICD-10-CM

## 2012-08-01 DIAGNOSIS — Z1211 Encounter for screening for malignant neoplasm of colon: Secondary | ICD-10-CM

## 2012-08-01 MED ORDER — SODIUM CHLORIDE 0.9 % IV SOLN: 500.0000 mL | INTRAVENOUS | Status: DC

## 2012-08-01 NOTE — Op Note (Signed)
[facility] [address] [place], [address]   COLONOSCOPY PROCEDURE REPORT  PATIENT: [name], [name]  MR#: [id number] BIRTHDATE: 08-Oct-1930 , 81  yrs. old GENDER: Male ENDOSCOPIST: Roxy Cedar, MD REFERRED XB:[id number] Program Recall PROCEDURE DATE:  08/01/2012 PROCEDURE:   Colonoscopy with snare polypectomy    x 4 ASA CLASS:   Class III INDICATIONS:high risk screening; patient's personal history of adenomatous colon polyps (2008 w/ TA). MEDICATIONS: MAC sedation, administered by CRNA and propofol (Diprivan) 220mg  IV  DESCRIPTION OF PROCEDURE:   After the risks benefits and alternatives of the procedure were thoroughly explained, informed consent was obtained.  A digital rectal exam revealed no abnormalities of the rectum.   The LB [id number]  endoscope was introduced through the anus and advanced to the cecum, which was identified by both the appendix and ileocecal valve. No adverse events experienced.   The quality of the prep was good, using MoviPrep  The instrument was then slowly withdrawn as the colon was fully examined.      COLON FINDINGS: Three diminutive polyps were found in the ascending colon.  A polypectomy was performed with a cold snare.  The resection was complete and the polyp tissue was completely retrieved.   A diminutive polyp was found in the transverse colon. A polypectomy was performed with a cold snare.  The resection was complete and the polyp tissue was completely retrieved.   Moderate diverticulosis was noted throughout the entire examined colon. Retroflexed views revealed no abnormalities. The time to cecum=2 minutes 27 seconds.  Withdrawal time=13 minutes 38 seconds.  The scope was withdrawn and the procedure completed. COMPLICATIONS: There were no complications.  ENDOSCOPIC IMPRESSION: 1.   Three diminutive polyps were found in the ascending colon; polypectomy was performed with a cold snare 2.   Diminutive  polyp was found in the transverse colon; polypectomy was performed with a cold snare 3.   Moderate diverticulosis was noted throughout the entire examined colon  RECOMMENDATIONS: 1. Return to the care of your primary provider.  GI follow up as needed   eSigned:  Roxy Cedar, MD 08/01/2012 10:48 AM   cc: Adrian Prince, MD and The Patient   PATIENT NAME:  Edward, [name] MR#: [id number]

## 2012-08-01 NOTE — Progress Notes (Signed)
Patient did not experience any of the following events: a burn prior to discharge; a fall within the facility; wrong site/side/patient/procedure/implant event; or a hospital transfer or hospital admission upon discharge from the facility. (G8907) Patient did not have preoperative order for IV antibiotic SSI prophylaxis. (G8918)  

## 2012-08-01 NOTE — Patient Instructions (Addendum)
YOU HAD AN ENDOSCOPIC PROCEDURE TODAY AT THE [facility]: Refer to the procedure report that was given to you for any specific questions about what was found during the examination.  If the procedure report does not answer your questions, please call your gastroenterologist to clarify.  If you requested that your care partner not be given the details of your procedure findings, then the procedure report has been included in a sealed envelope for you to review at your convenience later.  YOU SHOULD EXPECT: Some feelings of bloating in the abdomen. Passage of more gas than usual.  Walking can help get rid of the air that was put into your GI tract during the procedure and reduce the bloating. If you had a lower endoscopy (such as a colonoscopy or flexible sigmoidoscopy) you may notice spotting of blood in your stool or on the toilet paper. If you underwent a bowel prep for your procedure, then you may not have a normal bowel movement for a few days.  DIET: Your first meal following the procedure should be a light meal and then it is ok to progress to your normal diet.  A half-sandwich or bowl of soup is an example of a good first meal.  Heavy or fried foods are harder to digest and may make you feel nauseous or bloated.  Likewise meals heavy in dairy and vegetables can cause extra gas to form and this can also increase the bloating.  Drink plenty of fluids but you should avoid alcoholic beverages for 24 hours.  ACTIVITY: Your care partner should take you home directly after the procedure.  You should plan to take it easy, moving slowly for the rest of the day.  You can resume normal activity the day after the procedure however you should NOT DRIVE or use heavy machinery for 24 hours (because of the sedation medicines used during the test).    SYMPTOMS TO REPORT IMMEDIATELY: A gastroenterologist can be reached at any hour.  During normal business hours, 8:30 AM to 5:00 PM Monday through Friday,  call [phone].  After hours and on weekends, please call the GI answering service at [phone] who will take a message and have the physician on call contact you.   Following lower endoscopy (colonoscopy or flexible sigmoidoscopy):  Excessive amounts of blood in the stool  Significant tenderness or worsening of abdominal pains  Swelling of the abdomen that is new, acute  Fever of 100F or higher  Following upper endoscopy (EGD)  Vomiting of blood or coffee ground material  New chest pain or pain under the shoulder blades  Painful or persistently difficult swallowing  New shortness of breath  Fever of 100F or higher  Black, tarry-looking stools  FOLLOW UP: If any biopsies were taken you will be contacted by phone or by letter within the next 1-3 weeks.  Call your gastroenterologist if you have not heard about the biopsies in 3 weeks.  Our staff will call the home number listed on your records the next business day following your procedure to check on you and address any questions or concerns that you may have at that time regarding the information given to you following your procedure. This is a courtesy call and so if there is no answer at the home number and we have not heard from you through the emergency physician on call, we will assume that you have returned to your regular daily activities without incident.  SIGNATURES/CONFIDENTIALITY: You and/or your care   partner have signed paperwork which will be entered into your electronic medical record.  These signatures attest to the fact that that the information above on your After Visit Summary has been reviewed and is understood.  Full responsibility of the confidentiality of this discharge information lies with you and/or your care-partner.  

## 2012-08-02 ENCOUNTER — Telehealth: Payer: Self-pay | Admitting: *Deleted

## 2012-08-02 NOTE — Telephone Encounter (Signed)
  Follow up Call-  Call back number 08/01/2012  Post procedure Call Back phone  # [phone]  Permission to leave phone message Yes     Patient questions:  Do you have a fever, pain , or abdominal swelling? no Pain Score  0 *  Have you tolerated food without any problems? yes  Have you been able to return to your normal activities? yes  Do you have any questions about your discharge instructions: Diet   no Medications  no Follow up visit  no  Do you have questions or concerns about your Care? no  Actions: * If pain score is 4 or above: No action needed, pain <4.

## 2012-08-03 ENCOUNTER — Encounter (HOSPITAL_COMMUNITY): Payer: Self-pay

## 2012-08-07 ENCOUNTER — Encounter: Payer: Self-pay | Admitting: Internal Medicine

## 2012-08-08 ENCOUNTER — Encounter (HOSPITAL_COMMUNITY)
Admission: RE | Admit: 2012-08-08 | Discharge: 2012-08-08 | Disposition: A | Discharge status: Home / Self Care | Payer: Self-pay | Source: Ambulatory Visit | Attending: Cardiovascular Disease | Admitting: Cardiovascular Disease

## 2012-08-10 ENCOUNTER — Encounter (HOSPITAL_COMMUNITY)
Admission: RE | Admit: 2012-08-10 | Discharge: 2012-08-10 | Disposition: A | Discharge status: Home / Self Care | Payer: Self-pay | Source: Ambulatory Visit | Attending: Cardiovascular Disease | Admitting: Cardiovascular Disease

## 2012-08-10 ENCOUNTER — Other Ambulatory Visit: Payer: Self-pay | Admitting: Cardiovascular Disease

## 2012-08-10 NOTE — Telephone Encounter (Signed)
Refilled metoprolol 

## 2012-08-15 ENCOUNTER — Encounter (HOSPITAL_COMMUNITY)
Admission: RE | Admit: 2012-08-15 | Discharge: 2012-08-15 | Disposition: A | Discharge status: Home / Self Care | Payer: Self-pay | Source: Ambulatory Visit | Attending: Cardiovascular Disease | Admitting: Cardiovascular Disease

## 2012-08-17 ENCOUNTER — Encounter (HOSPITAL_COMMUNITY)
Admission: RE | Admit: 2012-08-17 | Discharge: 2012-08-17 | Disposition: A | Discharge status: Home / Self Care | Payer: Self-pay | Source: Ambulatory Visit | Attending: Cardiovascular Disease | Admitting: Cardiovascular Disease

## 2012-08-22 ENCOUNTER — Encounter (HOSPITAL_COMMUNITY)
Admission: RE | Admit: 2012-08-22 | Discharge: 2012-08-22 | Disposition: A | Discharge status: Home / Self Care | Payer: Self-pay | Source: Ambulatory Visit | Attending: Cardiovascular Disease | Admitting: Cardiovascular Disease

## 2012-08-22 DIAGNOSIS — Z8249 Family history of ischemic heart disease and other diseases of the circulatory system: Secondary | ICD-10-CM | POA: Insufficient documentation

## 2012-08-22 DIAGNOSIS — Z9861 Coronary angioplasty status: Secondary | ICD-10-CM | POA: Insufficient documentation

## 2012-08-22 DIAGNOSIS — Z5189 Encounter for other specified aftercare: Secondary | ICD-10-CM | POA: Insufficient documentation

## 2012-08-22 DIAGNOSIS — E785 Hyperlipidemia, unspecified: Secondary | ICD-10-CM | POA: Insufficient documentation

## 2012-08-22 DIAGNOSIS — I2510 Atherosclerotic heart disease of native coronary artery without angina pectoris: Secondary | ICD-10-CM | POA: Insufficient documentation

## 2012-08-24 ENCOUNTER — Encounter (HOSPITAL_COMMUNITY)
Admission: RE | Admit: 2012-08-24 | Discharge: 2012-08-24 | Disposition: A | Discharge status: Home / Self Care | Payer: Self-pay | Source: Ambulatory Visit | Attending: Cardiovascular Disease | Admitting: Cardiovascular Disease

## 2012-08-29 ENCOUNTER — Encounter (HOSPITAL_COMMUNITY)
Admission: RE | Admit: 2012-08-29 | Discharge: 2012-08-29 | Disposition: A | Discharge status: Home / Self Care | Payer: Self-pay | Source: Ambulatory Visit | Attending: Cardiovascular Disease | Admitting: Cardiovascular Disease

## 2012-08-31 ENCOUNTER — Encounter (HOSPITAL_COMMUNITY)
Admission: RE | Admit: 2012-08-31 | Discharge: 2012-08-31 | Disposition: A | Discharge status: Home / Self Care | Payer: Self-pay | Source: Ambulatory Visit | Attending: Cardiovascular Disease | Admitting: Cardiovascular Disease

## 2012-09-05 ENCOUNTER — Encounter (HOSPITAL_COMMUNITY)
Admission: RE | Admit: 2012-09-05 | Discharge: 2012-09-05 | Disposition: A | Discharge status: Home / Self Care | Payer: Self-pay | Source: Ambulatory Visit | Attending: Cardiovascular Disease | Admitting: Cardiovascular Disease

## 2012-09-07 ENCOUNTER — Encounter (HOSPITAL_COMMUNITY)
Admission: RE | Admit: 2012-09-07 | Discharge: 2012-09-07 | Disposition: A | Discharge status: Home / Self Care | Payer: Self-pay | Source: Ambulatory Visit | Attending: Cardiovascular Disease | Admitting: Cardiovascular Disease

## 2012-09-09 ENCOUNTER — Other Ambulatory Visit: Payer: Self-pay | Admitting: Cardiovascular Disease

## 2012-09-12 ENCOUNTER — Encounter (HOSPITAL_COMMUNITY)
Admission: RE | Admit: 2012-09-12 | Discharge: 2012-09-12 | Disposition: A | Discharge status: Home / Self Care | Payer: Self-pay | Source: Ambulatory Visit | Attending: Cardiovascular Disease | Admitting: Cardiovascular Disease

## 2012-09-14 ENCOUNTER — Encounter (HOSPITAL_COMMUNITY)
Admission: RE | Admit: 2012-09-14 | Discharge: 2012-09-14 | Disposition: A | Discharge status: Home / Self Care | Payer: Self-pay | Source: Ambulatory Visit | Attending: Cardiovascular Disease | Admitting: Cardiovascular Disease

## 2012-09-19 ENCOUNTER — Encounter (HOSPITAL_COMMUNITY)
Admission: RE | Admit: 2012-09-19 | Discharge: 2012-09-19 | Disposition: A | Discharge status: Home / Self Care | Payer: Self-pay | Source: Ambulatory Visit | Attending: Cardiovascular Disease | Admitting: Cardiovascular Disease

## 2012-09-21 ENCOUNTER — Encounter (HOSPITAL_COMMUNITY)
Admission: RE | Admit: 2012-09-21 | Discharge: 2012-09-21 | Disposition: A | Discharge status: Home / Self Care | Payer: Self-pay | Source: Ambulatory Visit | Attending: Cardiovascular Disease | Admitting: Cardiovascular Disease

## 2012-09-25 DIAGNOSIS — Z5189 Encounter for other specified aftercare: Secondary | ICD-10-CM | POA: Insufficient documentation

## 2012-09-25 DIAGNOSIS — I2510 Atherosclerotic heart disease of native coronary artery without angina pectoris: Secondary | ICD-10-CM | POA: Insufficient documentation

## 2012-09-25 DIAGNOSIS — E785 Hyperlipidemia, unspecified: Secondary | ICD-10-CM | POA: Insufficient documentation

## 2012-09-25 DIAGNOSIS — Z8249 Family history of ischemic heart disease and other diseases of the circulatory system: Secondary | ICD-10-CM | POA: Insufficient documentation

## 2012-09-25 DIAGNOSIS — Z9861 Coronary angioplasty status: Secondary | ICD-10-CM | POA: Insufficient documentation

## 2012-09-26 ENCOUNTER — Encounter (HOSPITAL_COMMUNITY)
Admission: RE | Admit: 2012-09-26 | Discharge: 2012-09-26 | Disposition: A | Discharge status: Home / Self Care | Payer: Self-pay | Source: Ambulatory Visit | Attending: Cardiovascular Disease | Admitting: Cardiovascular Disease

## 2012-09-28 ENCOUNTER — Encounter (HOSPITAL_COMMUNITY)
Admission: RE | Admit: 2012-09-28 | Discharge: 2012-09-28 | Disposition: A | Discharge status: Home / Self Care | Payer: Self-pay | Source: Ambulatory Visit | Attending: Cardiovascular Disease | Admitting: Cardiovascular Disease

## 2012-10-03 ENCOUNTER — Encounter (HOSPITAL_COMMUNITY)
Admission: RE | Admit: 2012-10-03 | Discharge: 2012-10-03 | Disposition: A | Discharge status: Home / Self Care | Payer: Self-pay | Source: Ambulatory Visit | Attending: Cardiovascular Disease | Admitting: Cardiovascular Disease

## 2012-10-05 ENCOUNTER — Encounter (HOSPITAL_COMMUNITY)
Admission: RE | Admit: 2012-10-05 | Discharge: 2012-10-05 | Disposition: A | Discharge status: Home / Self Care | Payer: Self-pay | Source: Ambulatory Visit | Attending: Cardiovascular Disease | Admitting: Cardiovascular Disease

## 2012-10-10 ENCOUNTER — Encounter (HOSPITAL_COMMUNITY)
Admission: RE | Admit: 2012-10-10 | Discharge: 2012-10-10 | Disposition: A | Discharge status: Home / Self Care | Payer: Self-pay | Source: Ambulatory Visit | Attending: Cardiovascular Disease | Admitting: Cardiovascular Disease

## 2012-10-12 ENCOUNTER — Encounter (HOSPITAL_COMMUNITY)
Admission: RE | Admit: 2012-10-12 | Discharge: 2012-10-12 | Disposition: A | Discharge status: Home / Self Care | Payer: Self-pay | Source: Ambulatory Visit | Attending: Cardiovascular Disease | Admitting: Cardiovascular Disease

## 2012-10-17 ENCOUNTER — Encounter (HOSPITAL_COMMUNITY): Payer: Self-pay

## 2012-10-24 ENCOUNTER — Encounter (HOSPITAL_COMMUNITY)
Admission: RE | Admit: 2012-10-24 | Discharge: 2012-10-24 | Disposition: A | Discharge status: Home / Self Care | Payer: Self-pay | Source: Ambulatory Visit | Attending: Cardiovascular Disease | Admitting: Cardiovascular Disease

## 2012-10-24 DIAGNOSIS — I2510 Atherosclerotic heart disease of native coronary artery without angina pectoris: Secondary | ICD-10-CM | POA: Insufficient documentation

## 2012-10-24 DIAGNOSIS — E785 Hyperlipidemia, unspecified: Secondary | ICD-10-CM | POA: Insufficient documentation

## 2012-10-24 DIAGNOSIS — Z8249 Family history of ischemic heart disease and other diseases of the circulatory system: Secondary | ICD-10-CM | POA: Insufficient documentation

## 2012-10-24 DIAGNOSIS — Z9861 Coronary angioplasty status: Secondary | ICD-10-CM | POA: Insufficient documentation

## 2012-10-24 DIAGNOSIS — Z5189 Encounter for other specified aftercare: Secondary | ICD-10-CM | POA: Insufficient documentation

## 2012-10-26 ENCOUNTER — Encounter (HOSPITAL_COMMUNITY)
Admission: RE | Admit: 2012-10-26 | Discharge: 2012-10-26 | Disposition: A | Discharge status: Home / Self Care | Payer: Self-pay | Source: Ambulatory Visit | Attending: Cardiovascular Disease | Admitting: Cardiovascular Disease

## 2012-10-31 ENCOUNTER — Encounter (HOSPITAL_COMMUNITY)
Admission: RE | Admit: 2012-10-31 | Discharge: 2012-10-31 | Disposition: A | Discharge status: Home / Self Care | Payer: Self-pay | Source: Ambulatory Visit | Attending: Cardiovascular Disease | Admitting: Cardiovascular Disease

## 2012-11-02 ENCOUNTER — Encounter (HOSPITAL_COMMUNITY)
Admission: RE | Admit: 2012-11-02 | Discharge: 2012-11-02 | Disposition: A | Discharge status: Home / Self Care | Payer: Self-pay | Source: Ambulatory Visit | Attending: Cardiovascular Disease | Admitting: Cardiovascular Disease

## 2012-11-07 ENCOUNTER — Encounter (HOSPITAL_COMMUNITY)
Admission: RE | Admit: 2012-11-07 | Discharge: 2012-11-07 | Disposition: A | Discharge status: Home / Self Care | Payer: Self-pay | Source: Ambulatory Visit | Attending: Cardiovascular Disease | Admitting: Cardiovascular Disease

## 2012-11-09 ENCOUNTER — Encounter (HOSPITAL_COMMUNITY)
Admission: RE | Admit: 2012-11-09 | Discharge: 2012-11-09 | Disposition: A | Discharge status: Home / Self Care | Payer: Self-pay | Source: Ambulatory Visit | Attending: Cardiovascular Disease | Admitting: Cardiovascular Disease

## 2012-11-14 ENCOUNTER — Encounter (HOSPITAL_COMMUNITY)
Admission: RE | Admit: 2012-11-14 | Discharge: 2012-11-14 | Disposition: A | Discharge status: Home / Self Care | Payer: Self-pay | Source: Ambulatory Visit | Attending: Cardiovascular Disease | Admitting: Cardiovascular Disease

## 2012-11-16 ENCOUNTER — Encounter (HOSPITAL_COMMUNITY)
Admission: RE | Admit: 2012-11-16 | Discharge: 2012-11-16 | Disposition: A | Discharge status: Home / Self Care | Payer: Self-pay | Source: Ambulatory Visit | Attending: Cardiovascular Disease | Admitting: Cardiovascular Disease

## 2012-11-21 ENCOUNTER — Encounter (HOSPITAL_COMMUNITY)
Admission: RE | Admit: 2012-11-21 | Discharge: 2012-11-21 | Disposition: A | Discharge status: Home / Self Care | Payer: Self-pay | Source: Ambulatory Visit | Attending: Cardiovascular Disease | Admitting: Cardiovascular Disease

## 2012-11-23 ENCOUNTER — Encounter (HOSPITAL_COMMUNITY)
Admission: RE | Admit: 2012-11-23 | Discharge: 2012-11-23 | Disposition: A | Discharge status: Home / Self Care | Payer: Self-pay | Source: Ambulatory Visit | Attending: Cardiovascular Disease | Admitting: Cardiovascular Disease

## 2012-11-23 DIAGNOSIS — I2510 Atherosclerotic heart disease of native coronary artery without angina pectoris: Secondary | ICD-10-CM | POA: Insufficient documentation

## 2012-11-23 DIAGNOSIS — E785 Hyperlipidemia, unspecified: Secondary | ICD-10-CM | POA: Insufficient documentation

## 2012-11-23 DIAGNOSIS — Z5189 Encounter for other specified aftercare: Secondary | ICD-10-CM | POA: Insufficient documentation

## 2012-11-23 DIAGNOSIS — Z9861 Coronary angioplasty status: Secondary | ICD-10-CM | POA: Insufficient documentation

## 2012-11-23 DIAGNOSIS — Z8249 Family history of ischemic heart disease and other diseases of the circulatory system: Secondary | ICD-10-CM | POA: Insufficient documentation

## 2012-11-27 ENCOUNTER — Telehealth: Payer: Self-pay | Admitting: Cardiovascular Disease

## 2012-11-27 ENCOUNTER — Encounter: Payer: Self-pay | Admitting: Cardiovascular Disease

## 2012-11-27 ENCOUNTER — Ambulatory Visit (INDEPENDENT_AMBULATORY_CARE_PROVIDER_SITE_OTHER): Payer: Medicare Other | Admitting: Cardiovascular Disease

## 2012-11-27 VITALS — BP 138/70 | HR 54 | Ht 65.0 in | Wt 105.0 lb

## 2012-11-27 DIAGNOSIS — I2510 Atherosclerotic heart disease of native coronary artery without angina pectoris: Secondary | ICD-10-CM

## 2012-11-27 NOTE — Telephone Encounter (Signed)
Pt would like the results from today sent to Dr. Evlyn Kanner

## 2012-11-27 NOTE — Patient Instructions (Addendum)
Your physician wants you to follow-up in:  12 months.  You will receive a reminder letter in the mail two months in advance. If you don't receive a letter, please call our office to schedule the follow-up appointment.   
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
## 2012-11-27 NOTE — Telephone Encounter (Signed)
Will forward office visit note to Dr.South

## 2012-11-28 ENCOUNTER — Encounter (HOSPITAL_COMMUNITY): Payer: Self-pay

## 2012-11-30 ENCOUNTER — Encounter (HOSPITAL_COMMUNITY): Payer: Self-pay

## 2012-12-05 ENCOUNTER — Encounter (HOSPITAL_COMMUNITY): Payer: Self-pay

## 2012-12-07 ENCOUNTER — Encounter (HOSPITAL_COMMUNITY): Payer: Self-pay

## 2012-12-12 ENCOUNTER — Encounter (HOSPITAL_COMMUNITY): Payer: Self-pay

## 2012-12-14 ENCOUNTER — Encounter (HOSPITAL_COMMUNITY)
Admission: RE | Admit: 2012-12-14 | Discharge: 2012-12-14 | Disposition: A | Discharge status: Home / Self Care | Payer: Self-pay | Source: Ambulatory Visit | Attending: Cardiovascular Disease | Admitting: Cardiovascular Disease

## 2012-12-19 ENCOUNTER — Encounter (HOSPITAL_COMMUNITY)
Admission: RE | Admit: 2012-12-19 | Discharge: 2012-12-19 | Disposition: A | Discharge status: Home / Self Care | Payer: Self-pay | Source: Ambulatory Visit | Attending: Cardiovascular Disease | Admitting: Cardiovascular Disease

## 2012-12-21 ENCOUNTER — Encounter (HOSPITAL_COMMUNITY): Payer: Self-pay

## 2012-12-26 ENCOUNTER — Encounter (HOSPITAL_COMMUNITY)
Admission: RE | Admit: 2012-12-26 | Discharge: 2012-12-26 | Disposition: A | Discharge status: Home / Self Care | Payer: Self-pay | Source: Ambulatory Visit | Attending: Cardiovascular Disease | Admitting: Cardiovascular Disease

## 2012-12-26 DIAGNOSIS — E785 Hyperlipidemia, unspecified: Secondary | ICD-10-CM | POA: Insufficient documentation

## 2012-12-26 DIAGNOSIS — Z9861 Coronary angioplasty status: Secondary | ICD-10-CM | POA: Insufficient documentation

## 2012-12-26 DIAGNOSIS — I2510 Atherosclerotic heart disease of native coronary artery without angina pectoris: Secondary | ICD-10-CM | POA: Insufficient documentation

## 2012-12-26 DIAGNOSIS — Z5189 Encounter for other specified aftercare: Secondary | ICD-10-CM | POA: Insufficient documentation

## 2012-12-26 DIAGNOSIS — Z8249 Family history of ischemic heart disease and other diseases of the circulatory system: Secondary | ICD-10-CM | POA: Insufficient documentation

## 2012-12-28 ENCOUNTER — Encounter (HOSPITAL_COMMUNITY)
Admission: RE | Admit: 2012-12-28 | Discharge: 2012-12-28 | Disposition: A | Discharge status: Home / Self Care | Payer: Self-pay | Source: Ambulatory Visit | Attending: Cardiovascular Disease | Admitting: Cardiovascular Disease

## 2013-01-02 ENCOUNTER — Encounter (HOSPITAL_COMMUNITY)
Admission: RE | Admit: 2013-01-02 | Discharge: 2013-01-02 | Disposition: A | Discharge status: Home / Self Care | Payer: Self-pay | Source: Ambulatory Visit | Attending: Cardiovascular Disease | Admitting: Cardiovascular Disease

## 2013-01-04 ENCOUNTER — Encounter (HOSPITAL_COMMUNITY): Payer: Self-pay

## 2013-01-09 ENCOUNTER — Encounter (HOSPITAL_COMMUNITY)
Admission: RE | Admit: 2013-01-09 | Discharge: 2013-01-09 | Disposition: A | Discharge status: Home / Self Care | Payer: Self-pay | Source: Ambulatory Visit | Attending: Cardiovascular Disease | Admitting: Cardiovascular Disease

## 2013-01-11 ENCOUNTER — Encounter (HOSPITAL_COMMUNITY)
Admission: RE | Admit: 2013-01-11 | Discharge: 2013-01-11 | Disposition: A | Discharge status: Home / Self Care | Payer: Self-pay | Source: Ambulatory Visit | Attending: Cardiovascular Disease | Admitting: Cardiovascular Disease

## 2013-01-16 ENCOUNTER — Encounter (HOSPITAL_COMMUNITY)
Admission: RE | Admit: 2013-01-16 | Discharge: 2013-01-16 | Disposition: A | Discharge status: Home / Self Care | Payer: Self-pay | Source: Ambulatory Visit | Attending: Cardiovascular Disease | Admitting: Cardiovascular Disease

## 2013-01-18 ENCOUNTER — Encounter (HOSPITAL_COMMUNITY)
Admission: RE | Admit: 2013-01-18 | Discharge: 2013-01-18 | Disposition: A | Discharge status: Home / Self Care | Payer: Self-pay | Source: Ambulatory Visit | Attending: Cardiovascular Disease | Admitting: Cardiovascular Disease

## 2013-01-23 ENCOUNTER — Encounter (HOSPITAL_COMMUNITY): Payer: Self-pay

## 2013-01-23 DIAGNOSIS — E785 Hyperlipidemia, unspecified: Secondary | ICD-10-CM | POA: Insufficient documentation

## 2013-01-23 DIAGNOSIS — Z9861 Coronary angioplasty status: Secondary | ICD-10-CM | POA: Insufficient documentation

## 2013-01-23 DIAGNOSIS — Z8249 Family history of ischemic heart disease and other diseases of the circulatory system: Secondary | ICD-10-CM | POA: Insufficient documentation

## 2013-01-23 DIAGNOSIS — I2510 Atherosclerotic heart disease of native coronary artery without angina pectoris: Secondary | ICD-10-CM | POA: Insufficient documentation

## 2013-01-23 DIAGNOSIS — Z5189 Encounter for other specified aftercare: Secondary | ICD-10-CM | POA: Insufficient documentation

## 2013-01-25 ENCOUNTER — Encounter (HOSPITAL_COMMUNITY)
Admission: RE | Admit: 2013-01-25 | Discharge: 2013-01-25 | Disposition: A | Discharge status: Home / Self Care | Payer: Self-pay | Source: Ambulatory Visit | Attending: Cardiovascular Disease | Admitting: Cardiovascular Disease

## 2013-01-30 ENCOUNTER — Encounter (HOSPITAL_COMMUNITY)
Admission: RE | Admit: 2013-01-30 | Discharge: 2013-01-30 | Disposition: A | Discharge status: Home / Self Care | Payer: Self-pay | Source: Ambulatory Visit | Attending: Cardiovascular Disease | Admitting: Cardiovascular Disease

## 2013-01-31 ENCOUNTER — Other Ambulatory Visit: Payer: Self-pay | Admitting: Cardiovascular Disease

## 2013-02-01 ENCOUNTER — Encounter (HOSPITAL_COMMUNITY)
Admission: RE | Admit: 2013-02-01 | Discharge: 2013-02-01 | Disposition: A | Discharge status: Home / Self Care | Payer: Self-pay | Source: Ambulatory Visit | Attending: Cardiovascular Disease | Admitting: Cardiovascular Disease

## 2013-02-06 ENCOUNTER — Encounter (HOSPITAL_COMMUNITY): Payer: Self-pay

## 2013-02-08 ENCOUNTER — Encounter (HOSPITAL_COMMUNITY)
Admission: RE | Admit: 2013-02-08 | Discharge: 2013-02-08 | Disposition: A | Discharge status: Home / Self Care | Payer: Self-pay | Source: Ambulatory Visit | Attending: Cardiovascular Disease | Admitting: Cardiovascular Disease

## 2013-02-13 ENCOUNTER — Encounter (HOSPITAL_COMMUNITY)
Admission: RE | Admit: 2013-02-13 | Discharge: 2013-02-13 | Disposition: A | Discharge status: Home / Self Care | Payer: Self-pay | Source: Ambulatory Visit | Attending: Cardiovascular Disease | Admitting: Cardiovascular Disease

## 2013-02-15 ENCOUNTER — Encounter (HOSPITAL_COMMUNITY)
Admission: RE | Admit: 2013-02-15 | Discharge: 2013-02-15 | Disposition: A | Discharge status: Home / Self Care | Payer: Self-pay | Source: Ambulatory Visit | Attending: Cardiovascular Disease | Admitting: Cardiovascular Disease

## 2013-02-20 ENCOUNTER — Encounter (HOSPITAL_COMMUNITY)
Admission: RE | Admit: 2013-02-20 | Discharge: 2013-02-20 | Disposition: A | Discharge status: Home / Self Care | Payer: Self-pay | Source: Ambulatory Visit | Attending: Cardiovascular Disease | Admitting: Cardiovascular Disease

## 2013-02-20 DIAGNOSIS — E785 Hyperlipidemia, unspecified: Secondary | ICD-10-CM | POA: Insufficient documentation

## 2013-02-20 DIAGNOSIS — Z8249 Family history of ischemic heart disease and other diseases of the circulatory system: Secondary | ICD-10-CM | POA: Insufficient documentation

## 2013-02-20 DIAGNOSIS — Z9861 Coronary angioplasty status: Secondary | ICD-10-CM | POA: Insufficient documentation

## 2013-02-20 DIAGNOSIS — Z5189 Encounter for other specified aftercare: Secondary | ICD-10-CM | POA: Insufficient documentation

## 2013-02-20 DIAGNOSIS — I2510 Atherosclerotic heart disease of native coronary artery without angina pectoris: Secondary | ICD-10-CM | POA: Insufficient documentation

## 2013-02-22 ENCOUNTER — Encounter (HOSPITAL_COMMUNITY)
Admission: RE | Admit: 2013-02-22 | Discharge: 2013-02-22 | Disposition: A | Discharge status: Home / Self Care | Payer: Self-pay | Source: Ambulatory Visit | Attending: Cardiovascular Disease | Admitting: Cardiovascular Disease

## 2013-02-27 ENCOUNTER — Encounter (HOSPITAL_COMMUNITY)
Admission: RE | Admit: 2013-02-27 | Discharge: 2013-02-27 | Disposition: A | Discharge status: Home / Self Care | Payer: Self-pay | Source: Ambulatory Visit | Attending: Cardiovascular Disease | Admitting: Cardiovascular Disease

## 2013-03-01 ENCOUNTER — Encounter (HOSPITAL_COMMUNITY)
Admission: RE | Admit: 2013-03-01 | Discharge: 2013-03-01 | Disposition: A | Discharge status: Home / Self Care | Payer: Self-pay | Source: Ambulatory Visit | Attending: Cardiovascular Disease | Admitting: Cardiovascular Disease

## 2013-03-06 ENCOUNTER — Encounter (HOSPITAL_COMMUNITY)
Admission: RE | Admit: 2013-03-06 | Discharge: 2013-03-06 | Disposition: A | Discharge status: Home / Self Care | Payer: Self-pay | Source: Ambulatory Visit | Attending: Cardiovascular Disease | Admitting: Cardiovascular Disease

## 2013-03-08 ENCOUNTER — Encounter (HOSPITAL_COMMUNITY)
Admission: RE | Admit: 2013-03-08 | Discharge: 2013-03-08 | Disposition: A | Discharge status: Home / Self Care | Payer: Self-pay | Source: Ambulatory Visit | Attending: Cardiovascular Disease | Admitting: Cardiovascular Disease

## 2013-03-13 ENCOUNTER — Encounter (HOSPITAL_COMMUNITY)
Admission: RE | Admit: 2013-03-13 | Discharge: 2013-03-13 | Disposition: A | Discharge status: Home / Self Care | Payer: Self-pay | Source: Ambulatory Visit | Attending: Cardiovascular Disease | Admitting: Cardiovascular Disease

## 2013-03-15 ENCOUNTER — Encounter (HOSPITAL_COMMUNITY)
Admission: RE | Admit: 2013-03-15 | Discharge: 2013-03-15 | Disposition: A | Discharge status: Home / Self Care | Payer: Self-pay | Source: Ambulatory Visit | Attending: Cardiovascular Disease | Admitting: Cardiovascular Disease

## 2013-03-20 ENCOUNTER — Encounter (HOSPITAL_COMMUNITY)
Admission: RE | Admit: 2013-03-20 | Discharge: 2013-03-20 | Disposition: A | Discharge status: Home / Self Care | Payer: Self-pay | Source: Ambulatory Visit | Attending: Cardiovascular Disease | Admitting: Cardiovascular Disease

## 2013-03-22 ENCOUNTER — Encounter (HOSPITAL_COMMUNITY): Payer: Medicare Other

## 2013-03-22 DIAGNOSIS — E785 Hyperlipidemia, unspecified: Secondary | ICD-10-CM | POA: Insufficient documentation

## 2013-03-22 DIAGNOSIS — Z9861 Coronary angioplasty status: Secondary | ICD-10-CM | POA: Insufficient documentation

## 2013-03-22 DIAGNOSIS — Z8249 Family history of ischemic heart disease and other diseases of the circulatory system: Secondary | ICD-10-CM | POA: Insufficient documentation

## 2013-03-22 DIAGNOSIS — I2510 Atherosclerotic heart disease of native coronary artery without angina pectoris: Secondary | ICD-10-CM | POA: Insufficient documentation

## 2013-03-22 DIAGNOSIS — Z5189 Encounter for other specified aftercare: Secondary | ICD-10-CM | POA: Insufficient documentation

## 2013-03-27 ENCOUNTER — Encounter (HOSPITAL_COMMUNITY)
Admission: RE | Admit: 2013-03-27 | Discharge: 2013-03-27 | Disposition: A | Discharge status: Home / Self Care | Payer: Self-pay | Source: Ambulatory Visit | Attending: Cardiovascular Disease | Admitting: Cardiovascular Disease

## 2013-03-29 ENCOUNTER — Encounter (HOSPITAL_COMMUNITY)
Admission: RE | Admit: 2013-03-29 | Discharge: 2013-03-29 | Disposition: A | Discharge status: Home / Self Care | Payer: Self-pay | Source: Ambulatory Visit | Attending: Cardiovascular Disease | Admitting: Cardiovascular Disease

## 2013-04-03 ENCOUNTER — Encounter (HOSPITAL_COMMUNITY)
Admission: RE | Admit: 2013-04-03 | Discharge: 2013-04-03 | Disposition: A | Discharge status: Home / Self Care | Payer: Self-pay | Source: Ambulatory Visit | Attending: Cardiovascular Disease | Admitting: Cardiovascular Disease

## 2013-04-05 ENCOUNTER — Encounter (HOSPITAL_COMMUNITY)
Admission: RE | Admit: 2013-04-05 | Discharge: 2013-04-05 | Disposition: A | Discharge status: Home / Self Care | Payer: Self-pay | Source: Ambulatory Visit | Attending: Cardiovascular Disease | Admitting: Cardiovascular Disease

## 2013-04-10 ENCOUNTER — Encounter (HOSPITAL_COMMUNITY)
Admission: RE | Admit: 2013-04-10 | Discharge: 2013-04-10 | Disposition: A | Discharge status: Home / Self Care | Payer: Self-pay | Source: Ambulatory Visit | Attending: Cardiovascular Disease | Admitting: Cardiovascular Disease

## 2013-04-12 ENCOUNTER — Encounter (HOSPITAL_COMMUNITY)
Admission: RE | Admit: 2013-04-12 | Discharge: 2013-04-12 | Disposition: A | Discharge status: Home / Self Care | Payer: Self-pay | Source: Ambulatory Visit | Attending: Cardiovascular Disease | Admitting: Cardiovascular Disease

## 2013-04-17 ENCOUNTER — Encounter (HOSPITAL_COMMUNITY)
Admission: RE | Admit: 2013-04-17 | Discharge: 2013-04-17 | Disposition: A | Discharge status: Home / Self Care | Payer: Self-pay | Source: Ambulatory Visit | Attending: Cardiovascular Disease | Admitting: Cardiovascular Disease

## 2013-04-19 ENCOUNTER — Encounter (HOSPITAL_COMMUNITY)
Admission: RE | Admit: 2013-04-19 | Discharge: 2013-04-19 | Disposition: A | Discharge status: Home / Self Care | Payer: Self-pay | Source: Ambulatory Visit | Attending: Cardiovascular Disease | Admitting: Cardiovascular Disease

## 2013-04-24 ENCOUNTER — Encounter (HOSPITAL_COMMUNITY)
Admission: RE | Admit: 2013-04-24 | Discharge: 2013-04-24 | Disposition: A | Discharge status: Home / Self Care | Payer: Self-pay | Source: Ambulatory Visit | Attending: Cardiovascular Disease | Admitting: Cardiovascular Disease

## 2013-04-24 DIAGNOSIS — Z9861 Coronary angioplasty status: Secondary | ICD-10-CM | POA: Insufficient documentation

## 2013-04-24 DIAGNOSIS — I2510 Atherosclerotic heart disease of native coronary artery without angina pectoris: Secondary | ICD-10-CM | POA: Insufficient documentation

## 2013-04-24 DIAGNOSIS — E785 Hyperlipidemia, unspecified: Secondary | ICD-10-CM | POA: Insufficient documentation

## 2013-04-24 DIAGNOSIS — Z5189 Encounter for other specified aftercare: Secondary | ICD-10-CM | POA: Insufficient documentation

## 2013-04-24 DIAGNOSIS — Z8249 Family history of ischemic heart disease and other diseases of the circulatory system: Secondary | ICD-10-CM | POA: Insufficient documentation

## 2013-04-26 ENCOUNTER — Encounter (HOSPITAL_COMMUNITY)
Admission: RE | Admit: 2013-04-26 | Discharge: 2013-04-26 | Disposition: A | Discharge status: Home / Self Care | Payer: Self-pay | Source: Ambulatory Visit | Attending: Cardiovascular Disease | Admitting: Cardiovascular Disease

## 2013-05-01 ENCOUNTER — Encounter (HOSPITAL_COMMUNITY)
Admission: RE | Admit: 2013-05-01 | Discharge: 2013-05-01 | Disposition: A | Discharge status: Home / Self Care | Payer: Self-pay | Source: Ambulatory Visit | Attending: Cardiovascular Disease | Admitting: Cardiovascular Disease

## 2013-05-03 ENCOUNTER — Encounter (HOSPITAL_COMMUNITY)
Admission: RE | Admit: 2013-05-03 | Discharge: 2013-05-03 | Disposition: A | Discharge status: Home / Self Care | Payer: Self-pay | Source: Ambulatory Visit | Attending: Cardiovascular Disease | Admitting: Cardiovascular Disease

## 2013-05-08 ENCOUNTER — Encounter (HOSPITAL_COMMUNITY)
Admission: RE | Admit: 2013-05-08 | Discharge: 2013-05-08 | Disposition: A | Discharge status: Home / Self Care | Payer: Self-pay | Source: Ambulatory Visit | Attending: Cardiovascular Disease | Admitting: Cardiovascular Disease

## 2013-05-10 ENCOUNTER — Encounter (HOSPITAL_COMMUNITY)
Admission: RE | Admit: 2013-05-10 | Discharge: 2013-05-10 | Disposition: A | Discharge status: Home / Self Care | Payer: Self-pay | Source: Ambulatory Visit | Attending: Cardiovascular Disease | Admitting: Cardiovascular Disease

## 2013-05-15 ENCOUNTER — Encounter (HOSPITAL_COMMUNITY)
Admission: RE | Admit: 2013-05-15 | Discharge: 2013-05-15 | Disposition: A | Discharge status: Home / Self Care | Payer: Self-pay | Source: Ambulatory Visit | Attending: Cardiovascular Disease | Admitting: Cardiovascular Disease

## 2013-05-17 ENCOUNTER — Encounter (HOSPITAL_COMMUNITY)
Admission: RE | Admit: 2013-05-17 | Discharge: 2013-05-17 | Disposition: A | Discharge status: Home / Self Care | Payer: Self-pay | Source: Ambulatory Visit | Attending: Cardiovascular Disease | Admitting: Cardiovascular Disease

## 2013-05-22 ENCOUNTER — Encounter (HOSPITAL_COMMUNITY)
Admission: RE | Admit: 2013-05-22 | Discharge: 2013-05-22 | Disposition: A | Discharge status: Home / Self Care | Payer: Self-pay | Source: Ambulatory Visit | Attending: Cardiovascular Disease | Admitting: Cardiovascular Disease

## 2013-05-22 DIAGNOSIS — Z5189 Encounter for other specified aftercare: Secondary | ICD-10-CM | POA: Insufficient documentation

## 2013-05-22 DIAGNOSIS — Z8249 Family history of ischemic heart disease and other diseases of the circulatory system: Secondary | ICD-10-CM | POA: Insufficient documentation

## 2013-05-22 DIAGNOSIS — E785 Hyperlipidemia, unspecified: Secondary | ICD-10-CM | POA: Insufficient documentation

## 2013-05-22 DIAGNOSIS — I2510 Atherosclerotic heart disease of native coronary artery without angina pectoris: Secondary | ICD-10-CM | POA: Insufficient documentation

## 2013-05-22 DIAGNOSIS — Z9861 Coronary angioplasty status: Secondary | ICD-10-CM | POA: Insufficient documentation

## 2013-05-24 ENCOUNTER — Encounter (HOSPITAL_COMMUNITY)
Admission: RE | Admit: 2013-05-24 | Discharge: 2013-05-24 | Disposition: A | Discharge status: Home / Self Care | Payer: Self-pay | Source: Ambulatory Visit | Attending: Cardiovascular Disease | Admitting: Cardiovascular Disease

## 2013-05-29 ENCOUNTER — Encounter (HOSPITAL_COMMUNITY): Payer: Self-pay

## 2013-05-31 ENCOUNTER — Encounter (HOSPITAL_COMMUNITY): Payer: Self-pay

## 2013-06-05 ENCOUNTER — Encounter (HOSPITAL_COMMUNITY)
Admission: RE | Admit: 2013-06-05 | Discharge: 2013-06-05 | Disposition: A | Discharge status: Home / Self Care | Payer: Self-pay | Source: Ambulatory Visit | Attending: Cardiovascular Disease | Admitting: Cardiovascular Disease

## 2013-06-07 ENCOUNTER — Encounter (HOSPITAL_COMMUNITY)
Admission: RE | Admit: 2013-06-07 | Discharge: 2013-06-07 | Disposition: A | Discharge status: Home / Self Care | Payer: Self-pay | Source: Ambulatory Visit | Attending: Cardiovascular Disease | Admitting: Cardiovascular Disease

## 2013-06-12 ENCOUNTER — Encounter (HOSPITAL_COMMUNITY)
Admission: RE | Admit: 2013-06-12 | Discharge: 2013-06-12 | Disposition: A | Discharge status: Home / Self Care | Payer: Self-pay | Source: Ambulatory Visit | Attending: Cardiovascular Disease | Admitting: Cardiovascular Disease

## 2013-06-14 ENCOUNTER — Encounter (HOSPITAL_COMMUNITY)
Admission: RE | Admit: 2013-06-14 | Discharge: 2013-06-14 | Disposition: A | Discharge status: Home / Self Care | Payer: Self-pay | Source: Ambulatory Visit | Attending: Cardiovascular Disease | Admitting: Cardiovascular Disease

## 2013-06-19 ENCOUNTER — Encounter (HOSPITAL_COMMUNITY)
Admission: RE | Admit: 2013-06-19 | Discharge: 2013-06-19 | Disposition: A | Discharge status: Home / Self Care | Payer: Self-pay | Source: Ambulatory Visit | Attending: Cardiovascular Disease | Admitting: Cardiovascular Disease

## 2013-06-21 ENCOUNTER — Encounter (HOSPITAL_COMMUNITY)
Admission: RE | Admit: 2013-06-21 | Discharge: 2013-06-21 | Disposition: A | Discharge status: Home / Self Care | Payer: Self-pay | Source: Ambulatory Visit | Attending: Cardiovascular Disease | Admitting: Cardiovascular Disease

## 2013-06-26 ENCOUNTER — Encounter (HOSPITAL_COMMUNITY)
Admission: RE | Admit: 2013-06-26 | Discharge: 2013-06-26 | Disposition: A | Discharge status: Home / Self Care | Payer: Self-pay | Source: Ambulatory Visit | Attending: Cardiovascular Disease | Admitting: Cardiovascular Disease

## 2013-06-26 DIAGNOSIS — Z8249 Family history of ischemic heart disease and other diseases of the circulatory system: Secondary | ICD-10-CM | POA: Insufficient documentation

## 2013-06-26 DIAGNOSIS — E785 Hyperlipidemia, unspecified: Secondary | ICD-10-CM | POA: Insufficient documentation

## 2013-06-26 DIAGNOSIS — I2510 Atherosclerotic heart disease of native coronary artery without angina pectoris: Secondary | ICD-10-CM | POA: Insufficient documentation

## 2013-06-26 DIAGNOSIS — Z9861 Coronary angioplasty status: Secondary | ICD-10-CM | POA: Insufficient documentation

## 2013-06-26 DIAGNOSIS — Z5189 Encounter for other specified aftercare: Secondary | ICD-10-CM | POA: Insufficient documentation

## 2013-06-28 ENCOUNTER — Encounter (HOSPITAL_COMMUNITY)
Admission: RE | Admit: 2013-06-28 | Discharge: 2013-06-28 | Disposition: A | Discharge status: Home / Self Care | Payer: Self-pay | Source: Ambulatory Visit | Attending: Cardiovascular Disease | Admitting: Cardiovascular Disease

## 2013-07-03 ENCOUNTER — Encounter (HOSPITAL_COMMUNITY)
Admission: RE | Admit: 2013-07-03 | Discharge: 2013-07-03 | Disposition: A | Discharge status: Home / Self Care | Payer: Self-pay | Source: Ambulatory Visit | Attending: Cardiovascular Disease | Admitting: Cardiovascular Disease

## 2013-07-05 ENCOUNTER — Encounter (HOSPITAL_COMMUNITY)
Admission: RE | Admit: 2013-07-05 | Discharge: 2013-07-05 | Disposition: A | Discharge status: Home / Self Care | Payer: Self-pay | Source: Ambulatory Visit | Attending: Cardiovascular Disease | Admitting: Cardiovascular Disease

## 2013-07-10 ENCOUNTER — Encounter (HOSPITAL_COMMUNITY)
Admission: RE | Admit: 2013-07-10 | Discharge: 2013-07-10 | Disposition: A | Discharge status: Home / Self Care | Payer: Self-pay | Source: Ambulatory Visit | Attending: Cardiovascular Disease | Admitting: Cardiovascular Disease

## 2013-07-12 ENCOUNTER — Encounter (HOSPITAL_COMMUNITY)
Admission: RE | Admit: 2013-07-12 | Discharge: 2013-07-12 | Disposition: A | Discharge status: Home / Self Care | Payer: Self-pay | Source: Ambulatory Visit | Attending: Cardiovascular Disease | Admitting: Cardiovascular Disease

## 2013-07-17 ENCOUNTER — Encounter (HOSPITAL_COMMUNITY)
Admission: RE | Admit: 2013-07-17 | Discharge: 2013-07-17 | Disposition: A | Discharge status: Home / Self Care | Payer: Self-pay | Source: Ambulatory Visit | Attending: Cardiovascular Disease | Admitting: Cardiovascular Disease

## 2013-07-19 ENCOUNTER — Encounter (HOSPITAL_COMMUNITY)
Admission: RE | Admit: 2013-07-19 | Discharge: 2013-07-19 | Disposition: A | Discharge status: Home / Self Care | Payer: Self-pay | Source: Ambulatory Visit | Attending: Cardiovascular Disease | Admitting: Cardiovascular Disease

## 2013-07-24 ENCOUNTER — Encounter (HOSPITAL_COMMUNITY)
Admission: RE | Admit: 2013-07-24 | Discharge: 2013-07-24 | Disposition: A | Discharge status: Home / Self Care | Payer: Self-pay | Source: Ambulatory Visit | Attending: Cardiovascular Disease | Admitting: Cardiovascular Disease

## 2013-07-24 DIAGNOSIS — I2510 Atherosclerotic heart disease of native coronary artery without angina pectoris: Secondary | ICD-10-CM | POA: Insufficient documentation

## 2013-07-24 DIAGNOSIS — Z8249 Family history of ischemic heart disease and other diseases of the circulatory system: Secondary | ICD-10-CM | POA: Insufficient documentation

## 2013-07-24 DIAGNOSIS — E785 Hyperlipidemia, unspecified: Secondary | ICD-10-CM | POA: Insufficient documentation

## 2013-07-24 DIAGNOSIS — Z5189 Encounter for other specified aftercare: Secondary | ICD-10-CM | POA: Insufficient documentation

## 2013-07-24 DIAGNOSIS — Z9861 Coronary angioplasty status: Secondary | ICD-10-CM | POA: Insufficient documentation

## 2013-07-25 ENCOUNTER — Other Ambulatory Visit: Payer: Self-pay | Admitting: Cardiovascular Disease

## 2013-07-26 ENCOUNTER — Encounter (HOSPITAL_COMMUNITY)
Admission: RE | Admit: 2013-07-26 | Discharge: 2013-07-26 | Disposition: A | Discharge status: Home / Self Care | Payer: Self-pay | Source: Ambulatory Visit | Attending: Cardiovascular Disease | Admitting: Cardiovascular Disease

## 2013-07-31 ENCOUNTER — Encounter (HOSPITAL_COMMUNITY)
Admission: RE | Admit: 2013-07-31 | Discharge: 2013-07-31 | Disposition: A | Discharge status: Home / Self Care | Payer: Self-pay | Source: Ambulatory Visit | Attending: Cardiovascular Disease | Admitting: Cardiovascular Disease

## 2013-08-02 ENCOUNTER — Encounter (HOSPITAL_COMMUNITY)
Admission: RE | Admit: 2013-08-02 | Discharge: 2013-08-02 | Disposition: A | Discharge status: Home / Self Care | Payer: Self-pay | Source: Ambulatory Visit | Attending: Cardiovascular Disease | Admitting: Cardiovascular Disease

## 2013-08-07 ENCOUNTER — Encounter (HOSPITAL_COMMUNITY)
Admission: RE | Admit: 2013-08-07 | Discharge: 2013-08-07 | Disposition: A | Discharge status: Home / Self Care | Payer: Self-pay | Source: Ambulatory Visit | Attending: Cardiovascular Disease | Admitting: Cardiovascular Disease

## 2013-08-09 ENCOUNTER — Encounter (HOSPITAL_COMMUNITY)
Admission: RE | Admit: 2013-08-09 | Discharge: 2013-08-09 | Disposition: A | Discharge status: Home / Self Care | Payer: Self-pay | Source: Ambulatory Visit | Attending: Cardiovascular Disease | Admitting: Cardiovascular Disease

## 2013-08-14 ENCOUNTER — Encounter (HOSPITAL_COMMUNITY)
Admission: RE | Admit: 2013-08-14 | Discharge: 2013-08-14 | Disposition: A | Discharge status: Home / Self Care | Payer: Self-pay | Source: Ambulatory Visit | Attending: Cardiovascular Disease | Admitting: Cardiovascular Disease

## 2013-08-16 ENCOUNTER — Encounter (HOSPITAL_COMMUNITY)
Admission: RE | Admit: 2013-08-16 | Discharge: 2013-08-16 | Disposition: A | Discharge status: Home / Self Care | Payer: Self-pay | Source: Ambulatory Visit | Attending: Cardiovascular Disease | Admitting: Cardiovascular Disease

## 2013-08-21 ENCOUNTER — Encounter (HOSPITAL_COMMUNITY)
Admission: RE | Admit: 2013-08-21 | Discharge: 2013-08-21 | Disposition: A | Discharge status: Home / Self Care | Payer: Self-pay | Source: Ambulatory Visit | Attending: Cardiovascular Disease | Admitting: Cardiovascular Disease

## 2013-08-23 ENCOUNTER — Encounter (HOSPITAL_COMMUNITY)
Admission: RE | Admit: 2013-08-23 | Discharge: 2013-08-23 | Disposition: A | Discharge status: Home / Self Care | Payer: Self-pay | Source: Ambulatory Visit | Attending: Cardiovascular Disease | Admitting: Cardiovascular Disease

## 2013-08-23 DIAGNOSIS — I2510 Atherosclerotic heart disease of native coronary artery without angina pectoris: Secondary | ICD-10-CM | POA: Insufficient documentation

## 2013-08-23 DIAGNOSIS — E785 Hyperlipidemia, unspecified: Secondary | ICD-10-CM | POA: Insufficient documentation

## 2013-08-23 DIAGNOSIS — Z8249 Family history of ischemic heart disease and other diseases of the circulatory system: Secondary | ICD-10-CM | POA: Insufficient documentation

## 2013-08-23 DIAGNOSIS — Z9861 Coronary angioplasty status: Secondary | ICD-10-CM | POA: Insufficient documentation

## 2013-08-23 DIAGNOSIS — Z5189 Encounter for other specified aftercare: Secondary | ICD-10-CM | POA: Insufficient documentation

## 2013-08-28 ENCOUNTER — Encounter (HOSPITAL_COMMUNITY)
Admission: RE | Admit: 2013-08-28 | Discharge: 2013-08-28 | Disposition: A | Discharge status: Home / Self Care | Payer: Self-pay | Source: Ambulatory Visit | Attending: Cardiovascular Disease | Admitting: Cardiovascular Disease

## 2013-08-30 ENCOUNTER — Encounter (HOSPITAL_COMMUNITY)
Admission: RE | Admit: 2013-08-30 | Discharge: 2013-08-30 | Disposition: A | Discharge status: Home / Self Care | Payer: Self-pay | Source: Ambulatory Visit | Attending: Cardiovascular Disease | Admitting: Cardiovascular Disease

## 2013-09-04 ENCOUNTER — Encounter (HOSPITAL_COMMUNITY)
Admission: RE | Admit: 2013-09-04 | Discharge: 2013-09-04 | Disposition: A | Discharge status: Home / Self Care | Payer: Self-pay | Source: Ambulatory Visit | Attending: Cardiovascular Disease | Admitting: Cardiovascular Disease

## 2013-09-06 ENCOUNTER — Encounter (HOSPITAL_COMMUNITY)
Admission: RE | Admit: 2013-09-06 | Discharge: 2013-09-06 | Disposition: A | Discharge status: Home / Self Care | Payer: Self-pay | Source: Ambulatory Visit | Attending: Cardiovascular Disease | Admitting: Cardiovascular Disease

## 2013-09-11 ENCOUNTER — Encounter (HOSPITAL_COMMUNITY)
Admission: RE | Admit: 2013-09-11 | Discharge: 2013-09-11 | Disposition: A | Discharge status: Home / Self Care | Payer: Self-pay | Source: Ambulatory Visit | Attending: Cardiovascular Disease | Admitting: Cardiovascular Disease

## 2013-09-13 ENCOUNTER — Encounter (HOSPITAL_COMMUNITY)
Admission: RE | Admit: 2013-09-13 | Discharge: 2013-09-13 | Disposition: A | Discharge status: Home / Self Care | Payer: Self-pay | Source: Ambulatory Visit | Attending: Cardiovascular Disease | Admitting: Cardiovascular Disease

## 2013-09-18 ENCOUNTER — Encounter (HOSPITAL_COMMUNITY)
Admission: RE | Admit: 2013-09-18 | Discharge: 2013-09-18 | Disposition: A | Discharge status: Home / Self Care | Payer: Self-pay | Source: Ambulatory Visit | Attending: Cardiovascular Disease | Admitting: Cardiovascular Disease

## 2013-09-20 ENCOUNTER — Encounter (HOSPITAL_COMMUNITY)
Admission: RE | Admit: 2013-09-20 | Discharge: 2013-09-20 | Disposition: A | Discharge status: Home / Self Care | Payer: Self-pay | Source: Ambulatory Visit | Attending: Cardiovascular Disease | Admitting: Cardiovascular Disease

## 2013-09-25 ENCOUNTER — Encounter (HOSPITAL_COMMUNITY)
Admission: RE | Admit: 2013-09-25 | Discharge: 2013-09-25 | Disposition: A | Discharge status: Home / Self Care | Payer: Self-pay | Source: Ambulatory Visit | Attending: Cardiovascular Disease | Admitting: Cardiovascular Disease

## 2013-09-25 DIAGNOSIS — E785 Hyperlipidemia, unspecified: Secondary | ICD-10-CM | POA: Insufficient documentation

## 2013-09-25 DIAGNOSIS — I2510 Atherosclerotic heart disease of native coronary artery without angina pectoris: Secondary | ICD-10-CM | POA: Insufficient documentation

## 2013-09-25 DIAGNOSIS — Z9861 Coronary angioplasty status: Secondary | ICD-10-CM | POA: Insufficient documentation

## 2013-09-25 DIAGNOSIS — Z5189 Encounter for other specified aftercare: Secondary | ICD-10-CM | POA: Insufficient documentation

## 2013-09-25 DIAGNOSIS — Z8249 Family history of ischemic heart disease and other diseases of the circulatory system: Secondary | ICD-10-CM | POA: Insufficient documentation

## 2013-09-27 ENCOUNTER — Encounter (HOSPITAL_COMMUNITY)
Admission: RE | Admit: 2013-09-27 | Discharge: 2013-09-27 | Disposition: A | Discharge status: Home / Self Care | Payer: Self-pay | Source: Ambulatory Visit | Attending: Cardiovascular Disease | Admitting: Cardiovascular Disease

## 2013-10-02 ENCOUNTER — Encounter (HOSPITAL_COMMUNITY)
Admission: RE | Admit: 2013-10-02 | Discharge: 2013-10-02 | Disposition: A | Discharge status: Home / Self Care | Payer: Self-pay | Source: Ambulatory Visit | Attending: Cardiovascular Disease | Admitting: Cardiovascular Disease

## 2013-10-04 ENCOUNTER — Encounter (HOSPITAL_COMMUNITY)
Admission: RE | Admit: 2013-10-04 | Discharge: 2013-10-04 | Disposition: A | Discharge status: Home / Self Care | Payer: Self-pay | Source: Ambulatory Visit | Attending: Cardiovascular Disease | Admitting: Cardiovascular Disease

## 2013-10-09 ENCOUNTER — Encounter (HOSPITAL_COMMUNITY)
Admission: RE | Admit: 2013-10-09 | Discharge: 2013-10-09 | Disposition: A | Discharge status: Home / Self Care | Payer: Self-pay | Source: Ambulatory Visit | Attending: Cardiovascular Disease | Admitting: Cardiovascular Disease

## 2013-10-11 ENCOUNTER — Encounter (HOSPITAL_COMMUNITY): Payer: Medicare Other

## 2013-10-16 ENCOUNTER — Encounter (HOSPITAL_COMMUNITY)
Admission: RE | Admit: 2013-10-16 | Discharge: 2013-10-16 | Disposition: A | Discharge status: Home / Self Care | Payer: Self-pay | Source: Ambulatory Visit | Attending: Cardiovascular Disease | Admitting: Cardiovascular Disease

## 2013-10-23 ENCOUNTER — Encounter (HOSPITAL_COMMUNITY)
Admission: RE | Admit: 2013-10-23 | Discharge: 2013-10-23 | Disposition: A | Discharge status: Home / Self Care | Payer: Self-pay | Source: Ambulatory Visit | Attending: Cardiovascular Disease | Admitting: Cardiovascular Disease

## 2013-10-23 DIAGNOSIS — I2510 Atherosclerotic heart disease of native coronary artery without angina pectoris: Secondary | ICD-10-CM | POA: Insufficient documentation

## 2013-10-23 DIAGNOSIS — E785 Hyperlipidemia, unspecified: Secondary | ICD-10-CM | POA: Insufficient documentation

## 2013-10-23 DIAGNOSIS — Z5189 Encounter for other specified aftercare: Secondary | ICD-10-CM | POA: Insufficient documentation

## 2013-10-23 DIAGNOSIS — Z9861 Coronary angioplasty status: Secondary | ICD-10-CM | POA: Insufficient documentation

## 2013-10-23 DIAGNOSIS — Z8249 Family history of ischemic heart disease and other diseases of the circulatory system: Secondary | ICD-10-CM | POA: Insufficient documentation

## 2013-10-25 ENCOUNTER — Encounter (HOSPITAL_COMMUNITY)
Admission: RE | Admit: 2013-10-25 | Discharge: 2013-10-25 | Disposition: A | Discharge status: Home / Self Care | Payer: Self-pay | Source: Ambulatory Visit | Attending: Cardiovascular Disease | Admitting: Cardiovascular Disease

## 2013-10-30 ENCOUNTER — Encounter (HOSPITAL_COMMUNITY)
Admission: RE | Admit: 2013-10-30 | Discharge: 2013-10-30 | Disposition: A | Discharge status: Home / Self Care | Payer: Self-pay | Source: Ambulatory Visit | Attending: Cardiovascular Disease | Admitting: Cardiovascular Disease

## 2013-11-01 ENCOUNTER — Encounter (HOSPITAL_COMMUNITY)
Admission: RE | Admit: 2013-11-01 | Discharge: 2013-11-01 | Disposition: A | Discharge status: Home / Self Care | Payer: Self-pay | Source: Ambulatory Visit | Attending: Cardiovascular Disease | Admitting: Cardiovascular Disease

## 2013-11-06 ENCOUNTER — Encounter (HOSPITAL_COMMUNITY)
Admission: RE | Admit: 2013-11-06 | Discharge: 2013-11-06 | Disposition: A | Discharge status: Home / Self Care | Payer: Self-pay | Source: Ambulatory Visit | Attending: Cardiovascular Disease | Admitting: Cardiovascular Disease

## 2013-11-08 ENCOUNTER — Encounter (HOSPITAL_COMMUNITY)
Admission: RE | Admit: 2013-11-08 | Discharge: 2013-11-08 | Disposition: A | Discharge status: Home / Self Care | Payer: Self-pay | Source: Ambulatory Visit | Attending: Cardiovascular Disease | Admitting: Cardiovascular Disease

## 2013-11-13 ENCOUNTER — Encounter (HOSPITAL_COMMUNITY)
Admission: RE | Admit: 2013-11-13 | Discharge: 2013-11-13 | Disposition: A | Discharge status: Home / Self Care | Payer: Self-pay | Source: Ambulatory Visit | Attending: Cardiovascular Disease | Admitting: Cardiovascular Disease

## 2013-11-20 ENCOUNTER — Encounter (HOSPITAL_COMMUNITY)
Admission: RE | Admit: 2013-11-20 | Discharge: 2013-11-20 | Disposition: A | Discharge status: Home / Self Care | Payer: Self-pay | Source: Ambulatory Visit | Attending: Cardiovascular Disease | Admitting: Cardiovascular Disease

## 2013-11-26 ENCOUNTER — Encounter: Payer: Self-pay | Admitting: Cardiovascular Disease

## 2013-11-26 ENCOUNTER — Ambulatory Visit (INDEPENDENT_AMBULATORY_CARE_PROVIDER_SITE_OTHER): Payer: Medicare Other | Admitting: Cardiovascular Disease

## 2013-11-26 VITALS — BP 116/58 | HR 56 | Ht 65.0 in | Wt 104.1 lb

## 2013-11-26 DIAGNOSIS — I359 Nonrheumatic aortic valve disorder, unspecified: Secondary | ICD-10-CM

## 2013-11-26 DIAGNOSIS — I340 Nonrheumatic mitral (valve) insufficiency: Secondary | ICD-10-CM

## 2013-11-26 DIAGNOSIS — I10 Essential (primary) hypertension: Secondary | ICD-10-CM

## 2013-11-26 DIAGNOSIS — I2510 Atherosclerotic heart disease of native coronary artery without angina pectoris: Secondary | ICD-10-CM

## 2013-11-26 DIAGNOSIS — I059 Rheumatic mitral valve disease, unspecified: Secondary | ICD-10-CM

## 2013-11-26 DIAGNOSIS — E785 Hyperlipidemia, unspecified: Secondary | ICD-10-CM

## 2013-11-26 DIAGNOSIS — I351 Nonrheumatic aortic (valve) insufficiency: Secondary | ICD-10-CM
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
## 2013-11-26 NOTE — Patient Instructions (Signed)

## 2013-11-27 ENCOUNTER — Encounter (HOSPITAL_COMMUNITY)
Admission: RE | Admit: 2013-11-27 | Discharge: 2013-11-27 | Disposition: A | Discharge status: Home / Self Care | Payer: Self-pay | Source: Ambulatory Visit | Attending: Cardiovascular Disease | Admitting: Cardiovascular Disease

## 2013-11-27 DIAGNOSIS — E785 Hyperlipidemia, unspecified: Secondary | ICD-10-CM | POA: Insufficient documentation

## 2013-11-27 DIAGNOSIS — I2510 Atherosclerotic heart disease of native coronary artery without angina pectoris: Secondary | ICD-10-CM | POA: Insufficient documentation

## 2013-11-27 DIAGNOSIS — Z9861 Coronary angioplasty status: Secondary | ICD-10-CM | POA: Insufficient documentation

## 2013-11-27 DIAGNOSIS — Z5189 Encounter for other specified aftercare: Secondary | ICD-10-CM | POA: Insufficient documentation

## 2013-11-27 DIAGNOSIS — Z8249 Family history of ischemic heart disease and other diseases of the circulatory system: Secondary | ICD-10-CM | POA: Insufficient documentation

## 2013-11-29 ENCOUNTER — Other Ambulatory Visit: Payer: Self-pay | Admitting: Cardiovascular Disease

## 2013-11-29 ENCOUNTER — Encounter (HOSPITAL_COMMUNITY): Payer: Medicare Other

## 2013-12-04 ENCOUNTER — Encounter (HOSPITAL_COMMUNITY)
Admission: RE | Admit: 2013-12-04 | Discharge: 2013-12-04 | Disposition: A | Discharge status: Home / Self Care | Payer: Self-pay | Source: Ambulatory Visit | Attending: Cardiovascular Disease | Admitting: Cardiovascular Disease

## 2013-12-06 ENCOUNTER — Ambulatory Visit (HOSPITAL_COMMUNITY): Payer: Medicare Other | Attending: Cardiovascular Disease | Admitting: Radiology

## 2013-12-06 ENCOUNTER — Encounter (HOSPITAL_COMMUNITY)
Admission: RE | Admit: 2013-12-06 | Discharge: 2013-12-06 | Disposition: A | Discharge status: Home / Self Care | Payer: Self-pay | Source: Ambulatory Visit | Attending: Cardiovascular Disease | Admitting: Cardiovascular Disease

## 2013-12-06 DIAGNOSIS — I059 Rheumatic mitral valve disease, unspecified: Secondary | ICD-10-CM | POA: Insufficient documentation

## 2013-12-06 DIAGNOSIS — I340 Nonrheumatic mitral (valve) insufficiency: Secondary | ICD-10-CM

## 2013-12-06 DIAGNOSIS — I2510 Atherosclerotic heart disease of native coronary artery without angina pectoris: Secondary | ICD-10-CM | POA: Insufficient documentation

## 2013-12-06 DIAGNOSIS — J449 Chronic obstructive pulmonary disease, unspecified: Secondary | ICD-10-CM | POA: Insufficient documentation

## 2013-12-06 DIAGNOSIS — I10 Essential (primary) hypertension: Secondary | ICD-10-CM | POA: Insufficient documentation

## 2013-12-06 DIAGNOSIS — I359 Nonrheumatic aortic valve disorder, unspecified: Secondary | ICD-10-CM | POA: Insufficient documentation

## 2013-12-06 DIAGNOSIS — I351 Nonrheumatic aortic (valve) insufficiency: Secondary | ICD-10-CM

## 2013-12-06 DIAGNOSIS — J4489 Other specified chronic obstructive pulmonary disease: Secondary | ICD-10-CM | POA: Insufficient documentation

## 2013-12-06 DIAGNOSIS — E785 Hyperlipidemia, unspecified: Secondary | ICD-10-CM | POA: Insufficient documentation

## 2013-12-06 NOTE — Progress Notes (Signed)
Echocardiogram performed.  

## 2013-12-11 ENCOUNTER — Encounter (HOSPITAL_COMMUNITY): Payer: Medicare Other

## 2013-12-13 ENCOUNTER — Encounter (HOSPITAL_COMMUNITY)
Admission: RE | Admit: 2013-12-13 | Discharge: 2013-12-13 | Disposition: A | Discharge status: Home / Self Care | Payer: Self-pay | Source: Ambulatory Visit | Attending: Cardiovascular Disease | Admitting: Cardiovascular Disease

## 2013-12-18 ENCOUNTER — Encounter (HOSPITAL_COMMUNITY)
Admission: RE | Admit: 2013-12-18 | Discharge: 2013-12-18 | Disposition: A | Discharge status: Home / Self Care | Payer: Self-pay | Source: Ambulatory Visit | Attending: Cardiovascular Disease | Admitting: Cardiovascular Disease

## 2013-12-20 ENCOUNTER — Encounter (HOSPITAL_COMMUNITY)
Admission: RE | Admit: 2013-12-20 | Discharge: 2013-12-20 | Disposition: A | Discharge status: Home / Self Care | Payer: Self-pay | Source: Ambulatory Visit | Attending: Cardiovascular Disease | Admitting: Cardiovascular Disease

## 2013-12-25 ENCOUNTER — Encounter (HOSPITAL_COMMUNITY)
Admission: RE | Admit: 2013-12-25 | Discharge: 2013-12-25 | Disposition: A | Discharge status: Home / Self Care | Payer: Self-pay | Source: Ambulatory Visit | Attending: Cardiovascular Disease | Admitting: Cardiovascular Disease

## 2013-12-25 DIAGNOSIS — E785 Hyperlipidemia, unspecified: Secondary | ICD-10-CM | POA: Insufficient documentation

## 2013-12-25 DIAGNOSIS — Z5189 Encounter for other specified aftercare: Secondary | ICD-10-CM | POA: Insufficient documentation

## 2013-12-25 DIAGNOSIS — Z9861 Coronary angioplasty status: Secondary | ICD-10-CM | POA: Insufficient documentation

## 2013-12-25 DIAGNOSIS — I2510 Atherosclerotic heart disease of native coronary artery without angina pectoris: Secondary | ICD-10-CM | POA: Insufficient documentation

## 2013-12-25 DIAGNOSIS — Z8249 Family history of ischemic heart disease and other diseases of the circulatory system: Secondary | ICD-10-CM | POA: Insufficient documentation

## 2013-12-27 ENCOUNTER — Encounter (HOSPITAL_COMMUNITY)
Admission: RE | Admit: 2013-12-27 | Discharge: 2013-12-27 | Disposition: A | Discharge status: Home / Self Care | Payer: Self-pay | Source: Ambulatory Visit | Attending: Cardiovascular Disease | Admitting: Cardiovascular Disease

## 2014-01-01 ENCOUNTER — Encounter (HOSPITAL_COMMUNITY)
Admission: RE | Admit: 2014-01-01 | Discharge: 2014-01-01 | Disposition: A | Discharge status: Home / Self Care | Payer: Self-pay | Source: Ambulatory Visit | Attending: Cardiovascular Disease | Admitting: Cardiovascular Disease

## 2014-01-03 ENCOUNTER — Encounter (HOSPITAL_COMMUNITY)
Admission: RE | Admit: 2014-01-03 | Discharge: 2014-01-03 | Disposition: A | Discharge status: Home / Self Care | Payer: Self-pay | Source: Ambulatory Visit | Attending: Cardiovascular Disease | Admitting: Cardiovascular Disease

## 2014-01-08 ENCOUNTER — Encounter (HOSPITAL_COMMUNITY): Payer: Medicare Other

## 2014-01-10 ENCOUNTER — Encounter (HOSPITAL_COMMUNITY): Payer: Medicare Other

## 2014-01-15 ENCOUNTER — Encounter (HOSPITAL_COMMUNITY): Payer: Medicare Other

## 2014-01-17 ENCOUNTER — Encounter (HOSPITAL_COMMUNITY): Payer: Medicare Other

## 2014-01-22 ENCOUNTER — Encounter (HOSPITAL_COMMUNITY)
Admission: RE | Admit: 2014-01-22 | Discharge: 2014-01-22 | Disposition: A | Discharge status: Home / Self Care | Payer: Self-pay | Source: Ambulatory Visit | Attending: Cardiovascular Disease | Admitting: Cardiovascular Disease

## 2014-01-22 DIAGNOSIS — Z9861 Coronary angioplasty status: Secondary | ICD-10-CM | POA: Insufficient documentation

## 2014-01-22 DIAGNOSIS — Z8249 Family history of ischemic heart disease and other diseases of the circulatory system: Secondary | ICD-10-CM | POA: Insufficient documentation

## 2014-01-22 DIAGNOSIS — E785 Hyperlipidemia, unspecified: Secondary | ICD-10-CM | POA: Insufficient documentation

## 2014-01-22 DIAGNOSIS — Z5189 Encounter for other specified aftercare: Secondary | ICD-10-CM | POA: Insufficient documentation

## 2014-01-22 DIAGNOSIS — I2510 Atherosclerotic heart disease of native coronary artery without angina pectoris: Secondary | ICD-10-CM | POA: Insufficient documentation

## 2014-01-24 ENCOUNTER — Encounter (HOSPITAL_COMMUNITY)
Admission: RE | Admit: 2014-01-24 | Discharge: 2014-01-24 | Disposition: A | Discharge status: Home / Self Care | Payer: Self-pay | Source: Ambulatory Visit | Attending: Cardiovascular Disease | Admitting: Cardiovascular Disease

## 2014-01-29 ENCOUNTER — Encounter (HOSPITAL_COMMUNITY)
Admission: RE | Admit: 2014-01-29 | Discharge: 2014-01-29 | Disposition: A | Discharge status: Home / Self Care | Payer: Self-pay | Source: Ambulatory Visit | Attending: Cardiovascular Disease | Admitting: Cardiovascular Disease

## 2014-01-31 ENCOUNTER — Encounter (HOSPITAL_COMMUNITY)
Admission: RE | Admit: 2014-01-31 | Discharge: 2014-01-31 | Disposition: A | Discharge status: Home / Self Care | Payer: Self-pay | Source: Ambulatory Visit | Attending: Cardiovascular Disease | Admitting: Cardiovascular Disease

## 2014-02-05 ENCOUNTER — Encounter (HOSPITAL_COMMUNITY)
Admission: RE | Admit: 2014-02-05 | Discharge: 2014-02-05 | Disposition: A | Discharge status: Home / Self Care | Payer: Self-pay | Source: Ambulatory Visit | Attending: Cardiovascular Disease | Admitting: Cardiovascular Disease

## 2014-02-07 ENCOUNTER — Encounter (HOSPITAL_COMMUNITY)
Admission: RE | Admit: 2014-02-07 | Discharge: 2014-02-07 | Disposition: A | Discharge status: Home / Self Care | Payer: Self-pay | Source: Ambulatory Visit | Attending: Cardiovascular Disease | Admitting: Cardiovascular Disease

## 2014-02-12 ENCOUNTER — Encounter (HOSPITAL_COMMUNITY)
Admission: RE | Admit: 2014-02-12 | Discharge: 2014-02-12 | Disposition: A | Discharge status: Home / Self Care | Payer: Self-pay | Source: Ambulatory Visit | Attending: Cardiovascular Disease | Admitting: Cardiovascular Disease

## 2014-02-14 ENCOUNTER — Encounter (HOSPITAL_COMMUNITY)
Admission: RE | Admit: 2014-02-14 | Discharge: 2014-02-14 | Disposition: A | Discharge status: Home / Self Care | Payer: Self-pay | Source: Ambulatory Visit | Attending: Cardiovascular Disease | Admitting: Cardiovascular Disease

## 2014-02-19 ENCOUNTER — Encounter (HOSPITAL_COMMUNITY)
Admission: RE | Admit: 2014-02-19 | Discharge: 2014-02-19 | Disposition: A | Discharge status: Home / Self Care | Payer: Self-pay | Source: Ambulatory Visit | Attending: Cardiovascular Disease | Admitting: Cardiovascular Disease

## 2014-02-21 ENCOUNTER — Encounter (HOSPITAL_COMMUNITY)
Admission: RE | Admit: 2014-02-21 | Discharge: 2014-02-21 | Disposition: A | Discharge status: Home / Self Care | Payer: Self-pay | Source: Ambulatory Visit | Attending: Cardiovascular Disease | Admitting: Cardiovascular Disease

## 2014-02-21 DIAGNOSIS — Z9861 Coronary angioplasty status: Secondary | ICD-10-CM | POA: Insufficient documentation

## 2014-02-21 DIAGNOSIS — Z5189 Encounter for other specified aftercare: Secondary | ICD-10-CM | POA: Insufficient documentation

## 2014-02-21 DIAGNOSIS — E785 Hyperlipidemia, unspecified: Secondary | ICD-10-CM | POA: Insufficient documentation

## 2014-02-21 DIAGNOSIS — I2510 Atherosclerotic heart disease of native coronary artery without angina pectoris: Secondary | ICD-10-CM | POA: Insufficient documentation

## 2014-02-21 DIAGNOSIS — Z8249 Family history of ischemic heart disease and other diseases of the circulatory system: Secondary | ICD-10-CM | POA: Insufficient documentation

## 2014-02-26 ENCOUNTER — Encounter (HOSPITAL_COMMUNITY)
Admission: RE | Admit: 2014-02-26 | Discharge: 2014-02-26 | Disposition: A | Discharge status: Home / Self Care | Payer: Self-pay | Source: Ambulatory Visit | Attending: Cardiovascular Disease | Admitting: Cardiovascular Disease

## 2014-02-26 ENCOUNTER — Other Ambulatory Visit: Payer: Self-pay | Admitting: Cardiovascular Disease

## 2014-02-28 ENCOUNTER — Encounter (HOSPITAL_COMMUNITY)
Admission: RE | Admit: 2014-02-28 | Discharge: 2014-02-28 | Disposition: A | Discharge status: Home / Self Care | Payer: Self-pay | Source: Ambulatory Visit | Attending: Cardiovascular Disease | Admitting: Cardiovascular Disease

## 2014-03-05 ENCOUNTER — Encounter (HOSPITAL_COMMUNITY)
Admission: RE | Admit: 2014-03-05 | Discharge: 2014-03-05 | Disposition: A | Discharge status: Home / Self Care | Payer: Self-pay | Source: Ambulatory Visit | Attending: Cardiovascular Disease | Admitting: Cardiovascular Disease

## 2014-03-07 ENCOUNTER — Encounter (HOSPITAL_COMMUNITY)
Admission: RE | Admit: 2014-03-07 | Discharge: 2014-03-07 | Disposition: A | Discharge status: Home / Self Care | Payer: Self-pay | Source: Ambulatory Visit | Attending: Cardiovascular Disease | Admitting: Cardiovascular Disease

## 2014-03-12 ENCOUNTER — Encounter (HOSPITAL_COMMUNITY): Admission: RE | Admit: 2014-03-12 | Payer: Medicare Other | Source: Ambulatory Visit

## 2014-03-14 ENCOUNTER — Encounter (HOSPITAL_COMMUNITY): Payer: Medicare Other

## 2014-03-19 ENCOUNTER — Encounter (HOSPITAL_COMMUNITY)
Admission: RE | Admit: 2014-03-19 | Discharge: 2014-03-19 | Disposition: A | Discharge status: Home / Self Care | Payer: Self-pay | Source: Ambulatory Visit | Attending: Cardiovascular Disease | Admitting: Cardiovascular Disease

## 2014-03-21 ENCOUNTER — Encounter (HOSPITAL_COMMUNITY)
Admission: RE | Admit: 2014-03-21 | Discharge: 2014-03-21 | Disposition: A | Discharge status: Home / Self Care | Payer: Self-pay | Source: Ambulatory Visit | Attending: Cardiovascular Disease | Admitting: Cardiovascular Disease

## 2014-03-26 ENCOUNTER — Other Ambulatory Visit: Payer: Self-pay | Admitting: Cardiovascular Disease

## 2014-03-26 ENCOUNTER — Encounter (HOSPITAL_COMMUNITY)
Admission: RE | Admit: 2014-03-26 | Discharge: 2014-03-26 | Disposition: A | Discharge status: Home / Self Care | Payer: Self-pay | Source: Ambulatory Visit | Attending: Cardiovascular Disease | Admitting: Cardiovascular Disease

## 2014-03-26 DIAGNOSIS — Z5189 Encounter for other specified aftercare: Secondary | ICD-10-CM | POA: Insufficient documentation

## 2014-03-26 DIAGNOSIS — I359 Nonrheumatic aortic valve disorder, unspecified: Secondary | ICD-10-CM | POA: Insufficient documentation

## 2014-03-26 DIAGNOSIS — I2510 Atherosclerotic heart disease of native coronary artery without angina pectoris: Secondary | ICD-10-CM | POA: Insufficient documentation

## 2014-03-26 DIAGNOSIS — Z9861 Coronary angioplasty status: Secondary | ICD-10-CM | POA: Insufficient documentation

## 2014-03-28 ENCOUNTER — Encounter (HOSPITAL_COMMUNITY)
Admission: RE | Admit: 2014-03-28 | Discharge: 2014-03-28 | Disposition: A | Discharge status: Home / Self Care | Payer: Self-pay | Source: Ambulatory Visit | Attending: Cardiovascular Disease | Admitting: Cardiovascular Disease

## 2014-04-02 ENCOUNTER — Encounter (HOSPITAL_COMMUNITY): Payer: Self-pay

## 2014-04-03 ENCOUNTER — Emergency Department (HOSPITAL_COMMUNITY): Payer: Medicare Other

## 2014-04-03 ENCOUNTER — Encounter (HOSPITAL_COMMUNITY): Payer: Self-pay | Admitting: Emergency Medicine

## 2014-04-03 ENCOUNTER — Emergency Department (HOSPITAL_COMMUNITY)
Admission: EM | Admit: 2014-04-03 | Discharge: 2014-04-03 | Disposition: A | Discharge status: Home / Self Care | Payer: Medicare Other | Attending: Emergency Medicine | Admitting: Emergency Medicine

## 2014-04-03 DIAGNOSIS — X500XXA Overexertion from strenuous movement or load, initial encounter: Secondary | ICD-10-CM | POA: Insufficient documentation

## 2014-04-03 DIAGNOSIS — J4489 Other specified chronic obstructive pulmonary disease: Secondary | ICD-10-CM | POA: Insufficient documentation

## 2014-04-03 DIAGNOSIS — Y9289 Other specified places as the place of occurrence of the external cause: Secondary | ICD-10-CM | POA: Insufficient documentation

## 2014-04-03 DIAGNOSIS — E785 Hyperlipidemia, unspecified: Secondary | ICD-10-CM | POA: Insufficient documentation

## 2014-04-03 DIAGNOSIS — Z860100 Personal history of colon polyps, unspecified: Secondary | ICD-10-CM | POA: Insufficient documentation

## 2014-04-03 DIAGNOSIS — Z7902 Long term (current) use of antithrombotics/antiplatelets: Secondary | ICD-10-CM | POA: Insufficient documentation

## 2014-04-03 DIAGNOSIS — Z79899 Other long term (current) drug therapy: Secondary | ICD-10-CM | POA: Insufficient documentation

## 2014-04-03 DIAGNOSIS — Z8673 Personal history of transient ischemic attack (TIA), and cerebral infarction without residual deficits: Secondary | ICD-10-CM | POA: Insufficient documentation

## 2014-04-03 DIAGNOSIS — J449 Chronic obstructive pulmonary disease, unspecified: Secondary | ICD-10-CM | POA: Insufficient documentation

## 2014-04-03 DIAGNOSIS — IMO0002 Reserved for concepts with insufficient information to code with codable children: Secondary | ICD-10-CM | POA: Insufficient documentation

## 2014-04-03 DIAGNOSIS — S8992XA Unspecified injury of left lower leg, initial encounter: Secondary | ICD-10-CM

## 2014-04-03 DIAGNOSIS — S8990XA Unspecified injury of unspecified lower leg, initial encounter: Secondary | ICD-10-CM | POA: Insufficient documentation

## 2014-04-03 DIAGNOSIS — I10 Essential (primary) hypertension: Secondary | ICD-10-CM | POA: Insufficient documentation

## 2014-04-03 DIAGNOSIS — I2510 Atherosclerotic heart disease of native coronary artery without angina pectoris: Secondary | ICD-10-CM | POA: Insufficient documentation

## 2014-04-03 DIAGNOSIS — Y9389 Activity, other specified: Secondary | ICD-10-CM | POA: Insufficient documentation

## 2014-04-03 DIAGNOSIS — Z8719 Personal history of other diseases of the digestive system: Secondary | ICD-10-CM | POA: Insufficient documentation

## 2014-04-03 DIAGNOSIS — S99929A Unspecified injury of unspecified foot, initial encounter: Principal | ICD-10-CM

## 2014-04-03 DIAGNOSIS — S99919A Unspecified injury of unspecified ankle, initial encounter: Principal | ICD-10-CM

## 2014-04-03 DIAGNOSIS — Z9889 Other specified postprocedural states: Secondary | ICD-10-CM | POA: Insufficient documentation

## 2014-04-03 DIAGNOSIS — Z9861 Coronary angioplasty status: Secondary | ICD-10-CM | POA: Insufficient documentation

## 2014-04-03 LAB — GRAM STAIN

## 2014-04-03 MED ORDER — TRAMADOL HCL 50 MG PO TABS: 50.0000 mg | ORAL_TABLET | Freq: Four times a day (QID) | ORAL | Status: DC | PRN
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
## 2014-04-03 NOTE — Discharge Instructions (Signed)
Take Tramadol as needed for pain. Follow up with your Orthopedist for further evaluation. Do not bear weight on your leg until evaluation.

## 2014-04-03 NOTE — ED Notes (Signed)
Pt transported to xray 

## 2014-04-03 NOTE — ED Notes (Signed)
Pt presents to department for evaluation of L knee pain. States he heard L knee "pop" this morning. Denies recent injury. Swelling noted upon arrival to ED. Pt is alert and oriented x4.

## 2014-04-04 ENCOUNTER — Encounter (HOSPITAL_COMMUNITY): Payer: Self-pay
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
## 2014-04-06 LAB — BODY FLUID CULTURE: CULTURE: NO GROWTH

## 2014-04-09 ENCOUNTER — Encounter (HOSPITAL_COMMUNITY): Payer: Self-pay

## 2014-04-11 ENCOUNTER — Encounter (HOSPITAL_COMMUNITY): Payer: Self-pay

## 2014-04-16 ENCOUNTER — Encounter (HOSPITAL_COMMUNITY): Payer: Self-pay

## 2014-04-18 ENCOUNTER — Encounter (HOSPITAL_COMMUNITY): Payer: Self-pay

## 2014-04-23 ENCOUNTER — Encounter (HOSPITAL_COMMUNITY): Payer: Self-pay

## 2014-04-25 ENCOUNTER — Encounter (HOSPITAL_COMMUNITY): Payer: Self-pay

## 2014-04-30 ENCOUNTER — Encounter (HOSPITAL_COMMUNITY): Payer: Self-pay

## 2014-05-02 ENCOUNTER — Encounter (HOSPITAL_COMMUNITY): Payer: Self-pay

## 2014-05-07 ENCOUNTER — Encounter (HOSPITAL_COMMUNITY): Payer: Self-pay

## 2014-05-09 ENCOUNTER — Encounter (HOSPITAL_COMMUNITY): Payer: Self-pay

## 2014-05-14 ENCOUNTER — Encounter (HOSPITAL_COMMUNITY): Payer: Self-pay

## 2014-05-16 ENCOUNTER — Encounter (HOSPITAL_COMMUNITY): Payer: Self-pay

## 2014-05-21 ENCOUNTER — Encounter (HOSPITAL_COMMUNITY): Payer: Self-pay

## 2014-05-23 ENCOUNTER — Encounter (HOSPITAL_COMMUNITY): Payer: Self-pay

## 2014-05-28 ENCOUNTER — Encounter (HOSPITAL_COMMUNITY): Payer: Self-pay

## 2014-05-30 ENCOUNTER — Encounter (HOSPITAL_COMMUNITY): Payer: Self-pay

## 2014-06-04 ENCOUNTER — Encounter (HOSPITAL_COMMUNITY): Payer: Self-pay

## 2014-06-06 ENCOUNTER — Encounter (HOSPITAL_COMMUNITY): Payer: Self-pay

## 2014-06-11 ENCOUNTER — Encounter (HOSPITAL_COMMUNITY): Payer: Self-pay

## 2014-06-13 ENCOUNTER — Encounter (HOSPITAL_COMMUNITY): Payer: Self-pay

## 2014-06-18 ENCOUNTER — Encounter (HOSPITAL_COMMUNITY): Payer: Self-pay

## 2014-06-20 ENCOUNTER — Encounter (HOSPITAL_COMMUNITY): Payer: Self-pay

## 2014-06-25 ENCOUNTER — Encounter (HOSPITAL_COMMUNITY): Payer: Self-pay

## 2014-06-27 ENCOUNTER — Encounter (HOSPITAL_COMMUNITY): Payer: Self-pay

## 2014-07-02 ENCOUNTER — Encounter (HOSPITAL_COMMUNITY): Payer: Self-pay

## 2014-07-04 ENCOUNTER — Encounter (HOSPITAL_COMMUNITY): Payer: Self-pay

## 2014-07-09 ENCOUNTER — Encounter (HOSPITAL_COMMUNITY): Payer: Self-pay

## 2014-07-11 ENCOUNTER — Encounter (HOSPITAL_COMMUNITY): Payer: Self-pay

## 2014-07-16 ENCOUNTER — Encounter (HOSPITAL_COMMUNITY): Payer: Self-pay

## 2014-07-18 ENCOUNTER — Encounter (HOSPITAL_COMMUNITY): Payer: Self-pay

## 2014-07-23 ENCOUNTER — Encounter (HOSPITAL_COMMUNITY): Payer: Self-pay

## 2014-07-25 ENCOUNTER — Encounter (HOSPITAL_COMMUNITY): Payer: Self-pay

## 2014-07-30 ENCOUNTER — Other Ambulatory Visit: Payer: Self-pay | Admitting: Cardiovascular Disease

## 2014-07-30 ENCOUNTER — Encounter (HOSPITAL_COMMUNITY): Payer: Self-pay

## 2014-08-01 ENCOUNTER — Encounter (HOSPITAL_COMMUNITY): Payer: Self-pay

## 2014-08-06 ENCOUNTER — Encounter (HOSPITAL_COMMUNITY): Payer: Self-pay

## 2014-08-08 ENCOUNTER — Encounter (HOSPITAL_COMMUNITY): Payer: Self-pay

## 2014-08-13 ENCOUNTER — Encounter (HOSPITAL_COMMUNITY): Payer: Self-pay

## 2014-08-15 ENCOUNTER — Encounter (HOSPITAL_COMMUNITY): Payer: Self-pay

## 2014-08-20 ENCOUNTER — Encounter (HOSPITAL_COMMUNITY): Payer: Self-pay

## 2014-08-22 ENCOUNTER — Encounter (HOSPITAL_COMMUNITY): Payer: Self-pay

## 2014-08-24 ENCOUNTER — Other Ambulatory Visit: Payer: Self-pay | Admitting: Cardiovascular Disease

## 2014-08-27 ENCOUNTER — Encounter (HOSPITAL_COMMUNITY): Payer: Self-pay

## 2014-08-29 ENCOUNTER — Encounter (HOSPITAL_COMMUNITY): Payer: Self-pay

## 2014-09-03 ENCOUNTER — Encounter (HOSPITAL_COMMUNITY): Payer: Self-pay

## 2014-09-05 ENCOUNTER — Encounter (HOSPITAL_COMMUNITY): Payer: Self-pay

## 2014-09-10 ENCOUNTER — Encounter (HOSPITAL_COMMUNITY): Payer: Self-pay

## 2014-09-12 ENCOUNTER — Encounter (HOSPITAL_COMMUNITY): Payer: Self-pay

## 2014-09-17 ENCOUNTER — Encounter (HOSPITAL_COMMUNITY): Payer: Self-pay

## 2014-09-17 ENCOUNTER — Other Ambulatory Visit: Payer: Self-pay | Admitting: Cardiovascular Disease

## 2014-09-19 ENCOUNTER — Encounter (HOSPITAL_COMMUNITY): Payer: Self-pay

## 2014-09-20 ENCOUNTER — Other Ambulatory Visit: Payer: Self-pay | Admitting: Cardiovascular Disease

## 2014-09-21 ENCOUNTER — Other Ambulatory Visit: Payer: Self-pay

## 2014-09-21 MED ORDER — NITROGLYCERIN 0.4 MG SL SUBL: SUBLINGUAL_TABLET | SUBLINGUAL | Status: DC

## 2014-10-10 ENCOUNTER — Ambulatory Visit (INDEPENDENT_AMBULATORY_CARE_PROVIDER_SITE_OTHER): Payer: Medicare Other | Admitting: Podiatry

## 2014-10-10 ENCOUNTER — Encounter: Payer: Self-pay | Admitting: Podiatry

## 2014-10-10 VITALS — BP 125/52 | HR 52 | Resp 11 | Ht 61.0 in | Wt 105.0 lb

## 2014-10-10 DIAGNOSIS — M779 Enthesopathy, unspecified: Secondary | ICD-10-CM | POA: Diagnosis not present

## 2014-10-10 DIAGNOSIS — M898X9 Other specified disorders of bone, unspecified site: Secondary | ICD-10-CM

## 2014-10-10 DIAGNOSIS — M2041 Other hammer toe(s) (acquired), right foot: Secondary | ICD-10-CM

## 2014-10-10 DIAGNOSIS — L84 Corns and callosities: Secondary | ICD-10-CM | POA: Diagnosis not present

## 2014-10-10 DIAGNOSIS — I2510 Atherosclerotic heart disease of native coronary artery without angina pectoris: Secondary | ICD-10-CM

## 2014-10-10 MED ORDER — TRIAMCINOLONE ACETONIDE 10 MG/ML IJ SUSP
10.0000 mg | Freq: Once | INTRAMUSCULAR | Status: AC
  Administered 2014-10-10: 10 mg

## 2014-10-10 NOTE — Progress Notes (Signed)
Subjective:     Patient ID: Edward [name], male   DOB: May 21, 1930, 80 y.o.   MRN: [id number]  HPI patient states I get a lot of pain between the fourth and fifth toes on my right foot with lesions that have formed and also my nails are yellow and I wanted to have them looked at. States that he has trouble wearing shoe gear   Review of Systems  All other systems reviewed and are negative.      Objective:   Physical Exam  Constitutional: He is oriented to person, place, and time.  Cardiovascular: Intact distal pulses.   Musculoskeletal: Normal range of motion.  Neurological: He is oriented to person, place, and time.  Skin: Skin is warm and dry.  Nursing note and vitals reviewed.  neurovascular status mildly diminished but intact with range of motion subtalar midtarsal joint slightly diminished from normal and muscle strength adequate for his age. Patient's found to have well-perfused digits is well oriented 3 and does have thickness of his underlying nailbeds 1-5 of both feet. Between the fourth and fifth toes on the right foot there is a keratotic lesion with inflammation at the interphalangeal joint fourth toe right that's painful when pressed and there is abnormal toe     Assessment:     Abnormal positioning of the fourth and fifth toes right creating inflammatory condition and keratotic tissue formation and mycotic nail disease    Plan:     H&P and x-ray reviewed and careful interphalangeal joint injection fourth right to milligrams dexamethasone 2 mg Xylocaine 2 mg Kenalog accomplished. Debridement of lesions accomplished and padding and instructed to reappoint when this reoccurs and ultimately could require surgery that I educated him on today

## 2014-10-10 NOTE — Progress Notes (Signed)
   Subjective:    Patient ID: Edward [name], male    DOB: 31-Dec-1929, 80 y.o.   MRN: [id number]  HPI Comments: Pt states he periodically gets the hard places between his right 4 and 5th toes trimmed.     Review of Systems  All other systems reviewed and are negative.      Objective:   Physical Exam        Assessment & Plan:

## 2014-11-13 ENCOUNTER — Ambulatory Visit (INDEPENDENT_AMBULATORY_CARE_PROVIDER_SITE_OTHER): Payer: Medicare Other | Admitting: Podiatrist

## 2014-11-13 ENCOUNTER — Encounter: Payer: Self-pay | Admitting: Podiatrist

## 2014-11-13 VITALS — BP 128/62 | HR 78 | Resp 16

## 2014-11-13 DIAGNOSIS — L84 Corns and callosities: Secondary | ICD-10-CM
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
## 2014-11-28 ENCOUNTER — Ambulatory Visit (INDEPENDENT_AMBULATORY_CARE_PROVIDER_SITE_OTHER): Payer: Medicare Other | Admitting: Cardiovascular Disease

## 2014-11-28 ENCOUNTER — Encounter: Payer: Self-pay | Admitting: Cardiovascular Disease

## 2014-11-28 VITALS — BP 132/54 | HR 47 | Ht 61.0 in | Wt 103.1 lb

## 2014-11-28 DIAGNOSIS — E785 Hyperlipidemia, unspecified: Secondary | ICD-10-CM

## 2014-11-28 DIAGNOSIS — I351 Nonrheumatic aortic (valve) insufficiency: Secondary | ICD-10-CM

## 2014-11-28 DIAGNOSIS — I10 Essential (primary) hypertension: Secondary | ICD-10-CM

## 2014-11-28 DIAGNOSIS — I2510 Atherosclerotic heart disease of native coronary artery without angina pectoris: Secondary | ICD-10-CM

## 2014-11-28 DIAGNOSIS — I340 Nonrheumatic mitral (valve) insufficiency: Secondary | ICD-10-CM

## 2014-11-28 NOTE — Patient Instructions (Signed)
Your physician wants you to follow-up in:  12 months.  You will receive a reminder letter in the mail two months in advance. If you don't receive a letter, please call our office to schedule the follow-up appointment.   
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
## 2014-12-04 ENCOUNTER — Other Ambulatory Visit: Payer: Self-pay | Admitting: Cardiovascular Disease

## 2014-12-05 ENCOUNTER — Other Ambulatory Visit: Payer: Self-pay | Admitting: Cardiovascular Disease

## 2014-12-31 ENCOUNTER — Other Ambulatory Visit: Payer: Self-pay | Admitting: Cardiovascular Disease

## 2015-01-17 ENCOUNTER — Encounter: Payer: Self-pay | Admitting: Podiatrist

## 2015-01-17 ENCOUNTER — Ambulatory Visit (INDEPENDENT_AMBULATORY_CARE_PROVIDER_SITE_OTHER): Payer: Medicare Other | Admitting: Podiatrist

## 2015-01-17 VITALS — BP 124/64 | HR 86 | Resp 12

## 2015-01-17 DIAGNOSIS — M2041 Other hammer toe(s) (acquired), right foot: Secondary | ICD-10-CM

## 2015-01-17 DIAGNOSIS — L84 Corns and callosities: Secondary | ICD-10-CM | POA: Diagnosis not present
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
## 2015-01-17 NOTE — Patient Instructions (Signed)
Corns and Calluses Corns are small areas of thickened skin that usually occur on the top, sides, or tip of a toe. They contain a cone-shaped core with a point that can press on a nerve below. This causes pain. Calluses are areas of thickened skin that usually develop on hands, fingers, palms, soles of the feet, and heels. These are areas that experience frequent friction or pressure. CAUSES  Corns are usually the result of rubbing (friction) or pressure from shoes that are too tight or do not fit properly. Calluses are caused by repeated friction and pressure on the affected areas. SYMPTOMS  A hard growth on the skin.  Pain or tenderness under the skin.  Sometimes, redness and swelling.  Increased discomfort while wearing tight-fitting shoes. DIAGNOSIS  Your caregiver can usually tell what the problem is by doing a physical exam. TREATMENT  Removing the cause of the friction or pressure is usually the only treatment needed. However, sometimes medicines can be used to help soften the hardened, thickened areas. These medicines include salicylic acid plasters and 12% ammonium lactate lotion. These medicines should only be used under the direction of your caregiver. HOME CARE INSTRUCTIONS   Try to remove pressure from the affected area.  You may wear donut-shaped corn pads to protect your skin.  You may use a pumice stone or nonmetallic nail file to gently reduce the thickness of a corn.  Wear properly fitted footwear.  If you have calluses on the hands, wear gloves during activities that cause friction.  If you have diabetes, you should regularly examine your feet. Tell your caregiver if you notice any problems with your feet. SEEK IMMEDIATE MEDICAL CARE IF:   You have increased pain, swelling, redness, or warmth in the affected area.  Your corn or callus starts to drain fluid or bleeds.  You are not getting better, even with treatment. Document Released: 08/14/2004 Document  Revised: 01/31/2012 Document Reviewed: 07/06/2011 [employer] Patient Information 2015 [employer]. This information is not intended to replace advice given to you by your health care provider. Make sure you discuss any questions you have with your health care provider.  

## 2015-03-17 ENCOUNTER — Emergency Department (HOSPITAL_COMMUNITY): Payer: No Typology Code available for payment source

## 2015-03-17 ENCOUNTER — Inpatient Hospital Stay (HOSPITAL_COMMUNITY)
Admission: EM | Admit: 2015-03-17 | Discharge: 2015-03-19 | DRG: 206 | Disposition: A | Discharge status: Home / Self Care | Payer: No Typology Code available for payment source | Attending: General Surgery | Admitting: General Surgery

## 2015-03-17 ENCOUNTER — Encounter (HOSPITAL_COMMUNITY): Payer: Self-pay | Admitting: Emergency Medicine

## 2015-03-17 DIAGNOSIS — I10 Essential (primary) hypertension: Secondary | ICD-10-CM | POA: Diagnosis present

## 2015-03-17 DIAGNOSIS — J449 Chronic obstructive pulmonary disease, unspecified: Secondary | ICD-10-CM | POA: Diagnosis present

## 2015-03-17 DIAGNOSIS — R079 Chest pain, unspecified: Secondary | ICD-10-CM | POA: Diagnosis not present

## 2015-03-17 DIAGNOSIS — Z8673 Personal history of transient ischemic attack (TIA), and cerebral infarction without residual deficits: Secondary | ICD-10-CM

## 2015-03-17 DIAGNOSIS — S2231XA Fracture of one rib, right side, initial encounter for closed fracture: Secondary | ICD-10-CM | POA: Diagnosis present

## 2015-03-17 DIAGNOSIS — D62 Acute posthemorrhagic anemia: Secondary | ICD-10-CM | POA: Diagnosis present

## 2015-03-17 DIAGNOSIS — I252 Old myocardial infarction: Secondary | ICD-10-CM | POA: Diagnosis not present

## 2015-03-17 DIAGNOSIS — I2510 Atherosclerotic heart disease of native coronary artery without angina pectoris: Secondary | ICD-10-CM | POA: Diagnosis present

## 2015-03-17 DIAGNOSIS — E785 Hyperlipidemia, unspecified: Secondary | ICD-10-CM | POA: Diagnosis present

## 2015-03-17 DIAGNOSIS — I350 Nonrheumatic aortic (valve) stenosis: Secondary | ICD-10-CM | POA: Diagnosis present

## 2015-03-17 DIAGNOSIS — Y92410 Unspecified street and highway as the place of occurrence of the external cause: Secondary | ICD-10-CM | POA: Diagnosis not present

## 2015-03-17 DIAGNOSIS — S36892A Contusion of other intra-abdominal organs, initial encounter: Secondary | ICD-10-CM | POA: Diagnosis present

## 2015-03-17 DIAGNOSIS — Z955 Presence of coronary angioplasty implant and graft: Secondary | ICD-10-CM | POA: Diagnosis not present

## 2015-03-17 DIAGNOSIS — S2241XA Multiple fractures of ribs, right side, initial encounter for closed fracture: Secondary | ICD-10-CM

## 2015-03-17 LAB — CBC WITH DIFFERENTIAL/PLATELET
Basophils Absolute: 0 10*3/uL (ref 0.0–0.1)
Basophils Relative: 0 % (ref 0–1)
Eosinophils Absolute: 0.1 10*3/uL (ref 0.0–0.7)
Eosinophils Relative: 1 % (ref 0–5)
HCT: 33.7 % — ABNORMAL LOW (ref 39.0–52.0)
Hemoglobin: 11 g/dL — ABNORMAL LOW (ref 13.0–17.0)
LYMPHS ABS: 1.5 10*3/uL (ref 0.7–4.0)
Lymphocytes Relative: 14 % (ref 12–46)
MCH: 29.5 pg (ref 26.0–34.0)
MCHC: 32.6 g/dL (ref 30.0–36.0)
MCV: 90.3 fL (ref 78.0–100.0)
MONO ABS: 0.6 10*3/uL (ref 0.1–1.0)
Monocytes Relative: 6 % (ref 3–12)
NEUTROS PCT: 79 % — AB (ref 43–77)
Neutro Abs: 8.4 10*3/uL — ABNORMAL HIGH (ref 1.7–7.7)
Platelets: 174 10*3/uL (ref 150–400)
RBC: 3.73 MIL/uL — AB (ref 4.22–5.81)
RDW: 13.4 % (ref 11.5–15.5)
WBC: 10.6 10*3/uL — ABNORMAL HIGH (ref 4.0–10.5)

## 2015-03-17 LAB — I-STAT CREATININE, ED: Creatinine, Ser: 0.9 mg/dL (ref 0.50–1.35)

## 2015-03-17 LAB — I-STAT CHEM 8, ED
BUN: 11 mg/dL (ref 6–23)
Calcium, Ion: 1.11 mmol/L — ABNORMAL LOW (ref 1.13–1.30)
Chloride: 101 mmol/L (ref 96–112)
Creatinine, Ser: 0.8 mg/dL (ref 0.50–1.35)
Glucose, Bld: 92 mg/dL (ref 70–99)
HCT: 35 % — ABNORMAL LOW (ref 39.0–52.0)
HEMOGLOBIN: 11.9 g/dL — AB (ref 13.0–17.0)
Potassium: 3.6 mmol/L (ref 3.5–5.1)
SODIUM: 140 mmol/L (ref 135–145)
TCO2: 21 mmol/L (ref 0–100)

## 2015-03-17 MED ORDER — MOMETASONE FURO-FORMOTEROL FUM 100-5 MCG/ACT IN AERO
2.0000 | INHALATION_SPRAY | Freq: Two times a day (BID) | RESPIRATORY_TRACT | Status: DC
  Administered 2015-03-18 – 2015-03-19 (×3): 2 via RESPIRATORY_TRACT
  Filled 2015-03-17: qty 8.8

## 2015-03-17 MED ORDER — FLUTICASONE PROPIONATE 50 MCG/ACT NA SUSP
2.0000 | Freq: Every day | NASAL | Status: DC
  Administered 2015-03-18: 2 via NASAL
  Filled 2015-03-17: qty 16

## 2015-03-17 MED ORDER — MORPHINE SULFATE 4 MG/ML IJ SOLN
4.0000 mg | Freq: Once | INTRAMUSCULAR | Status: AC
  Administered 2015-03-17: 4 mg via INTRAVENOUS
  Filled 2015-03-17: qty 1

## 2015-03-17 MED ORDER — TRAMADOL HCL 50 MG PO TABS
50.0000 mg | ORAL_TABLET | Freq: Four times a day (QID) | ORAL | Status: DC | PRN
  Administered 2015-03-18: 50 mg via ORAL
  Filled 2015-03-17: qty 1

## 2015-03-17 MED ORDER — METOPROLOL TARTRATE 25 MG PO TABS
25.0000 mg | ORAL_TABLET | Freq: Two times a day (BID) | ORAL | Status: DC
  Administered 2015-03-18: 25 mg via ORAL
  Filled 2015-03-17 (×2): qty 1

## 2015-03-17 MED ORDER — IOHEXOL 300 MG/ML  SOLN
100.0000 mL | Freq: Once | INTRAMUSCULAR | Status: AC | PRN
  Administered 2015-03-17: 100 mL via INTRAVENOUS
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
## 2015-03-18 ENCOUNTER — Encounter (HOSPITAL_COMMUNITY): Payer: Self-pay | Admitting: *Deleted

## 2015-03-18 LAB — CBC
HCT: 33.1 % — ABNORMAL LOW (ref 39.0–52.0)
Hemoglobin: 10.5 g/dL — ABNORMAL LOW (ref 13.0–17.0)
MCH: 28.9 pg (ref 26.0–34.0)
MCHC: 31.7 g/dL (ref 30.0–36.0)
MCV: 91.2 fL (ref 78.0–100.0)
PLATELETS: 168 10*3/uL (ref 150–400)
RBC: 3.63 MIL/uL — ABNORMAL LOW (ref 4.22–5.81)
RDW: 13.5 % (ref 11.5–15.5)
WBC: 7.9 10*3/uL (ref 4.0–10.5)

## 2015-03-18 LAB — BASIC METABOLIC PANEL
ANION GAP: 7 (ref 5–15)
BUN: 10 mg/dL (ref 6–23)
CHLORIDE: 107 mmol/L (ref 96–112)
CO2: 26 mmol/L (ref 19–32)
CREATININE: 0.86 mg/dL (ref 0.50–1.35)
Calcium: 8.6 mg/dL (ref 8.4–10.5)
GFR calc Af Amer: >90 mL/min (ref >90)
GFR calc non Af Amer: 79 mL/min — ABNORMAL LOW (ref >90)
GLUCOSE: 103 mg/dL — AB (ref 70–99)
Potassium: 4.2 mmol/L (ref 3.5–5.1)
Sodium: 140 mmol/L (ref 135–145)

## 2015-03-18 MED ORDER — FLUTICASONE PROPIONATE 50 MCG/ACT NA SUSP
1.0000 | Freq: Every day | NASAL | Status: DC
  Administered 2015-03-19: 1 via NASAL
  Filled 2015-03-18: qty 16

## 2015-03-18 MED ORDER — BISACODYL 10 MG RE SUPP: 10.0000 mg | Freq: Every day | RECTAL | Status: DC | PRN

## 2015-03-18 MED ORDER — NITROGLYCERIN 0.4 MG SL SUBL: 0.4000 mg | SUBLINGUAL_TABLET | SUBLINGUAL | Status: DC | PRN

## 2015-03-18 MED ORDER — HYDROCODONE-ACETAMINOPHEN 5-325 MG PO TABS: 1.0000 | ORAL_TABLET | ORAL | Status: DC | PRN

## 2015-03-18 MED ORDER — HYDROCODONE-ACETAMINOPHEN 5-325 MG PO TABS: 2.0000 | ORAL_TABLET | ORAL | Status: DC | PRN

## 2015-03-18 MED ORDER — MORPHINE SULFATE 2 MG/ML IJ SOLN: 2.0000 mg | INTRAMUSCULAR | Status: DC | PRN

## 2015-03-18 MED ORDER — METOPROLOL TARTRATE 25 MG PO TABS
25.0000 mg | ORAL_TABLET | Freq: Two times a day (BID) | ORAL | Status: DC
  Administered 2015-03-18 – 2015-03-19 (×2): 25 mg via ORAL
  Filled 2015-03-18 (×2): qty 1

## 2015-03-18 MED ORDER — RAMIPRIL 2.5 MG PO CAPS
2.5000 mg | ORAL_CAPSULE | Freq: Every day | ORAL | Status: DC
  Administered 2015-03-18 – 2015-03-19 (×2): 2.5 mg via ORAL
  Filled 2015-03-18 (×2): qty 1

## 2015-03-18 MED ORDER — TRAMADOL HCL 50 MG PO TABS
50.0000 mg | ORAL_TABLET | Freq: Four times a day (QID) | ORAL | Status: DC | PRN
Start: 2015-03-18 — End: 2015-03-19
  Administered 2015-03-18 – 2015-03-19 (×3): 50 mg via ORAL
  Filled 2015-03-18 (×3): qty 1

## 2015-03-18 MED ORDER — MONTELUKAST SODIUM 10 MG PO TABS
10.0000 mg | ORAL_TABLET | Freq: Every day | ORAL | Status: DC
  Administered 2015-03-18: 10 mg via ORAL
  Filled 2015-03-18: qty 1

## 2015-03-18 MED ORDER — ATORVASTATIN CALCIUM 40 MG PO TABS
40.0000 mg | ORAL_TABLET | Freq: Every day | ORAL | Status: DC
  Administered 2015-03-18: 40 mg via ORAL
  Filled 2015-03-18: qty 1

## 2015-03-18 MED ORDER — ACETAMINOPHEN 325 MG PO TABS: 650.0000 mg | ORAL_TABLET | ORAL | Status: DC | PRN

## 2015-03-18 MED ORDER — DOCUSATE SODIUM 100 MG PO CAPS
100.0000 mg | ORAL_CAPSULE | Freq: Two times a day (BID) | ORAL | Status: DC
  Administered 2015-03-18 – 2015-03-19 (×3): 100 mg via ORAL
  Filled 2015-03-18 (×3): qty 1

## 2015-03-18 MED ORDER — POLYETHYLENE GLYCOL 3350 17 G PO PACK
17.0000 g | PACK | Freq: Every day | ORAL | Status: DC
  Administered 2015-03-18 – 2015-03-19 (×2): 17 g via ORAL
  Filled 2015-03-18 (×2): qty 1

## 2015-03-18 MED ORDER — ONDANSETRON HCL 4 MG/2ML IJ SOLN
4.0000 mg | Freq: Four times a day (QID) | INTRAMUSCULAR | Status: DC | PRN
Start: 2015-03-18 — End: 2015-03-19

## 2015-03-18 MED ORDER — SODIUM CHLORIDE 0.9 % IV SOLN
INTRAVENOUS | Status: DC
  Administered 2015-03-18: 02:00:00 via INTRAVENOUS

## 2015-03-18 MED ORDER — ONDANSETRON HCL 4 MG PO TABS: 4.0000 mg | ORAL_TABLET | Freq: Four times a day (QID) | ORAL | Status: DC | PRN

## 2015-03-18 NOTE — Progress Notes (Signed)
UR completed.  Zula Hovsepian, RN BSN M[facility] CCM Trauma/Neuro ICU Case Manager [phone]  

## 2015-03-18 NOTE — Progress Notes (Signed)
No new orders at this time. Edward [name]

## 2015-03-18 NOTE — Progress Notes (Signed)
SBIRT assessment completed. Patient denies alcohol use. No further intervention needed.
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
## 2015-03-18 NOTE — Progress Notes (Signed)
CSW Assessment completed by CSW Intern Oretha Ellis has been reviewed and I agree with this assessment.

## 2015-03-18 NOTE — Clinical Social Work Note (Cosign Needed)
Clinical Social Work Assessment  Patient Details  Name: Edward [name] MRN: [id number] Date of Birth: 1930-07-31  Date of referral:  03/18/15               Reason for consult:  Rule-out Psychosocial                Permission sought to share information with:    Permission granted to share information::     Name::        Agency::     Relationship::     Contact Information:     Housing/Transportation Living arrangements for the past 2 months:  Single Family Home Source of Information:  Patient Patient Interpreter Needed:  None Criminal Activity/Legal Involvement Pertinent to Current Situation/Hospitalization:  No - Comment as needed Significant Relationships:  Spouse Lives with:  Spouse Gay Filler [place]) Do you feel safe going back to the place where you live?  Yes Need for family participation in patient care:  Yes (Comment)  Care giving concerns:  Patient discharging with 24 hours supervision at home.    Social Worker assessment / plan:  CSW Intern discussed plan for discharge with patient. Patient lives with his wife, Edward [name], and she will provide support in the home. Patient appreciative of CSW Intern support and engaged during assessment. CSW remains available for support and to facilitate patient discharge need once medically ready. Psychosocial support/ongoing assessment of needs.   Employment status:  Retired Forensic scientist:  [employer] PT Recommendations:  [place] / Referral to community resources:  SBIRT  Patient/Family's Response to care:  Patient alert and oriented x3 in bed. No family or friends currently present at bedside. Patient engaged in [place] Intern assessment and agreeable with discharge plan. Patient understanding of Social Work role and appreciative of support.   Patient/Family's Understanding of and Emotional Response to Diagnosis, Current Treatment, and Prognosis:  Patient understanding of need for supervised care in the home.    Emotional Assessment Appearance:  Well-Groomed Attitude/Demeanor/Rapport:  Lethargic Affect (typically observed):  Appropriate Orientation:  Oriented to Self, Oriented to Place, Oriented to Situation Alcohol / Substance use:  Never Used Psych involvement (Current and /or in the community):  No (Comment)  Discharge Needs  Concerns to be addressed:  Discharge Planning Concerns Readmission within the last 30 days:  No Current discharge risk:  None Barriers to Discharge:  No Barriers Identified   Edward [name], Student-SW 03/18/2015, 8:43 PM
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
## 2015-03-19 LAB — CBC
HEMATOCRIT: 33.1 % — AB (ref 39.0–52.0)
Hemoglobin: 10.9 g/dL — ABNORMAL LOW (ref 13.0–17.0)
MCH: 29.7 pg (ref 26.0–34.0)
MCHC: 32.9 g/dL (ref 30.0–36.0)
MCV: 90.2 fL (ref 78.0–100.0)
Platelets: 166 10*3/uL (ref 150–400)
RBC: 3.67 MIL/uL — AB (ref 4.22–5.81)
RDW: 13.4 % (ref 11.5–15.5)
WBC: 10 10*3/uL (ref 4.0–10.5)

## 2015-03-19 MED ORDER — TRAMADOL HCL 50 MG PO TABS: 50.0000 mg | ORAL_TABLET | Freq: Four times a day (QID) | ORAL | Status: DC | PRN
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
## 2015-03-19 NOTE — Clinical Social Work Note (Signed)
Clinical Social Worker will sign off for now as social work intervention is no longer needed. Please consult us again if new need arises.  Jesse Susie Ehresman, LCSW [phone]  

## 2015-03-19 NOTE — Progress Notes (Signed)
Discharge instructions/ultram prescription given and explained to pt and pt's wife.  Both verbalize understanding of all orders.instructions and deny any questions at this time.  IV removed and site CDI.  Pt discharged to home with wife in no s/s of distress. Edward [name]
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
## 2015-03-19 NOTE — Discharge Instructions (Signed)
Increase activity as pain allows.  Do not restart Plavix until 5/4.

## 2015-03-25 ENCOUNTER — Ambulatory Visit (INDEPENDENT_AMBULATORY_CARE_PROVIDER_SITE_OTHER): Payer: Medicare Other

## 2015-03-25 ENCOUNTER — Ambulatory Visit (INDEPENDENT_AMBULATORY_CARE_PROVIDER_SITE_OTHER): Payer: Medicare Other | Admitting: Emergency Medicine

## 2015-03-25 VITALS — BP 124/56 | HR 60 | Temp 97.8°F | Resp 18 | Ht 60.5 in | Wt 97.6 lb

## 2015-03-25 DIAGNOSIS — R918 Other nonspecific abnormal finding of lung field: Secondary | ICD-10-CM

## 2015-03-25 DIAGNOSIS — S2241XA Multiple fractures of ribs, right side, initial encounter for closed fracture: Secondary | ICD-10-CM

## 2015-03-25 DIAGNOSIS — K661 Hemoperitoneum: Secondary | ICD-10-CM

## 2015-03-25 DIAGNOSIS — I2510 Atherosclerotic heart disease of native coronary artery without angina pectoris: Secondary | ICD-10-CM

## 2015-03-25 LAB — POCT CBC
GRANULOCYTE PERCENT: 74 % (ref 37–80)
HEMATOCRIT: 39.3 % — AB (ref 43.5–53.7)
Hemoglobin: 12.4 g/dL — AB (ref 14.1–18.1)
Lymph, poc: 1.7 (ref 0.6–3.4)
MCH, POC: 28.7 pg (ref 27–31.2)
MCHC: 31.6 g/dL — AB (ref 31.8–35.4)
MCV: 91.1 fL (ref 80–97)
MID (cbc): 0.4 (ref 0–0.9)
MPV: 7.7 fL (ref 0–99.8)
POC GRANULOCYTE: 6.2 (ref 2–6.9)
POC LYMPH %: 20.8 % (ref 10–50)
POC MID %: 5.2 % (ref 0–12)
Platelet Count, POC: 302 10*3/uL (ref 142–424)
RBC: 4.32 M/uL — AB (ref 4.69–6.13)
RDW, POC: 15.5 %
WBC: 8.4 10*3/uL (ref 4.6–10.2)
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
## 2015-03-25 NOTE — Patient Instructions (Signed)

## 2015-08-10 ENCOUNTER — Ambulatory Visit (INDEPENDENT_AMBULATORY_CARE_PROVIDER_SITE_OTHER): Payer: Medicare Other | Admitting: Physician Assistant

## 2015-08-10 ENCOUNTER — Other Ambulatory Visit: Payer: Self-pay | Admitting: Physician Assistant

## 2015-08-10 ENCOUNTER — Ambulatory Visit (INDEPENDENT_AMBULATORY_CARE_PROVIDER_SITE_OTHER): Payer: Medicare Other

## 2015-08-10 VITALS — BP 138/62 | HR 56 | Temp 98.2°F | Resp 18 | Ht 61.0 in | Wt 99.0 lb

## 2015-08-10 DIAGNOSIS — R059 Cough, unspecified: Secondary | ICD-10-CM

## 2015-08-10 DIAGNOSIS — R05 Cough: Secondary | ICD-10-CM

## 2015-08-10 DIAGNOSIS — R0981 Nasal congestion: Secondary | ICD-10-CM

## 2015-08-10 DIAGNOSIS — I2510 Atherosclerotic heart disease of native coronary artery without angina pectoris: Secondary | ICD-10-CM

## 2015-08-10 MED ORDER — IPRATROPIUM BROMIDE 0.03 % NA SOLN: 2.0000 | Freq: Two times a day (BID) | NASAL | Status: DC

## 2015-08-10 MED ORDER — DOXYCYCLINE HYCLATE 100 MG PO CAPS: 100.0000 mg | ORAL_CAPSULE | Freq: Two times a day (BID) | ORAL | Status: DC

## 2015-08-10 NOTE — Progress Notes (Signed)
   Subjective:    Patient ID: Edward [name], male    DOB: October 08, 1930, 81 y.o.   MRN: [id number]  Chief Complaint  Patient presents with  . Cough    x 3 days, gets worse at night   . Sore Throat   Medications, allergies, past medical history, surgical history, family history, social history and problem list reviewed and updated.  HPI  89 yom presents with 4 day h/o prod cough.   Cough started 4 days ago. Prod initially white sputum then yellow sputum. Throughout day. Feels head/nasal congestion. Scratchy throat. Denies fevers, chills, known sick contacts.   Hx cad. Denies cp. Denies LE edema, orthopnea, pnd. Denies exertional cp. Hx asthma per pt, has been on Advair in the past but has not used in one year. Never been a smoker. Denies reflux sx. Denies recent wt loss or night sweats.   Has been on ramipril for several yrs.   Review of Systems See HPI     Objective:   Physical Exam  Constitutional: He appears well-developed and well-nourished.  Non-toxic appearance. He does not have a sickly appearance. He does not appear ill. No distress.  BP 138/62 mmHg  Pulse 56  Temp(Src) 98.2 F (36.8 C) (Oral)  Resp 18  Ht 5\' 1"  (1.549 m)  Wt 99 lb (44.906 kg)  BMI 18.72 kg/m2  SpO2 98%   HENT:  Right Ear: Tympanic membrane normal.  Left Ear: Tympanic membrane normal.  Nose: Mucosal edema and rhinorrhea present. Right sinus exhibits no maxillary sinus tenderness and no frontal sinus tenderness. Left sinus exhibits no maxillary sinus tenderness and no frontal sinus tenderness.  Mouth/Throat: Uvula is midline, oropharynx is clear and moist and mucous membranes are normal.  Cardiovascular: Normal rate, regular rhythm and normal heart sounds.   Pulmonary/Chest: Effort normal and breath sounds normal.  Lymphadenopathy:       Head (right side): No submental, no submandibular and no tonsillar adenopathy present.       Head (left side): No submental, no submandibular and no tonsillar  adenopathy present.    He has no cervical adenopathy.   [facility] reading (PRIMARY) by  Dr. Everlene Farrier. Chest findings: Prominent ascending aorta. No acute process.      Assessment & Plan:   Cough - Plan: DG Chest 2 View, doxycycline (VIBRAMYCIN) 100 MG capsule  Nasal congestion - Plan: ipratropium (ATROVENT) 0.03 % nasal spray --vital signs, exam, cxr all normal and reassuring this is likely viral uri/early acute bronchitis --no edema, cxr normal, no sx to suggest chf --cough is prod and assoc with head congestion, doubt ace-i is cause at this time --doxy sent , to be filled if cough persists, worsens, sputum changes/worsens, fevers --f/u with pcp  [name], PA-C Physician Assistant-Certified [facility]  08/10/2015 10:23 AM

## 2015-08-10 NOTE — Patient Instructions (Signed)
Your lung sounds, vital signs, and chest xray were all normal and reassuring.  We've sent in doxycycline. If the cough persists for another 2-3 days or worsens or becomes more productive, please fill the medication and take it twice daily for 10 days.  You can use the atrovent twice daily in each nostril for the nasal congestion. Please be sure to follow up with your pcp.

## 2015-08-11 ENCOUNTER — Encounter: Payer: Self-pay | Admitting: Podiatry

## 2015-08-11 ENCOUNTER — Ambulatory Visit (INDEPENDENT_AMBULATORY_CARE_PROVIDER_SITE_OTHER): Payer: Medicare Other | Admitting: Podiatry

## 2015-08-11 VITALS — BP 140/73 | HR 60 | Resp 16

## 2015-08-11 DIAGNOSIS — L84 Corns and callosities: Secondary | ICD-10-CM

## 2015-08-11 DIAGNOSIS — M779 Enthesopathy, unspecified: Secondary | ICD-10-CM | POA: Diagnosis not present

## 2015-08-11 MED ORDER — TRIAMCINOLONE ACETONIDE 10 MG/ML IJ SUSP
10.0000 mg | Freq: Once | INTRAMUSCULAR | Status: AC
  Administered 2015-08-11: 10 mg

## 2015-08-11 NOTE — Progress Notes (Signed)
Subjective:     Patient ID: Edward [name], male   DOB: 12/30/1929, 81 y.o.   MRN: [id number]  HPI patient presents stating I have a lot of pain between the fourth and fifth toes on my right foot and it's making it hard for me to walk and wear shoe gear comfortably   Review of Systems     Objective:   Physical Exam Neurovascular status intact with inflammation and pain fourth toe right foot with deviation of the fifth toe pressing against it with fluid at the interphalangeal joint    Assessment:     Inflammatory capsulitis with keratotic lesion fourth toe right foot    Plan:     Careful injection of the interphalangeal joint of the fourth digit lateral side and debridement of lesion accomplished with no iatrogenic bleeding noted. Reappoint to recheck

## 2015-09-26 ENCOUNTER — Ambulatory Visit
Admission: RE | Admit: 2015-09-26 | Discharge: 2015-09-26 | Disposition: A | Discharge status: Home / Self Care | Payer: Medicare Other | Source: Ambulatory Visit | Attending: Emergency Medicine | Admitting: Emergency Medicine

## 2015-09-26 DIAGNOSIS — S2241XA Multiple fractures of ribs, right side, initial encounter for closed fracture: Secondary | ICD-10-CM

## 2015-09-26 DIAGNOSIS — K683 Retroperitoneal hematoma: Secondary | ICD-10-CM

## 2015-09-26 DIAGNOSIS — K661 Hemoperitoneum: Secondary | ICD-10-CM

## 2015-09-26 DIAGNOSIS — R918 Other nonspecific abnormal finding of lung field: Secondary | ICD-10-CM

## 2015-11-30 ENCOUNTER — Inpatient Hospital Stay (HOSPITAL_COMMUNITY)
Admission: EM | Admit: 2015-11-30 | Discharge: 2015-12-04 | DRG: 640 | Disposition: A | Discharge status: Home / Self Care | Payer: Medicare Other | Attending: Internal Medicine | Admitting: Internal Medicine

## 2015-11-30 ENCOUNTER — Emergency Department (HOSPITAL_COMMUNITY): Payer: Medicare Other

## 2015-11-30 ENCOUNTER — Encounter (HOSPITAL_COMMUNITY): Payer: Self-pay | Admitting: Emergency Medicine

## 2015-11-30 DIAGNOSIS — Z7902 Long term (current) use of antithrombotics/antiplatelets: Secondary | ICD-10-CM

## 2015-11-30 DIAGNOSIS — R001 Bradycardia, unspecified: Secondary | ICD-10-CM | POA: Diagnosis present

## 2015-11-30 DIAGNOSIS — I639 Cerebral infarction, unspecified: Secondary | ICD-10-CM

## 2015-11-30 DIAGNOSIS — E872 Acidosis: Secondary | ICD-10-CM | POA: Diagnosis not present

## 2015-11-30 DIAGNOSIS — Z7951 Long term (current) use of inhaled steroids: Secondary | ICD-10-CM

## 2015-11-30 DIAGNOSIS — I10 Essential (primary) hypertension: Secondary | ICD-10-CM | POA: Diagnosis present

## 2015-11-30 DIAGNOSIS — I2510 Atherosclerotic heart disease of native coronary artery without angina pectoris: Secondary | ICD-10-CM | POA: Diagnosis present

## 2015-11-30 DIAGNOSIS — R52 Pain, unspecified: Secondary | ICD-10-CM

## 2015-11-30 DIAGNOSIS — Z681 Body mass index (BMI) 19 or less, adult: Secondary | ICD-10-CM | POA: Diagnosis not present

## 2015-11-30 DIAGNOSIS — D696 Thrombocytopenia, unspecified: Secondary | ICD-10-CM | POA: Diagnosis present

## 2015-11-30 DIAGNOSIS — Z955 Presence of coronary angioplasty implant and graft: Secondary | ICD-10-CM | POA: Diagnosis not present

## 2015-11-30 DIAGNOSIS — R1310 Dysphagia, unspecified: Secondary | ICD-10-CM | POA: Diagnosis present

## 2015-11-30 DIAGNOSIS — D72829 Elevated white blood cell count, unspecified: Secondary | ICD-10-CM

## 2015-11-30 DIAGNOSIS — R627 Adult failure to thrive: Secondary | ICD-10-CM | POA: Diagnosis present

## 2015-11-30 DIAGNOSIS — F0390 Unspecified dementia without behavioral disturbance: Secondary | ICD-10-CM | POA: Diagnosis present

## 2015-11-30 DIAGNOSIS — K220 Achalasia of cardia: Secondary | ICD-10-CM | POA: Diagnosis present

## 2015-11-30 DIAGNOSIS — J449 Chronic obstructive pulmonary disease, unspecified: Secondary | ICD-10-CM | POA: Diagnosis present

## 2015-11-30 DIAGNOSIS — E785 Hyperlipidemia, unspecified: Secondary | ICD-10-CM | POA: Diagnosis present

## 2015-11-30 DIAGNOSIS — A419 Sepsis, unspecified organism: Secondary | ICD-10-CM | POA: Diagnosis not present

## 2015-11-30 DIAGNOSIS — E869 Volume depletion, unspecified: Principal | ICD-10-CM | POA: Diagnosis present

## 2015-11-30 DIAGNOSIS — M25511 Pain in right shoulder: Secondary | ICD-10-CM | POA: Diagnosis present

## 2015-11-30 DIAGNOSIS — J42 Unspecified chronic bronchitis: Secondary | ICD-10-CM

## 2015-11-30 DIAGNOSIS — R680 Hypothermia, not associated with low environmental temperature: Secondary | ICD-10-CM | POA: Diagnosis present

## 2015-11-30 DIAGNOSIS — I5032 Chronic diastolic (congestive) heart failure: Secondary | ICD-10-CM | POA: Diagnosis not present

## 2015-11-30 DIAGNOSIS — K449 Diaphragmatic hernia without obstruction or gangrene: Secondary | ICD-10-CM | POA: Diagnosis present

## 2015-11-30 DIAGNOSIS — G9341 Metabolic encephalopathy: Secondary | ICD-10-CM

## 2015-11-30 DIAGNOSIS — I350 Nonrheumatic aortic (valve) stenosis: Secondary | ICD-10-CM | POA: Diagnosis present

## 2015-11-30 DIAGNOSIS — M1990 Unspecified osteoarthritis, unspecified site: Secondary | ICD-10-CM | POA: Diagnosis present

## 2015-11-30 DIAGNOSIS — G92 Toxic encephalopathy: Secondary | ICD-10-CM | POA: Diagnosis present

## 2015-11-30 DIAGNOSIS — Z66 Do not resuscitate: Secondary | ICD-10-CM | POA: Diagnosis present

## 2015-11-30 DIAGNOSIS — R55 Syncope and collapse: Secondary | ICD-10-CM | POA: Diagnosis present

## 2015-11-30 DIAGNOSIS — Z8673 Personal history of transient ischemic attack (TIA), and cerebral infarction without residual deficits: Secondary | ICD-10-CM

## 2015-11-30 DIAGNOSIS — R410 Disorientation, unspecified: Secondary | ICD-10-CM

## 2015-11-30 DIAGNOSIS — K224 Dyskinesia of esophagus: Secondary | ICD-10-CM | POA: Diagnosis present

## 2015-11-30 DIAGNOSIS — R011 Cardiac murmur, unspecified: Secondary | ICD-10-CM | POA: Diagnosis not present

## 2015-11-30 LAB — CBC WITH DIFFERENTIAL/PLATELET
Basophils Absolute: 0 10*3/uL (ref 0.0–0.1)
Basophils Relative: 0 %
EOS PCT: 2 %
Eosinophils Absolute: 0.3 10*3/uL (ref 0.0–0.7)
HEMATOCRIT: 39.2 % (ref 39.0–52.0)
Hemoglobin: 12.4 g/dL — ABNORMAL LOW (ref 13.0–17.0)
Lymphocytes Relative: 17 %
Lymphs Abs: 2.1 10*3/uL (ref 0.7–4.0)
MCH: 28.9 pg (ref 26.0–34.0)
MCHC: 31.6 g/dL (ref 30.0–36.0)
MCV: 91.4 fL (ref 78.0–100.0)
MONOS PCT: 5 %
Monocytes Absolute: 0.6 10*3/uL (ref 0.1–1.0)
Neutro Abs: 9.5 10*3/uL — ABNORMAL HIGH (ref 1.7–7.7)
Neutrophils Relative %: 76 %
PLATELETS: 135 10*3/uL — AB (ref 150–400)
RBC: 4.29 MIL/uL (ref 4.22–5.81)
RDW: 13.9 % (ref 11.5–15.5)
WBC: 12.6 10*3/uL — ABNORMAL HIGH (ref 4.0–10.5)

## 2015-11-30 LAB — URINALYSIS, ROUTINE W REFLEX MICROSCOPIC
Bilirubin Urine: NEGATIVE
Glucose, UA: NEGATIVE mg/dL
Ketones, ur: 15 mg/dL — AB
LEUKOCYTES UA: NEGATIVE
Nitrite: NEGATIVE
Protein, ur: NEGATIVE mg/dL
Specific Gravity, Urine: 1.018 (ref 1.005–1.030)
pH: 8.5 — ABNORMAL HIGH (ref 5.0–8.0)

## 2015-11-30 LAB — PROTIME-INR
INR: 1.28 (ref 0.00–1.49)
INR: 1.2 (ref 0.00–1.49)
PROTHROMBIN TIME: 15.4 s — AB (ref 11.6–15.2)
Prothrombin Time: 16.1 seconds — ABNORMAL HIGH (ref 11.6–15.2)

## 2015-11-30 LAB — URINE MICROSCOPIC-ADD ON: WBC, UA: NONE SEEN WBC/hpf (ref 0–5)

## 2015-11-30 LAB — I-STAT CG4 LACTIC ACID, ED: LACTIC ACID, VENOUS: 5.39 mmol/L — AB (ref 0.5–2.0)

## 2015-11-30 LAB — LACTIC ACID, PLASMA: Lactic Acid, Venous: 1.7 mmol/L (ref 0.5–2.0)

## 2015-11-30 LAB — COMPREHENSIVE METABOLIC PANEL
ALT: 16 U/L — AB (ref 17–63)
ANION GAP: 14 (ref 5–15)
AST: 33 U/L (ref 15–41)
Albumin: 4 g/dL (ref 3.5–5.0)
Alkaline Phosphatase: 91 U/L (ref 38–126)
BUN: 14 mg/dL (ref 6–20)
CHLORIDE: 104 mmol/L (ref 101–111)
CO2: 24 mmol/L (ref 22–32)
CREATININE: 1.05 mg/dL (ref 0.61–1.24)
Calcium: 9.3 mg/dL (ref 8.9–10.3)
Glucose, Bld: 127 mg/dL — ABNORMAL HIGH (ref 65–99)
Potassium: 4.1 mmol/L (ref 3.5–5.1)
Sodium: 142 mmol/L (ref 135–145)
Total Bilirubin: 1 mg/dL (ref 0.3–1.2)
Total Protein: 6.3 g/dL — ABNORMAL LOW (ref 6.5–8.1)

## 2015-11-30 LAB — APTT: aPTT: 29 seconds (ref 24–37)

## 2015-11-30 LAB — TYPE AND SCREEN
ABO/RH(D): O POS
Antibody Screen: NEGATIVE

## 2015-11-30 LAB — PROCALCITONIN: Procalcitonin: <0.1 ng/mL

## 2015-11-30 LAB — CK: Total CK: 68 U/L (ref 49–397)

## 2015-11-30 LAB — ABO/RH: ABO/RH(D): O POS

## 2015-11-30 LAB — TSH: TSH: 2.416 u[IU]/mL (ref 0.350–4.500)

## 2015-11-30 LAB — TROPONIN I

## 2015-11-30 MED ORDER — VANCOMYCIN HCL IN DEXTROSE 750-5 MG/150ML-% IV SOLN
750.0000 mg | INTRAVENOUS | Status: DC
  Filled 2015-11-30: qty 150

## 2015-11-30 MED ORDER — PIPERACILLIN-TAZOBACTAM 3.375 G IVPB
3.3750 g | Freq: Three times a day (TID) | INTRAVENOUS | Status: DC
  Administered 2015-12-01 (×2): 3.375 g via INTRAVENOUS
  Filled 2015-11-30 (×4): qty 50

## 2015-11-30 MED ORDER — SODIUM CHLORIDE 0.9 % IV BOLUS (SEPSIS)
500.0000 mL | Freq: Once | INTRAVENOUS | Status: AC
  Administered 2015-11-30: 500 mL via INTRAVENOUS

## 2015-11-30 MED ORDER — SODIUM CHLORIDE 0.9 % IJ SOLN
3.0000 mL | Freq: Two times a day (BID) | INTRAMUSCULAR | Status: DC
  Administered 2015-12-01: 3 mL via INTRAVENOUS

## 2015-11-30 MED ORDER — ONDANSETRON HCL 4 MG/2ML IJ SOLN
4.0000 mg | Freq: Four times a day (QID) | INTRAMUSCULAR | Status: DC | PRN
Start: 2015-11-30 — End: 2015-12-04

## 2015-11-30 MED ORDER — ACETAMINOPHEN 650 MG RE SUPP: 650.0000 mg | Freq: Four times a day (QID) | RECTAL | Status: DC | PRN

## 2015-11-30 MED ORDER — SODIUM CHLORIDE 0.9 % IV SOLN
INTRAVENOUS | Status: DC
  Administered 2015-11-30 – 2015-12-03 (×4): via INTRAVENOUS

## 2015-11-30 MED ORDER — HYDRALAZINE HCL 20 MG/ML IJ SOLN: 10.0000 mg | Freq: Four times a day (QID) | INTRAMUSCULAR | Status: DC | PRN

## 2015-11-30 MED ORDER — SODIUM CHLORIDE 0.9 % IV SOLN
INTRAVENOUS | Status: AC
  Administered 2015-11-30: 20:00:00 via INTRAVENOUS

## 2015-11-30 MED ORDER — ACETAMINOPHEN 325 MG PO TABS
650.0000 mg | ORAL_TABLET | Freq: Four times a day (QID) | ORAL | Status: DC | PRN
Start: 2015-11-30 — End: 2015-12-04
  Administered 2015-12-03: 650 mg via ORAL
  Filled 2015-11-30: qty 2

## 2015-11-30 MED ORDER — ENOXAPARIN SODIUM 30 MG/0.3ML ~~LOC~~ SOLN
30.0000 mg | SUBCUTANEOUS | Status: DC
Start: 2015-11-30 — End: 2015-12-04
  Administered 2015-11-30 – 2015-12-03 (×4): 30 mg via SUBCUTANEOUS
  Filled 2015-11-30 (×5): qty 0.3

## 2015-11-30 MED ORDER — VANCOMYCIN HCL IN DEXTROSE 1-5 GM/200ML-% IV SOLN
1000.0000 mg | Freq: Once | INTRAVENOUS | Status: AC
  Administered 2015-11-30: 1000 mg via INTRAVENOUS
  Filled 2015-11-30: qty 200

## 2015-11-30 MED ORDER — ONDANSETRON HCL 4 MG PO TABS: 4.0000 mg | ORAL_TABLET | Freq: Four times a day (QID) | ORAL | Status: DC | PRN

## 2015-11-30 MED ORDER — PIPERACILLIN-TAZOBACTAM 3.375 G IVPB 30 MIN
3.3750 g | Freq: Once | INTRAVENOUS | Status: AC
  Administered 2015-11-30: 3.375 g via INTRAVENOUS
  Filled 2015-11-30: qty 50
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
## 2015-11-30 NOTE — ED Notes (Signed)
Patients wife at nurses station, advised patient was removing the c-collar.  MD advised ok to have removed.
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
## 2015-11-30 NOTE — Progress Notes (Signed)
ANTIBIOTIC CONSULT NOTE - INITIAL  Pharmacy Consult for Vanco/Zosyn Indication: rule out sepsis  Allergies  Allergen Reactions  . Aspirin Rash    Patient Measurements: Height: 5\' 4"  (162.6 cm) Weight: 99 lb (44.906 kg) IBW/kg (Calculated) : 59.2 Adjusted Body Weight:    Vital Signs: Temp: 95.6 F (35.3 C) (01/08 1243) Temp Source: Rectal (01/08 1226) BP: 163/54 mmHg (01/08 1630) Pulse Rate: 52 (01/08 1630) Intake/Output from previous day:   Intake/Output from this shift:    Labs:  Recent Labs  11/30/15 1245  WBC 12.6*  HGB 12.4*  PLT 135*  CREATININE 1.05   Estimated Creatinine Clearance: 33.9 mL/min (by C-G formula based on Cr of 1.05). No results for input(s): VANCOTROUGH, VANCOPEAK, VANCORANDOM, GENTTROUGH, GENTPEAK, GENTRANDOM, TOBRATROUGH, TOBRAPEAK, TOBRARND, AMIKACINPEAK, AMIKACINTROU, AMIKACIN in the last 72 hours.   Microbiology: No results found for this or any previous visit (from the past 720 hour(s)).  Medical History: Past Medical History  Diagnosis Date  . Adenomatous colon polyp   . Internal hemorrhoids   . Diverticulosis   . Hypertension   . COPD (chronic obstructive pulmonary disease) ([place])   . Aortic stenosis, mild   . Hyperlipidemia   . CAD (coronary artery disease)     Status post prior myocardial infarction and multiple percutaneous interventions as described above nonobstructive disease at last cath and low-risk Myoview scan in 2007  . Stroke [facility])     Paraprocedural stoke with little residual following diagnostic catheterization, good LV function  . Aortic stenosis, mild   . Hypertension   . Hyperlipidemia   . COPD (chronic obstructive pulmonary disease) ([facility])     Medications: f/u med rec  Assessment: [name] presents to ED after LOC x 30 seconds at home after he stood up off the couch and walked down the hall. Some issues with aspiration PTA. Head CT negative after fall on Plavix PTA.  Temp 95.6, WBC 12.6, LA 5.39, INR  1.28, Scr 1.05 with estimated CrCl 33  ID: Start empiric abx for sepsis.  Goal of Therapy:  Vancomycin trough level 15-20 mcg/ml  Plan:  Vanco 1g IV x 1 and Zosyn 3.375g IV x 1 in ED on 1/8 Zosyn 3.375g IV q8hr. Vancomycin 750mg  IV q24h Vancomycin trough after 3-5 doses at steady state.      Edward [name], PharmD, BCPS Clinical Staff Pharmacist Pager [phone]  Edward [name] 11/30/2015,4:50 PM

## 2015-12-01 ENCOUNTER — Inpatient Hospital Stay (HOSPITAL_COMMUNITY): Payer: Medicare Other

## 2015-12-01 DIAGNOSIS — T68XXXD Hypothermia, subsequent encounter: Secondary | ICD-10-CM

## 2015-12-01 LAB — LACTIC ACID, PLASMA: Lactic Acid, Venous: 2.7 mmol/L (ref 0.5–2.0)

## 2015-12-01 LAB — MRSA PCR SCREENING: MRSA by PCR: NEGATIVE

## 2015-12-01 MED ORDER — LORAZEPAM 2 MG/ML IJ SOLN
0.5000 mg | Freq: Once | INTRAMUSCULAR | Status: AC
  Administered 2015-12-01: 0.5 mg via INTRAVENOUS
  Filled 2015-12-01: qty 1

## 2015-12-01 MED ORDER — CETYLPYRIDINIUM CHLORIDE 0.05 % MT LIQD
7.0000 mL | Freq: Two times a day (BID) | OROMUCOSAL | Status: DC
  Administered 2015-12-01 – 2015-12-03 (×4): 7 mL via OROMUCOSAL

## 2015-12-01 MED ORDER — CHLORHEXIDINE GLUCONATE 0.12 % MT SOLN
15.0000 mL | Freq: Two times a day (BID) | OROMUCOSAL | Status: DC
Start: 2015-12-01 — End: 2015-12-04
  Administered 2015-12-02 – 2015-12-03 (×3): 15 mL via OROMUCOSAL
  Filled 2015-12-01: qty 15
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
## 2015-12-01 NOTE — Progress Notes (Signed)
Utilization Review Completed.Wataru Mccowen T1/07/2016  

## 2015-12-02 ENCOUNTER — Inpatient Hospital Stay (HOSPITAL_COMMUNITY): Payer: Medicare Other

## 2015-12-02 DIAGNOSIS — E785 Hyperlipidemia, unspecified: Secondary | ICD-10-CM

## 2015-12-02 DIAGNOSIS — I10 Essential (primary) hypertension: Secondary | ICD-10-CM | POA: Diagnosis present

## 2015-12-02 DIAGNOSIS — R55 Syncope and collapse: Secondary | ICD-10-CM

## 2015-12-02 DIAGNOSIS — I5032 Chronic diastolic (congestive) heart failure: Secondary | ICD-10-CM

## 2015-12-02 DIAGNOSIS — G92 Toxic encephalopathy: Secondary | ICD-10-CM

## 2015-12-02 DIAGNOSIS — R001 Bradycardia, unspecified: Secondary | ICD-10-CM

## 2015-12-02 DIAGNOSIS — R1310 Dysphagia, unspecified: Secondary | ICD-10-CM

## 2015-12-02 DIAGNOSIS — A419 Sepsis, unspecified organism: Secondary | ICD-10-CM

## 2015-12-02 LAB — COMPREHENSIVE METABOLIC PANEL
ALK PHOS: 63 U/L (ref 38–126)
ALT: 14 U/L — ABNORMAL LOW (ref 17–63)
ANION GAP: 8 (ref 5–15)
AST: 25 U/L (ref 15–41)
Albumin: 3.1 g/dL — ABNORMAL LOW (ref 3.5–5.0)
BUN: 12 mg/dL (ref 6–20)
CALCIUM: 7.7 mg/dL — AB (ref 8.9–10.3)
CO2: 24 mmol/L (ref 22–32)
Chloride: 111 mmol/L (ref 101–111)
Creatinine, Ser: 1.11 mg/dL (ref 0.61–1.24)
GFR, EST NON AFRICAN AMERICAN: 59 mL/min — AB (ref >60)
Glucose, Bld: 82 mg/dL (ref 65–99)
Potassium: 3.9 mmol/L (ref 3.5–5.1)
SODIUM: 143 mmol/L (ref 135–145)
Total Bilirubin: 0.8 mg/dL (ref 0.3–1.2)
Total Protein: 5.3 g/dL — ABNORMAL LOW (ref 6.5–8.1)

## 2015-12-02 LAB — RPR: RPR Ser Ql: NONREACTIVE

## 2015-12-02 LAB — RETICULOCYTES
RBC.: 3.7 MIL/uL — ABNORMAL LOW (ref 4.22–5.81)
RETIC COUNT ABSOLUTE: 18.5 10*3/uL — AB (ref 19.0–186.0)
Retic Ct Pct: 0.5 % (ref 0.4–3.1)

## 2015-12-02 LAB — CBC
HCT: 33.6 % — ABNORMAL LOW (ref 39.0–52.0)
HEMOGLOBIN: 10.6 g/dL — AB (ref 13.0–17.0)
MCH: 28.6 pg (ref 26.0–34.0)
MCHC: 31.5 g/dL (ref 30.0–36.0)
MCV: 90.8 fL (ref 78.0–100.0)
Platelets: 127 10*3/uL — ABNORMAL LOW (ref 150–400)
RBC: 3.7 MIL/uL — AB (ref 4.22–5.81)
RDW: 14.1 % (ref 11.5–15.5)
WBC: 6 10*3/uL (ref 4.0–10.5)

## 2015-12-02 LAB — IRON AND TIBC
IRON: 50 ug/dL (ref 45–182)
SATURATION RATIOS: 22 % (ref 17.9–39.5)
TIBC: 231 ug/dL — ABNORMAL LOW (ref 250–450)
UIBC: 181 ug/dL

## 2015-12-02 LAB — HIV ANTIBODY (ROUTINE TESTING W REFLEX): HIV SCREEN 4TH GENERATION: NONREACTIVE

## 2015-12-02 LAB — FERRITIN: Ferritin: 42 ng/mL (ref 24–336)

## 2015-12-02 LAB — FOLATE: FOLATE: 14.2 ng/mL (ref >5.9)

## 2015-12-02 LAB — AMMONIA: AMMONIA: 17 umol/L (ref 9–35)

## 2015-12-02 LAB — PROCALCITONIN

## 2015-12-02 LAB — VITAMIN B12: Vitamin B-12: 417 pg/mL (ref 180–914)
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
## 2015-12-03 ENCOUNTER — Inpatient Hospital Stay (HOSPITAL_COMMUNITY): Payer: Medicare Other

## 2015-12-03 DIAGNOSIS — I350 Nonrheumatic aortic (valve) stenosis: Secondary | ICD-10-CM

## 2015-12-03 LAB — RESPIRATORY VIRUS PANEL
ADENOVIRUS: NEGATIVE
INFLUENZA A: NEGATIVE
Influenza B: NEGATIVE
Metapneumovirus: NEGATIVE
Parainfluenza 1: NEGATIVE
Parainfluenza 2: NEGATIVE
Parainfluenza 3: NEGATIVE
RESPIRATORY SYNCYTIAL VIRUS A: NEGATIVE
RESPIRATORY SYNCYTIAL VIRUS B: NEGATIVE
RHINOVIRUS: NEGATIVE

## 2015-12-03 MED ORDER — METOCLOPRAMIDE HCL 10 MG PO TABS
5.0000 mg | ORAL_TABLET | Freq: Three times a day (TID) | ORAL | Status: DC
  Administered 2015-12-03 – 2015-12-04 (×3): 5 mg via ORAL
  Filled 2015-12-03 (×3): qty 1

## 2015-12-03 NOTE — Progress Notes (Signed)
*  PRELIMINARY RESULTS* Echocardiogram 2D Echocardiogram has been performed.  Edward [name] 12/03/2015, 4:31 PM
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
## 2015-12-04 DIAGNOSIS — I2510 Atherosclerotic heart disease of native coronary artery without angina pectoris: Secondary | ICD-10-CM

## 2015-12-04 DIAGNOSIS — J438 Other emphysema: Secondary | ICD-10-CM

## 2015-12-04 MED ORDER — METOCLOPRAMIDE HCL 5 MG PO TABS: 5.0000 mg | ORAL_TABLET | Freq: Three times a day (TID) | ORAL | Status: DC
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
## 2015-12-04 NOTE — Care Management Important Message (Signed)
Important Message  Patient Details  Name: Edward [name] MRN: [id number] Date of Birth: [birth year]   Medicare Important Message Given:  Yes    Nathen May 12/04/2015, 12:25 PM

## 2015-12-04 NOTE — Progress Notes (Signed)
PT Cancellation Note  Patient Details Name: Edward [name] MRN: [id number] DOB: March 10, 1930   Cancelled Treatment:    Reason Eval/Treat Not Completed: Other (comment) (pt d/cing home and refused to work with PT.)   Irwin Brakeman F 12/04/2015, 11:13 AM  [facility] [phone] (pager)

## 2015-12-05 LAB — CULTURE, BLOOD (ROUTINE X 2)
CULTURE: NO GROWTH
Culture: NO GROWTH

## 2015-12-11 ENCOUNTER — Other Ambulatory Visit: Payer: Self-pay | Admitting: Cardiovascular Disease

## 2016-01-01 ENCOUNTER — Other Ambulatory Visit: Payer: Self-pay | Admitting: Cardiovascular Disease

## 2016-01-02 ENCOUNTER — Ambulatory Visit (INDEPENDENT_AMBULATORY_CARE_PROVIDER_SITE_OTHER): Payer: Medicare Other | Admitting: Cardiovascular Disease

## 2016-01-02 ENCOUNTER — Encounter: Payer: Self-pay | Admitting: Cardiovascular Disease

## 2016-01-02 VITALS — BP 140/62 | HR 54 | Ht 60.0 in | Wt 104.4 lb

## 2016-01-02 DIAGNOSIS — I2510 Atherosclerotic heart disease of native coronary artery without angina pectoris: Secondary | ICD-10-CM | POA: Diagnosis not present

## 2016-01-02 DIAGNOSIS — I10 Essential (primary) hypertension: Secondary | ICD-10-CM

## 2016-01-02 DIAGNOSIS — I639 Cerebral infarction, unspecified: Secondary | ICD-10-CM

## 2016-01-02 DIAGNOSIS — E785 Hyperlipidemia, unspecified: Secondary | ICD-10-CM

## 2016-01-02 DIAGNOSIS — I350 Nonrheumatic aortic (valve) stenosis: Secondary | ICD-10-CM

## 2016-01-02 DIAGNOSIS — I340 Nonrheumatic mitral (valve) insufficiency: Secondary | ICD-10-CM | POA: Diagnosis not present

## 2016-01-02 NOTE — Patient Instructions (Signed)
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
## 2016-01-07 ENCOUNTER — Other Ambulatory Visit: Payer: Self-pay | Admitting: Cardiovascular Disease

## 2016-02-21 ENCOUNTER — Emergency Department (HOSPITAL_COMMUNITY)
Admission: EM | Admit: 2016-02-21 | Discharge: 2016-02-21 | Disposition: A | Discharge status: Home / Self Care | Payer: Medicare Other | Attending: Emergency Medicine | Admitting: Emergency Medicine

## 2016-02-21 ENCOUNTER — Emergency Department (HOSPITAL_COMMUNITY): Payer: Medicare Other

## 2016-02-21 ENCOUNTER — Encounter (HOSPITAL_COMMUNITY): Payer: Self-pay | Admitting: Emergency Medicine

## 2016-02-21 DIAGNOSIS — G459 Transient cerebral ischemic attack, unspecified: Secondary | ICD-10-CM | POA: Diagnosis not present

## 2016-02-21 DIAGNOSIS — Z7951 Long term (current) use of inhaled steroids: Secondary | ICD-10-CM | POA: Insufficient documentation

## 2016-02-21 DIAGNOSIS — J449 Chronic obstructive pulmonary disease, unspecified: Secondary | ICD-10-CM | POA: Insufficient documentation

## 2016-02-21 DIAGNOSIS — Z79899 Other long term (current) drug therapy: Secondary | ICD-10-CM | POA: Insufficient documentation

## 2016-02-21 DIAGNOSIS — F0390 Unspecified dementia without behavioral disturbance: Secondary | ICD-10-CM | POA: Insufficient documentation

## 2016-02-21 DIAGNOSIS — E785 Hyperlipidemia, unspecified: Secondary | ICD-10-CM | POA: Insufficient documentation

## 2016-02-21 DIAGNOSIS — I10 Essential (primary) hypertension: Secondary | ICD-10-CM | POA: Diagnosis not present

## 2016-02-21 DIAGNOSIS — R2990 Unspecified symptoms and signs involving the nervous system: Secondary | ICD-10-CM | POA: Diagnosis not present

## 2016-02-21 DIAGNOSIS — Z86010 Personal history of colonic polyps: Secondary | ICD-10-CM | POA: Insufficient documentation

## 2016-02-21 DIAGNOSIS — R202 Paresthesia of skin: Secondary | ICD-10-CM | POA: Diagnosis present

## 2016-02-21 DIAGNOSIS — I2510 Atherosclerotic heart disease of native coronary artery without angina pectoris: Secondary | ICD-10-CM | POA: Diagnosis not present

## 2016-02-21 DIAGNOSIS — R4781 Slurred speech: Secondary | ICD-10-CM | POA: Diagnosis not present

## 2016-02-21 LAB — APTT: aPTT: 27 seconds (ref 24–37)

## 2016-02-21 LAB — COMPREHENSIVE METABOLIC PANEL
ALBUMIN: 3.8 g/dL (ref 3.5–5.0)
ALK PHOS: 113 U/L (ref 38–126)
ALT: 19 U/L (ref 17–63)
ANION GAP: 8 (ref 5–15)
AST: 27 U/L (ref 15–41)
BILIRUBIN TOTAL: 0.7 mg/dL (ref 0.3–1.2)
BUN: 13 mg/dL (ref 6–20)
CALCIUM: 8.8 mg/dL — AB (ref 8.9–10.3)
CO2: 25 mmol/L (ref 22–32)
CREATININE: 1.18 mg/dL (ref 0.61–1.24)
Chloride: 104 mmol/L (ref 101–111)
GFR calc non Af Amer: 55 mL/min — ABNORMAL LOW (ref >60)
GLUCOSE: 175 mg/dL — AB (ref 65–99)
Potassium: 4.2 mmol/L (ref 3.5–5.1)
Sodium: 137 mmol/L (ref 135–145)
TOTAL PROTEIN: 6.3 g/dL — AB (ref 6.5–8.1)

## 2016-02-21 LAB — PROTIME-INR
INR: 1.05 (ref 0.00–1.49)
PROTHROMBIN TIME: 13.9 s (ref 11.6–15.2)

## 2016-02-21 LAB — I-STAT CHEM 8, ED
BUN: 15 mg/dL (ref 6–20)
CALCIUM ION: 1.08 mmol/L — AB (ref 1.13–1.30)
CHLORIDE: 102 mmol/L (ref 101–111)
Creatinine, Ser: 1.2 mg/dL (ref 0.61–1.24)
GLUCOSE: 167 mg/dL — AB (ref 65–99)
HEMATOCRIT: 37 % — AB (ref 39.0–52.0)
HEMOGLOBIN: 12.6 g/dL — AB (ref 13.0–17.0)
POTASSIUM: 4.2 mmol/L (ref 3.5–5.1)
SODIUM: 139 mmol/L (ref 135–145)
TCO2: 24 mmol/L (ref 0–100)

## 2016-02-21 LAB — I-STAT TROPONIN, ED: Troponin i, poc: 0 ng/mL (ref 0.00–0.08)

## 2016-02-21 LAB — CBC WITH DIFFERENTIAL/PLATELET
BASOS PCT: 0 %
Basophils Absolute: 0 10*3/uL (ref 0.0–0.1)
EOS ABS: 0 10*3/uL (ref 0.0–0.7)
EOS PCT: 0 %
HCT: 34.7 % — ABNORMAL LOW (ref 39.0–52.0)
HEMOGLOBIN: 11.4 g/dL — AB (ref 13.0–17.0)
LYMPHS ABS: 1.7 10*3/uL (ref 0.7–4.0)
Lymphocytes Relative: 20 %
MCH: 28.4 pg (ref 26.0–34.0)
MCHC: 32.9 g/dL (ref 30.0–36.0)
MCV: 86.5 fL (ref 78.0–100.0)
MONOS PCT: 9 %
Monocytes Absolute: 0.7 10*3/uL (ref 0.1–1.0)
NEUTROS PCT: 71 %
Neutro Abs: 5.9 10*3/uL (ref 1.7–7.7)
PLATELETS: 178 10*3/uL (ref 150–400)
RBC: 4.01 MIL/uL — ABNORMAL LOW (ref 4.22–5.81)
RDW: 13.3 % (ref 11.5–15.5)
WBC: 8.3 10*3/uL (ref 4.0–10.5)

## 2016-02-21 NOTE — ED Notes (Signed)
Dr Beaton at bedside 
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
## 2016-02-21 NOTE — ED Notes (Signed)
Pt arrives via [facility], ems reports pt drove himself to church this am, while at church with friends they noticed patient was having trouble speaking, ems reports pt had near syncopal episode and was helped to chair by friends. Ems reports pt has equal strength bilaterally but did have some slurred speech with them. Pt reported some nausea en route, but did not vomit. Pt alert, airway maintained upon arrival.

## 2016-02-21 NOTE — Discharge Instructions (Signed)
Transient Ischemic Attack °A transient ischemic attack (TIA) is a "warning stroke" that causes stroke-like symptoms. Unlike a stroke, a TIA does not cause permanent damage to the brain. The symptoms of a TIA can happen very fast and do not last long. It is important to know the symptoms of a TIA and what to do. This can help prevent a major stroke or death. °CAUSES  °A TIA is caused by a temporary blockage in an artery in the brain or neck (carotid artery). The blockage does not allow the brain to get the blood supply it needs and can cause different symptoms. The blockage can be caused by either: °· A blood clot. °· Fatty buildup (plaque) in a neck or brain artery. °RISK FACTORS °· High blood pressure (hypertension). °· High cholesterol. °· Diabetes mellitus. °· Heart disease. °· The buildup of plaque in the blood vessels (peripheral artery disease or atherosclerosis). °· The buildup of plaque in the blood vessels that provide blood and oxygen to the brain (carotid artery stenosis). °· An abnormal heart rhythm (atrial fibrillation). °· Obesity. °· Using any tobacco products, including cigarettes, chewing tobacco, or electronic cigarettes. °· Taking oral contraceptives, especially in combination with using tobacco. °· Physical inactivity. °· A diet high in fats, salt (sodium), and calories. °· Excessive alcohol use. °· Use of illegal drugs (especially cocaine and methamphetamine). °· Being male. °· Being African American. °· Being over the age of 55 years. °· Family history of stroke. °· Previous history of blood clots, stroke, TIA, or heart attack. °· Sickle cell disease. °SIGNS AND SYMPTOMS  °TIA symptoms are the same as a stroke but are temporary. These symptoms usually develop suddenly, or may be newly present upon waking from sleep: °· Sudden weakness or numbness of the face, arm, or leg, especially on one side of the body. °· Sudden trouble walking or difficulty moving arms or legs. °· Sudden  confusion. °· Sudden personality changes. °· Trouble speaking (aphasia) or understanding. °· Difficulty swallowing. °· Sudden trouble seeing in one or both eyes. °· Double vision. °· Dizziness. °· Loss of balance or coordination. °· Sudden severe headache with no known cause. °· Trouble reading or writing. °· Loss of bowel or bladder control. °· Loss of consciousness. °DIAGNOSIS  °Your health care provider may be able to determine the presence or absence of a TIA based on your symptoms, history, and physical exam. CT scan of the brain is usually performed to help identify a TIA. Other tests may include: °· Electrocardiography (ECG). °· Continuous heart monitoring. °· Echocardiography. °· Carotid ultrasonography. °· MRI. °· A scan of the brain circulation. °· Blood tests. °TREATMENT  °Since the symptoms of TIA are the same as a stroke, it is important to seek treatment as soon as possible. You may need a medicine to dissolve a blood clot (thrombolytic) if that is the cause of the TIA. This medicine cannot be given if too much time has passed. Treatment may also include:  °· Rest, oxygen, fluids through an IV tube, and medicines to thin the blood (anticoagulants). °· Measures will be taken to prevent short-term and long-term complications, including infection from breathing foreign material into the lungs (aspiration pneumonia), blood clots in the legs, and falls. °· Procedures to either remove plaque in the carotid arteries or dilate carotid arteries that have narrowed due to plaque. Those procedures are: °¨ Carotid endarterectomy. °¨ Carotid angioplasty and stenting. °· Medicines and diet may be used to address diabetes, high blood pressure, and   other underlying risk factors. °HOME CARE INSTRUCTIONS  °· Take medicines only as directed by your health care provider. Follow the directions carefully. Medicines may be used to control risk factors for a stroke. Be sure you understand all your medicine instructions. °· You  may be told to take aspirin or the anticoagulant warfarin. Warfarin needs to be taken exactly as instructed. °¨ Taking too much or too little warfarin is dangerous. Too much warfarin increases the risk of bleeding. Too little warfarin continues to allow the risk for blood clots. While taking warfarin, you will need to have regular blood tests to measure your blood clotting time. A PT blood test measures how long it takes for blood to clot. Your PT is used to calculate another value called an INR. Your PT and INR help your health care provider to adjust your dose of warfarin. The dose can change for many reasons. It is critically important that you take warfarin exactly as prescribed. °¨ Many foods, especially foods high in vitamin K can interfere with warfarin and affect the PT and INR. Foods high in vitamin K include spinach, kale, broccoli, cabbage, collard and turnip greens, Brussels sprouts, peas, cauliflower, seaweed, and parsley, as well as beef and pork liver, green tea, and soybean oil. You should eat a consistent amount of foods high in vitamin K. Avoid major changes in your diet, or notify your health care provider before changing your diet. Arrange a visit with a dietitian to answer your questions. °¨ Many medicines can interfere with warfarin and affect the PT and INR. You must tell your health care provider about any and all medicines you take; this includes all vitamins and supplements. Be especially cautious with aspirin and anti-inflammatory medicines. Do not take or discontinue any prescribed or over-the-counter medicine except on the advice of your health care provider or pharmacist. °¨ Warfarin can have side effects, such as excessive bruising or bleeding. You will need to hold pressure over cuts for longer than usual. Your health care provider or pharmacist will discuss other potential side effects. °¨ Avoid sports or activities that may cause injury or bleeding. °¨ Be careful when shaving,  flossing your teeth, or handling sharp objects. °¨ Alcohol can change the body's ability to handle warfarin. It is best to avoid alcoholic drinks or consume only very small amounts while taking warfarin. Notify your health care provider if you change your alcohol intake. °¨ Notify your dentist or other health care providers before procedures. °· Eat a diet that includes 5 or more servings of fruits and vegetables each day. This may reduce the risk of stroke. Certain diets may be prescribed to address high blood pressure, high cholesterol, diabetes, or obesity. °¨ A diet low in sodium, saturated fat, trans fat, and cholesterol is recommended to manage high blood pressure. °¨ A diet low in saturated fat, trans fat, and cholesterol, and high in fiber may control cholesterol levels. °¨ A controlled-carbohydrate, controlled-sugar diet is recommended to manage diabetes. °¨ A reduced-calorie diet that is low in sodium, saturated fat, trans fat, and cholesterol is recommended to manage obesity. °· Maintain a healthy weight. °· Stay physically active. It is recommended that you get at least 30 minutes of activity on most or all days. °· Do not use any tobacco products, including cigarettes, chewing tobacco, or electronic cigarettes. If you need help quitting, ask your health care provider. °· Limit alcohol intake to no more than 1 drink per day for nonpregnant women and 2 drinks   per day for men. One drink equals 12 ounces of beer, 5 ounces of wine, or 1½ ounces of hard liquor. °· Do not abuse drugs. °· A safe home environment is important to reduce the risk of falls. Your health care provider may arrange for specialists to evaluate your home. Having grab bars in the bedroom and bathroom is often important. Your health care provider may arrange for equipment to be used at home, such as raised toilets and a seat for the shower. °· Follow all instructions for follow-up with your health care provider. This is very important.  This includes any referrals and lab tests. Proper follow-up can prevent a stroke or another TIA from occurring. °PREVENTION  °The risk of a TIA can be decreased by appropriately treating high blood pressure, high cholesterol, diabetes, heart disease, and obesity, and by quitting smoking, limiting alcohol, and staying physically active. °SEEK MEDICAL CARE IF: °· You have personality changes. °· You have difficulty swallowing. °· You are seeing double. °· You have dizziness. °· You have a fever. °SEEK IMMEDIATE MEDICAL CARE IF:  °Any of the following symptoms may represent a serious problem that is an emergency. Do not wait to see if the symptoms will go away. Get medical help right away. Call your local emergency services (911 in [place] Do not drive yourself to the hospital. °· You have sudden weakness or numbness of the face, arm, or leg, especially on one side of the body. °· You have sudden trouble walking or difficulty moving arms or legs. °· You have sudden confusion. °· You have trouble speaking (aphasia) or understanding. °· You have sudden trouble seeing in one or both eyes. °· You have a loss of balance or coordination. °· You have a sudden, severe headache with no known cause. °· You have new chest pain or an irregular heartbeat. °· You have a partial or total loss of consciousness. °MAKE SURE YOU:  °· Understand these instructions. °· Will watch your condition. °· Will get help right away if you are not doing well or get worse. °  °This information is not intended to replace advice given to you by your health care provider. Make sure you discuss any questions you have with your health care provider. °  °Document Released: 08/18/2005 Document Revised: 11/29/2014 Document Reviewed: 02/13/2014 °Elsevier Interactive Patient Education ©2016 [employer] ° °

## 2016-02-21 NOTE — ED Notes (Signed)
Dr. Silverio Decamp at bedside advised he does not feel patient has had a stroke at this time based on CT and his assessment.
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
## 2016-02-21 NOTE — ED Notes (Signed)
Pt is in stable condition upon d/c and ambulates from ED. 

## 2016-02-21 NOTE — ED Notes (Signed)
MRI contacted in regards to when they would be taking patient for scan, MRI tech advised they should be able to take patient in about 20 minutes.

## 2016-02-21 NOTE — ED Notes (Signed)
Patient transported to MRI 

## 2016-08-10 ENCOUNTER — Other Ambulatory Visit: Payer: Self-pay | Admitting: Cardiovascular Disease

## 2016-08-20 ENCOUNTER — Encounter (INDEPENDENT_AMBULATORY_CARE_PROVIDER_SITE_OTHER): Payer: Self-pay

## 2016-08-20 ENCOUNTER — Encounter: Payer: Self-pay | Admitting: Cardiovascular Disease

## 2016-08-20 ENCOUNTER — Ambulatory Visit (INDEPENDENT_AMBULATORY_CARE_PROVIDER_SITE_OTHER): Payer: Medicare Other | Admitting: Cardiovascular Disease

## 2016-08-20 VITALS — BP 132/52 | HR 48 | Ht 60.0 in | Wt 103.0 lb

## 2016-08-20 DIAGNOSIS — I2510 Atherosclerotic heart disease of native coronary artery without angina pectoris: Secondary | ICD-10-CM

## 2016-08-20 DIAGNOSIS — I340 Nonrheumatic mitral (valve) insufficiency: Secondary | ICD-10-CM

## 2016-08-20 DIAGNOSIS — E785 Hyperlipidemia, unspecified: Secondary | ICD-10-CM

## 2016-08-20 DIAGNOSIS — I10 Essential (primary) hypertension: Secondary | ICD-10-CM | POA: Diagnosis not present

## 2016-08-20 DIAGNOSIS — I350 Nonrheumatic aortic (valve) stenosis: Secondary | ICD-10-CM | POA: Diagnosis not present

## 2016-08-20 DIAGNOSIS — R001 Bradycardia, unspecified: Secondary | ICD-10-CM

## 2016-08-20 DIAGNOSIS — I639 Cerebral infarction, unspecified: Secondary | ICD-10-CM
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
## 2016-08-20 NOTE — Patient Instructions (Signed)

## 2016-09-01 ENCOUNTER — Encounter: Payer: Self-pay | Admitting: Cardiovascular Disease

## 2016-09-04 ENCOUNTER — Emergency Department (HOSPITAL_COMMUNITY): Payer: Medicare Other

## 2016-09-04 ENCOUNTER — Observation Stay (HOSPITAL_COMMUNITY)
Admission: EM | Admit: 2016-09-04 | Discharge: 2016-09-06 | Disposition: A | Discharge status: Home / Self Care | Payer: Medicare Other | Attending: Internal Medicine | Admitting: Internal Medicine

## 2016-09-04 ENCOUNTER — Encounter (HOSPITAL_COMMUNITY): Payer: Self-pay | Admitting: Nurse Practitioner

## 2016-09-04 DIAGNOSIS — Z79899 Other long term (current) drug therapy: Secondary | ICD-10-CM | POA: Diagnosis not present

## 2016-09-04 DIAGNOSIS — I4510 Unspecified right bundle-branch block: Secondary | ICD-10-CM | POA: Insufficient documentation

## 2016-09-04 DIAGNOSIS — E784 Other hyperlipidemia: Secondary | ICD-10-CM

## 2016-09-04 DIAGNOSIS — Z8673 Personal history of transient ischemic attack (TIA), and cerebral infarction without residual deficits: Secondary | ICD-10-CM | POA: Insufficient documentation

## 2016-09-04 DIAGNOSIS — Z7951 Long term (current) use of inhaled steroids: Secondary | ICD-10-CM | POA: Insufficient documentation

## 2016-09-04 DIAGNOSIS — R55 Syncope and collapse: Secondary | ICD-10-CM | POA: Diagnosis not present

## 2016-09-04 DIAGNOSIS — I252 Old myocardial infarction: Secondary | ICD-10-CM | POA: Insufficient documentation

## 2016-09-04 DIAGNOSIS — Z7902 Long term (current) use of antithrombotics/antiplatelets: Secondary | ICD-10-CM | POA: Insufficient documentation

## 2016-09-04 DIAGNOSIS — R531 Weakness: Secondary | ICD-10-CM | POA: Diagnosis not present

## 2016-09-04 DIAGNOSIS — R001 Bradycardia, unspecified: Secondary | ICD-10-CM | POA: Insufficient documentation

## 2016-09-04 DIAGNOSIS — I440 Atrioventricular block, first degree: Secondary | ICD-10-CM | POA: Insufficient documentation

## 2016-09-04 DIAGNOSIS — I2510 Atherosclerotic heart disease of native coronary artery without angina pectoris: Secondary | ICD-10-CM | POA: Diagnosis not present

## 2016-09-04 DIAGNOSIS — I350 Nonrheumatic aortic (valve) stenosis: Secondary | ICD-10-CM | POA: Insufficient documentation

## 2016-09-04 DIAGNOSIS — I5032 Chronic diastolic (congestive) heart failure: Secondary | ICD-10-CM | POA: Diagnosis not present

## 2016-09-04 DIAGNOSIS — Z955 Presence of coronary angioplasty implant and graft: Secondary | ICD-10-CM | POA: Insufficient documentation

## 2016-09-04 DIAGNOSIS — J449 Chronic obstructive pulmonary disease, unspecified: Secondary | ICD-10-CM | POA: Diagnosis present

## 2016-09-04 DIAGNOSIS — I359 Nonrheumatic aortic valve disorder, unspecified: Secondary | ICD-10-CM | POA: Diagnosis present

## 2016-09-04 DIAGNOSIS — E785 Hyperlipidemia, unspecified: Secondary | ICD-10-CM | POA: Diagnosis not present

## 2016-09-04 DIAGNOSIS — I110 Hypertensive heart disease with heart failure: Secondary | ICD-10-CM | POA: Insufficient documentation

## 2016-09-04 DIAGNOSIS — I10 Essential (primary) hypertension: Secondary | ICD-10-CM | POA: Diagnosis present

## 2016-09-04 LAB — CBC WITH DIFFERENTIAL/PLATELET
BASOS PCT: 0 %
Basophils Absolute: 0 10*3/uL (ref 0.0–0.1)
EOS ABS: 0.2 10*3/uL (ref 0.0–0.7)
Eosinophils Relative: 3 %
HEMATOCRIT: 33 % — AB (ref 39.0–52.0)
HEMOGLOBIN: 10.5 g/dL — AB (ref 13.0–17.0)
LYMPHS ABS: 1.6 10*3/uL (ref 0.7–4.0)
Lymphocytes Relative: 26 %
MCH: 28.4 pg (ref 26.0–34.0)
MCHC: 31.8 g/dL (ref 30.0–36.0)
MCV: 89.2 fL (ref 78.0–100.0)
Monocytes Absolute: 0.4 10*3/uL (ref 0.1–1.0)
Monocytes Relative: 7 %
NEUTROS ABS: 3.9 10*3/uL (ref 1.7–7.7)
NEUTROS PCT: 64 %
Platelets: 142 10*3/uL — ABNORMAL LOW (ref 150–400)
RBC: 3.7 MIL/uL — AB (ref 4.22–5.81)
RDW: 13.8 % (ref 11.5–15.5)
WBC: 6 10*3/uL (ref 4.0–10.5)

## 2016-09-04 LAB — URINALYSIS, ROUTINE W REFLEX MICROSCOPIC
BILIRUBIN URINE: NEGATIVE
GLUCOSE, UA: NEGATIVE mg/dL
Hgb urine dipstick: NEGATIVE
Ketones, ur: NEGATIVE mg/dL
Leukocytes, UA: NEGATIVE
NITRITE: NEGATIVE
PH: 6 (ref 5.0–8.0)
Protein, ur: NEGATIVE mg/dL
SPECIFIC GRAVITY, URINE: 1.019 (ref 1.005–1.030)

## 2016-09-04 LAB — COMPREHENSIVE METABOLIC PANEL
ALBUMIN: 4.1 g/dL (ref 3.5–5.0)
ALT: 15 U/L — ABNORMAL LOW (ref 17–63)
ANION GAP: 6 (ref 5–15)
AST: 24 U/L (ref 15–41)
Alkaline Phosphatase: 105 U/L (ref 38–126)
BILIRUBIN TOTAL: 0.8 mg/dL (ref 0.3–1.2)
BUN: 16 mg/dL (ref 6–20)
CHLORIDE: 106 mmol/L (ref 101–111)
CO2: 26 mmol/L (ref 22–32)
Calcium: 8.8 mg/dL — ABNORMAL LOW (ref 8.9–10.3)
Creatinine, Ser: 0.95 mg/dL (ref 0.61–1.24)
GFR calc Af Amer: >60 mL/min (ref >60)
GFR calc non Af Amer: >60 mL/min (ref >60)
GLUCOSE: 98 mg/dL (ref 65–99)
POTASSIUM: 4.6 mmol/L (ref 3.5–5.1)
SODIUM: 138 mmol/L (ref 135–145)
TOTAL PROTEIN: 6.2 g/dL — AB (ref 6.5–8.1)

## 2016-09-04 LAB — CBC
HEMATOCRIT: 37.1 % — AB (ref 39.0–52.0)
HEMOGLOBIN: 11.5 g/dL — AB (ref 13.0–17.0)
MCH: 27.8 pg (ref 26.0–34.0)
MCHC: 31 g/dL (ref 30.0–36.0)
MCV: 89.8 fL (ref 78.0–100.0)
Platelets: 162 10*3/uL (ref 150–400)
RBC: 4.13 MIL/uL — ABNORMAL LOW (ref 4.22–5.81)
RDW: 13.7 % (ref 11.5–15.5)
WBC: 7.4 10*3/uL (ref 4.0–10.5)

## 2016-09-04 LAB — TROPONIN I: Troponin I: <0.03 ng/mL (ref <0.03)

## 2016-09-04 LAB — CBG MONITORING, ED: Glucose-Capillary: 74 mg/dL (ref 65–99)

## 2016-09-04 MED ORDER — ACETAMINOPHEN 325 MG PO TABS
650.0000 mg | ORAL_TABLET | Freq: Four times a day (QID) | ORAL | Status: DC | PRN
Start: 2016-09-04 — End: 2016-09-06

## 2016-09-04 MED ORDER — SODIUM CHLORIDE 0.9% FLUSH
3.0000 mL | Freq: Two times a day (BID) | INTRAVENOUS | Status: DC
  Administered 2016-09-04 – 2016-09-06 (×3): 3 mL via INTRAVENOUS

## 2016-09-04 MED ORDER — SERTRALINE HCL 50 MG PO TABS
25.0000 mg | ORAL_TABLET | Freq: Every day | ORAL | Status: DC
  Administered 2016-09-05 – 2016-09-06 (×2): 25 mg via ORAL
  Filled 2016-09-04 (×3): qty 1

## 2016-09-04 MED ORDER — METOCLOPRAMIDE HCL 5 MG PO TABS
5.0000 mg | ORAL_TABLET | Freq: Three times a day (TID) | ORAL | Status: DC
  Administered 2016-09-04 – 2016-09-06 (×7): 5 mg via ORAL
  Filled 2016-09-04 (×7): qty 1

## 2016-09-04 MED ORDER — MOMETASONE FURO-FORMOTEROL FUM 200-5 MCG/ACT IN AERO
2.0000 | INHALATION_SPRAY | Freq: Two times a day (BID) | RESPIRATORY_TRACT | Status: DC
  Administered 2016-09-04 – 2016-09-06 (×4): 2 via RESPIRATORY_TRACT
  Filled 2016-09-04: qty 8.8

## 2016-09-04 MED ORDER — METOPROLOL TARTRATE 12.5 MG HALF TABLET: 12.5000 mg | ORAL_TABLET | Freq: Two times a day (BID) | ORAL | Status: DC

## 2016-09-04 MED ORDER — ACETAMINOPHEN 650 MG RE SUPP: 650.0000 mg | Freq: Four times a day (QID) | RECTAL | Status: DC | PRN

## 2016-09-04 MED ORDER — FLUTICASONE PROPIONATE 50 MCG/ACT NA SUSP
2.0000 | Freq: Every day | NASAL | Status: DC
  Administered 2016-09-05 – 2016-09-06 (×2): 2 via NASAL
  Filled 2016-09-04 (×2): qty 16

## 2016-09-04 MED ORDER — CLOPIDOGREL BISULFATE 75 MG PO TABS
75.0000 mg | ORAL_TABLET | Freq: Every day | ORAL | Status: DC
  Administered 2016-09-05 – 2016-09-06 (×2): 75 mg via ORAL
  Filled 2016-09-04 (×2): qty 1

## 2016-09-04 MED ORDER — MONTELUKAST SODIUM 10 MG PO TABS
10.0000 mg | ORAL_TABLET | Freq: Every day | ORAL | Status: DC
  Administered 2016-09-04 – 2016-09-05 (×2): 10 mg via ORAL
  Filled 2016-09-04 (×2): qty 1

## 2016-09-04 MED ORDER — RAMIPRIL 2.5 MG PO CAPS
2.5000 mg | ORAL_CAPSULE | Freq: Every day | ORAL | Status: DC
  Administered 2016-09-05 – 2016-09-06 (×2): 2.5 mg via ORAL
  Filled 2016-09-04 (×2): qty 1

## 2016-09-04 MED ORDER — SODIUM CHLORIDE 0.9 % IV BOLUS (SEPSIS)
500.0000 mL | Freq: Once | INTRAVENOUS | Status: AC
  Administered 2016-09-04: 500 mL via INTRAVENOUS

## 2016-09-04 MED ORDER — PANTOPRAZOLE SODIUM 40 MG PO TBEC
40.0000 mg | DELAYED_RELEASE_TABLET | Freq: Every day | ORAL | Status: DC
  Administered 2016-09-05 – 2016-09-06 (×2): 40 mg via ORAL
  Filled 2016-09-04 (×2): qty 1

## 2016-09-04 MED ORDER — ATORVASTATIN CALCIUM 40 MG PO TABS
40.0000 mg | ORAL_TABLET | Freq: Every day | ORAL | Status: DC
  Administered 2016-09-05 – 2016-09-06 (×2): 40 mg via ORAL
  Filled 2016-09-04 (×2): qty 1

## 2016-09-04 MED ORDER — SODIUM CHLORIDE 0.9 % IV SOLN
INTRAVENOUS | Status: DC
  Administered 2016-09-05: via INTRAVENOUS
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
## 2016-09-04 NOTE — ED Notes (Signed)
Attempted report 

## 2016-09-04 NOTE — ED Notes (Signed)
Pt eating dinner tray °
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
## 2016-09-04 NOTE — ED Triage Notes (Signed)
Pt presents with chief complaint of diarrhea. He woke this morning and ate breakfast then immediately began to have diarrhea. He reports malaise. He denies n/v, fevers, pain. His family is concerned because he has a history of dehydration. He is alert, breathing easily
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
## 2016-09-05 ENCOUNTER — Observation Stay (HOSPITAL_BASED_OUTPATIENT_CLINIC_OR_DEPARTMENT_OTHER): Payer: Medicare Other

## 2016-09-05 DIAGNOSIS — R55 Syncope and collapse: Secondary | ICD-10-CM

## 2016-09-05 DIAGNOSIS — I509 Heart failure, unspecified: Secondary | ICD-10-CM

## 2016-09-05 DIAGNOSIS — R001 Bradycardia, unspecified: Secondary | ICD-10-CM | POA: Diagnosis not present

## 2016-09-05 DIAGNOSIS — I359 Nonrheumatic aortic valve disorder, unspecified: Secondary | ICD-10-CM | POA: Diagnosis not present

## 2016-09-05 DIAGNOSIS — I10 Essential (primary) hypertension: Secondary | ICD-10-CM

## 2016-09-05 LAB — COMPREHENSIVE METABOLIC PANEL
ALK PHOS: 88 U/L (ref 38–126)
ALT: 14 U/L — AB (ref 17–63)
ALT: 12 U/L — AB (ref 17–63)
AST: 21 U/L (ref 15–41)
AST: 21 U/L (ref 15–41)
Albumin: 3.7 g/dL (ref 3.5–5.0)
Albumin: 3.5 g/dL (ref 3.5–5.0)
Alkaline Phosphatase: 81 U/L (ref 38–126)
Anion gap: 6 (ref 5–15)
Anion gap: 6 (ref 5–15)
BILIRUBIN TOTAL: 0.7 mg/dL (ref 0.3–1.2)
BILIRUBIN TOTAL: 0.6 mg/dL (ref 0.3–1.2)
BUN: 15 mg/dL (ref 6–20)
BUN: 12 mg/dL (ref 6–20)
CALCIUM: 8.3 mg/dL — AB (ref 8.9–10.3)
CHLORIDE: 108 mmol/L (ref 101–111)
CO2: 27 mmol/L (ref 22–32)
CO2: 26 mmol/L (ref 22–32)
CREATININE: 0.9 mg/dL (ref 0.61–1.24)
CREATININE: 0.96 mg/dL (ref 0.61–1.24)
Calcium: 8.8 mg/dL — ABNORMAL LOW (ref 8.9–10.3)
Chloride: 106 mmol/L (ref 101–111)
GFR calc Af Amer: >60 mL/min (ref >60)
GFR calc Af Amer: >60 mL/min (ref >60)
Glucose, Bld: 128 mg/dL — ABNORMAL HIGH (ref 65–99)
Glucose, Bld: 117 mg/dL — ABNORMAL HIGH (ref 65–99)
Potassium: 3.7 mmol/L (ref 3.5–5.1)
Potassium: 4.3 mmol/L (ref 3.5–5.1)
Sodium: 139 mmol/L (ref 135–145)
Sodium: 140 mmol/L (ref 135–145)
TOTAL PROTEIN: 5.7 g/dL — AB (ref 6.5–8.1)
Total Protein: 5.5 g/dL — ABNORMAL LOW (ref 6.5–8.1)

## 2016-09-05 LAB — CBC
HCT: 32.8 % — ABNORMAL LOW (ref 39.0–52.0)
Hemoglobin: 10.5 g/dL — ABNORMAL LOW (ref 13.0–17.0)
MCH: 28.3 pg (ref 26.0–34.0)
MCHC: 32 g/dL (ref 30.0–36.0)
MCV: 88.4 fL (ref 78.0–100.0)
PLATELETS: 142 10*3/uL — AB (ref 150–400)
RBC: 3.71 MIL/uL — ABNORMAL LOW (ref 4.22–5.81)
RDW: 13.9 % (ref 11.5–15.5)
WBC: 5.3 10*3/uL (ref 4.0–10.5)

## 2016-09-05 LAB — PHOSPHORUS: Phosphorus: 2.5 mg/dL (ref 2.5–4.6)

## 2016-09-05 LAB — ECHOCARDIOGRAM COMPLETE
Height: 60 in
Weight: 1603.2 oz

## 2016-09-05 LAB — TROPONIN I
Troponin I: <0.03 ng/mL (ref <0.03)
Troponin I: <0.03 ng/mL (ref <0.03)

## 2016-09-05 LAB — MAGNESIUM: MAGNESIUM: 1.9 mg/dL (ref 1.7–2.4)

## 2016-09-05 LAB — GLUCOSE, CAPILLARY: Glucose-Capillary: 110 mg/dL — ABNORMAL HIGH (ref 65–99)

## 2016-09-05 MED ORDER — ENSURE ENLIVE PO LIQD
237.0000 mL | Freq: Two times a day (BID) | ORAL | Status: DC
  Administered 2016-09-05 – 2016-09-06 (×2): 237 mL via ORAL

## 2016-09-05 NOTE — Progress Notes (Signed)
Pt ambulated in the hallway with walker. Son at the bedside. HR remained in the 70s.

## 2016-09-05 NOTE — Progress Notes (Signed)
PROGRESS NOTE    Edward [name]  [id number] DOB: 10-16-1930 DOA: 09/04/2016 PCP: Sheela Stack, MD    Brief Narrative:  This is a 82 yo male with mild to moderate AS, who presents with weakness and episode of near syncope, complicated by bradycardia. 24 hours of weakness, intermittent loose stools. HR in the 40's with blood pressure [id number] systolic. Admitted for further workup, follow on echocardiogram.    Assessment & Plan:   Principal Problem:   Near syncope Active Problems:   HLD (hyperlipidemia)   Aortic valve disorder   COPD (chronic obstructive pulmonary disease) (HCC)   Chronic sinus bradycardia   Essential hypertension   General weakness   1. Near syncope. Will continue telemetry monitor. Will follow on echocardiogram. Physical therapy evaluation.   2. Bradycardia. ekg personally reviewed with sinus bradycardia and right bundle branch block. Will continue telemetry monitoring, b blocker has been discontinued.   3. Aortic stenosis. Last echocardiogram stenosis mild to moderate, patient with no angina or heart failure symptoms, will follow on repeat echocardiogram.  4. Dyslipidemia. Continue statin therapy  5. HTN. Continue blood pressure control. Continue ramipril. Blood pressure systolic [id number].   6. COPD. Continue oxymetry monitoring and supplemental 02 per [place] as needed.  7. CAD. No chest pain. Continue plavix, cardiac enzymes negative.   DVT prophylaxis: scd  Code Status: full Family Communication: I spoke with family at the bedside and all questions were addressed. Disposition Plan: home  Consultants:   Cardiology   Procedures:    Antimicrobials:     Subjective: Patient out of bed to chair, no chest pain, dyspnea, no nausea or vomiting. No syncope or near-syncope, no palpitations.   Objective: Vitals:   09/04/16 2016 09/05/16 0005 09/05/16 0455 09/05/16 1023  BP: (!) 160/48 (!) 124/44 (!) 142/59 (!) 120/57  Pulse: (!) 52 (!) 47 (!) 59 (!) 54    Resp: 17 18 18    Temp: 97.6 F (36.4 C) 97.8 F (36.6 C) 97.6 F (36.4 C) 97.7 F (36.5 C)  TempSrc: Oral Oral Oral Oral  SpO2: 99% 96% 97% 98%  Weight: 45.9 kg (101 lb 3.2 oz)  45.5 kg (100 lb 3.2 oz)   Height: 5' (1.524 m)       Intake/Output Summary (Last 24 hours) at 09/05/16 1157 Last data filed at 09/05/16 0900  Gross per 24 hour  Intake              860 ml  Output              550 ml  Net              310 ml   Filed Weights   09/04/16 2016 09/05/16 0455  Weight: 45.9 kg (101 lb 3.2 oz) 45.5 kg (100 lb 3.2 oz)    Examination:  General exam: not in pain or dyspnea E ENT: no conjunctival pallor, oral mucosa moist.  Respiratory system: Clear to auscultation. Respiratory effort normal. Cardiovascular system: S1 & S2 heard, RRR. No JVD, , rubs, gallops or clicks. No pedal edema. Positive murmur at the left sternal border, systolic 3/6.  Gastrointestinal system: Abdomen is nondistended, soft and nontender. No organomegaly or masses felt. Normal bowel sounds heard. Central nervous system: Alert and oriented. No focal neurological deficits. Extremities: Symmetric 5 x 5 power. Skin: No rashes, lesions or ulcers   Data Reviewed: I have personally reviewed following labs and imaging studies  CBC:  Recent Labs Lab 09/04/16 1336 09/04/16 2044 09/05/16 0949  WBC 7.4 6.0 5.3  NEUTROABS  --  3.9  --   HGB 11.5* 10.5* 10.5*  HCT 37.1* 33.0* 32.8*  MCV 89.8 89.2 88.4  PLT 162 142* [id number]*   Basic Metabolic Panel:  Recent Labs Lab 09/04/16 1336 09/04/16 2044 09/05/16 0949  NA 138 139 140  K 4.6 3.7 4.3  CL 106 106 108  CO2 26 27 26   GLUCOSE 98 128* 117*  BUN 16 15 12   CREATININE 0.95 0.90 0.96  CALCIUM 8.8* 8.3* 8.8*  MG  --  1.9  --   PHOS  --  2.5  --    GFR: Estimated Creatinine Clearance: 37.5 mL/min (by C-G formula based on SCr of 0.96 mg/dL). Liver Function Tests:  Recent Labs Lab 09/04/16 1336 09/04/16 2044 09/05/16 0949  AST 24 21 21   ALT 15*  14* 12*  ALKPHOS 105 88 81  BILITOT 0.8 0.7 0.6  PROT 6.2* 5.7* 5.5*  ALBUMIN 4.1 3.7 3.5   No results for input(s): LIPASE, AMYLASE in the last 168 hours. No results for input(s): AMMONIA in the last 168 hours. Coagulation Profile: No results for input(s): INR, PROTIME in the last 168 hours. Cardiac Enzymes:  Recent Labs Lab 09/04/16 1336 09/04/16 2044 09/05/16 0223 09/05/16 0949  TROPONINI <0.03 <0.03 <0.03 <0.03   BNP (last 3 results) No results for input(s): PROBNP in the last 8760 hours. HbA1C: No results for input(s): HGBA1C in the last 72 hours. CBG:  Recent Labs Lab 09/04/16 1511 09/05/16 0844  GLUCAP 74 110*   Lipid Profile: No results for input(s): CHOL, HDL, LDLCALC, TRIG, CHOLHDL, LDLDIRECT in the last 72 hours. Thyroid Function Tests: No results for input(s): TSH, T4TOTAL, FREET4, T3FREE, THYROIDAB in the last 72 hours. Anemia Panel: No results for input(s): VITAMINB12, FOLATE, FERRITIN, TIBC, IRON, RETICCTPCT in the last 72 hours. Sepsis Labs: No results for input(s): PROCALCITON, LATICACIDVEN in the last 168 hours.  No results found for this or any previous visit (from the past 240 hour(s)).       Radiology Studies: Dg Chest Port 1 View  Result Date: 09/04/2016 CLINICAL DATA:  Diarrhea. EXAM: PORTABLE CHEST 1 VIEW COMPARISON:  12/01/2015 FINDINGS: The lungs are clear wiithout focal pneumonia, edema, pneumothorax or pleural effusion. The cardiopericardial silhouette is within normal limits for size. The visualized bony structures of the thorax are intact. Telemetry leads overlie the chest. IMPRESSION: No active disease. Electronically Signed   By: Misty Stanley M.D.   On: 09/04/2016 16:06        Scheduled Meds: . atorvastatin  40 mg Oral Daily  . clopidogrel  75 mg Oral Daily  . fluticasone  2 spray Each Nare Daily  . metoCLOPramide  5 mg Oral TID AC & HS  . mometasone-formoterol  2 puff Inhalation BID  . montelukast  10 mg Oral QHS  .  pantoprazole  40 mg Oral Daily  . ramipril  2.5 mg Oral Daily  . sertraline  25 mg Oral Daily  . sodium chloride flush  3 mL Intravenous Q12H   Continuous Infusions: . sodium chloride 10 mL/hr at 09/05/16 0001     LOS: 0 days        Mauricio Gerome Apley, MD [facility] Pager [phone]  If 7PM-7AM, please contact night-coverage [email or web address] Password [email or web address] 09/05/2016, 11:57 AM

## 2016-09-05 NOTE — Plan of Care (Signed)
Problem: Nutrition: Goal: Adequate nutrition will be maintained Outcome: Progressing Ensure ordered as supplementation, patient drank one entire Ensure in addition to his meals

## 2016-09-05 NOTE — Plan of Care (Signed)
Problem: Activity: Goal: Risk for activity intolerance will decrease Outcome: Progressing Walked in halls 3 x on day shift

## 2016-09-05 NOTE — Evaluation (Signed)
Physical Therapy Evaluation and Discharge Patient Details Name: Edward [name] MRN: [id number] DOB: 06-22-1930 Today's Date: 09/05/2016   History of Present Illness  82 y.o. male with a history of CAD s/p PCI, HTN, dyslipidemia, mild-mod AS, and previous TIA who presented to the ED after a presyncopal episode; +bradycardia and dehydration    Clinical Impression  Patient evaluated by Physical Therapy with no further acute PT needs identified. Patient's HR 54-78 with activity; SaO2 96% on room air while walking. PT is signing off. Thank you for this referral.     Follow Up Recommendations No PT follow up    Equipment Recommendations  None recommended by PT    Recommendations for Other Services       Precautions / Restrictions Precautions Precautions: Other (comment) Precaution Comments: monitor HR      Mobility  Bed Mobility                  Transfers Overall transfer level: Independent                  Ambulation/Gait Ambulation/Gait assistance: Supervision;Min guard Ambulation Distance (Feet): 200 Feet Assistive device: None Gait Pattern/deviations: WFL(Within Functional Limits) Gait velocity: able to vary up and down Gait velocity interpretation: at or above normal speed for age/gender    Stairs            Wheelchair Mobility    Modified Rankin (Stroke Patients Only)       Balance                                 Standardized Balance Assessment Standardized Balance Assessment : Dynamic Gait Index   Dynamic Gait Index Level Surface: Normal Change in Gait Speed: Normal Gait with Horizontal Head Turns: Normal Gait with Vertical Head Turns: Normal Gait and Pivot Turn: Normal Step Around Obstacles: Normal       Pertinent Vitals/Pain Pain Assessment: No/denies pain    Home Living Family/patient expects to be discharged to:: Private residence Living Arrangements: Spouse/significant other Available Help at Discharge:  Family;Available 24 hours/day Type of Home: House Home Access: Stairs to enter Entrance Stairs-Rails: None Entrance Stairs-Number of Steps: 3 Home Layout: One level Home Equipment: Walker - 2 wheels;Cane - single point;Other (comment);Shower seat      Prior Function Level of Independence: Independent         Comments: wife states when he first stands up, he sometimes puts his hand on the wall in hallway to steady himself     Hand Dominance   Dominant Hand: Right    Extremity/Trunk Assessment   Upper Extremity Assessment: Overall WFL for tasks assessed           Lower Extremity Assessment: Overall WFL for tasks assessed      Cervical / Trunk Assessment: Normal  Communication   Communication: HOH  Cognition Arousal/Alertness: Awake/alert Behavior During Therapy: WFL for tasks assessed/performed Overall Cognitive Status: Within Functional Limits for tasks assessed                      General Comments General comments (skin integrity, edema, etc.): Family present and report pt is at baseline with mobility. Able to sit and don his socks and shoes I'ly    Exercises     Assessment/Plan    PT Assessment Patent does not need any further PT services  PT Problem List  PT Treatment Interventions      PT Goals (Current goals can be found in the Care Plan section)  Acute Rehab PT Goals PT Goal Formulation: All assessment and education complete, DC therapy    Frequency     Barriers to discharge        Co-evaluation               End of Session Equipment Utilized During Treatment: Gait belt Activity Tolerance: Patient tolerated treatment well Patient left: in chair;with call bell/phone within reach;with chair alarm set;with family/visitor present      Functional Assessment Tool Used: clinical judgement Functional Limitation: Mobility: Walking and moving around Mobility: Walking and Moving Around Current Status [id number]): At least  1 percent but less than 20 percent impaired, limited or restricted Mobility: Walking and Moving Around Goal Status [phone]): At least 1 percent but less than 20 percent impaired, limited or restricted Mobility: Walking and Moving Around Discharge Status [phone]): At least 1 percent but less than 20 percent impaired, limited or restricted    Time: 1105-1120 PT Time Calculation (min) (ACUTE ONLY): 15 min   Charges:   PT Evaluation $PT Eval Low Complexity: 1 Procedure     PT G Codes:   PT G-Codes **NOT FOR INPATIENT CLASS** Functional Assessment Tool Used: clinical judgement Functional Limitation: Mobility: Walking and moving around Mobility: Walking and Moving Around Current Status [id number]): At least 1 percent but less than 20 percent impaired, limited or restricted Mobility: Walking and Moving Around Goal Status [phone]): At least 1 percent but less than 20 percent impaired, limited or restricted Mobility: Walking and Moving Around Discharge Status [phone]): At least 1 percent but less than 20 percent impaired, limited or restricted    Edward [name] 09/05/2016, 12:08 PM Pager [phone]

## 2016-09-05 NOTE — Progress Notes (Signed)
Patient Name: Edward [name] Date of Encounter: 09/05/2016  Primary Cardiologist: Darlina Guys MD  Hospital Problem List     Principal Problem:   Near syncope Active Problems:   HLD (hyperlipidemia)   Aortic valve disorder   COPD (chronic obstructive pulmonary disease) (HCC)   Chronic sinus bradycardia   Essential hypertension   General weakness     Subjective   Feels ok this am. Lying in bed. No chest pain or dyspnea.  Inpatient Medications    Scheduled Meds: . atorvastatin  40 mg Oral Daily  . clopidogrel  75 mg Oral Daily  . fluticasone  2 spray Each Nare Daily  . metoCLOPramide  5 mg Oral TID AC & HS  . metoprolol  12.5 mg Oral BID  . mometasone-formoterol  2 puff Inhalation BID  . montelukast  10 mg Oral QHS  . pantoprazole  40 mg Oral Daily  . ramipril  2.5 mg Oral Daily  . sertraline  25 mg Oral Daily  . sodium chloride flush  3 mL Intravenous Q12H   Continuous Infusions: . sodium chloride 10 mL/hr at 09/05/16 0001   PRN Meds: acetaminophen **OR** acetaminophen   Vital Signs    Vitals:   09/04/16 1930 09/04/16 2016 09/05/16 0005 09/05/16 0455  BP: 129/55 (!) 160/48 (!) 124/44 (!) 142/59  Pulse: (!) 53 (!) 52 (!) 47 (!) 59  Resp: 12 17 18 18   Temp:  97.6 F (36.4 C) 97.8 F (36.6 C) 97.6 F (36.4 C)  TempSrc:  Oral Oral Oral  SpO2: 99% 99% 96% 97%  Weight:  101 lb 3.2 oz (45.9 kg)  100 lb 3.2 oz (45.5 kg)  Height:  5' (1.524 m)      Intake/Output Summary (Last 24 hours) at 09/05/16 0919 Last data filed at 09/05/16 0728  Gross per 24 hour  Intake              740 ml  Output              550 ml  Net              190 ml   Filed Weights   09/04/16 2016 09/05/16 0455  Weight: 101 lb 3.2 oz (45.9 kg) 100 lb 3.2 oz (45.5 kg)    Physical Exam    GEN: Elderly, chronically ill appearing, in no acute distress.  HEENT: Grossly normal.  Neck: Supple, no JVD, carotid bruits, or masses. Cardiac: RRR, gr 2/6 systolic murmur RUSB>> LSB.  No  clubbing, cyanosis, edema.  Radials/DP/PT 2+ and equal bilaterally.  Respiratory:  Respirations regular and unlabored, clear to auscultation bilaterally. GI: Soft, nontender, nondistended, BS + x 4. MS: no deformity or atrophy. Skin: warm and dry, no rash. Neuro:  Strength and sensation are intact. Psych: AAOx3.  Normal affect.  Labs    CBC  Recent Labs  09/04/16 1336 09/04/16 2044  WBC 7.4 6.0  NEUTROABS  --  3.9  HGB 11.5* 10.5*  HCT 37.1* 33.0*  MCV 89.8 89.2  PLT 162 [id number]*   Basic Metabolic Panel  Recent Labs  09/04/16 1336 09/04/16 2044  NA 138 139  K 4.6 3.7  CL 106 106  CO2 26 27  GLUCOSE 98 128*  BUN 16 15  CREATININE 0.95 0.90  CALCIUM 8.8* 8.3*  MG  --  1.9  PHOS  --  2.5   Liver Function Tests  Recent Labs  09/04/16 1336 09/04/16 2044  AST 24 21  ALT  15* 14*  ALKPHOS 105 88  BILITOT 0.8 0.7  PROT 6.2* 5.7*  ALBUMIN 4.1 3.7   No results for input(s): LIPASE, AMYLASE in the last 72 hours. Cardiac Enzymes  Recent Labs  09/04/16 1336 09/04/16 2044 09/05/16 0223  TROPONINI <0.03 <0.03 <0.03   BNP Invalid input(s): POCBNP D-Dimer No results for input(s): DDIMER in the last 72 hours. Hemoglobin A1C No results for input(s): HGBA1C in the last 72 hours. Fasting Lipid Panel No results for input(s): CHOL, HDL, LDLCALC, TRIG, CHOLHDL, LDLDIRECT in the last 72 hours. Thyroid Function Tests No results for input(s): TSH, T4TOTAL, T3FREE, THYROIDAB in the last 72 hours.  Invalid input(s): FREET3  Telemetry    NSR, sinus brady into 40s last night.  - Personally Reviewed  ECG    Sinus brady with first degree AV block, RBBB - Personally Reviewed  Radiology    Dg Chest Port 1 View  Result Date: 09/04/2016 CLINICAL DATA:  Diarrhea. EXAM: PORTABLE CHEST 1 VIEW COMPARISON:  12/01/2015 FINDINGS: The lungs are clear wiithout focal pneumonia, edema, pneumothorax or pleural effusion. The cardiopericardial silhouette is within normal limits for  size. The visualized bony structures of the thorax are intact. Telemetry leads overlie the chest. IMPRESSION: No active disease. Electronically Signed   By: Misty Stanley M.D.   On: 09/04/2016 16:06    Cardiac Studies   Echo is pending  Patient Profile     Edward [name] is a 82 y.o. male with a history of CAD s/p PCI, HTN, dyslipidemia, mild-mod AS, and previous TIA who presented to the ED after a presyncopal episode.   Assessment & Plan    1. Presyncope. Most likely related to volume depletion with loose stools and poor po intake. Also component of bradycardia with rates in 40s. Doubt significant AS based on exam and prior Echo. Will repeat Echo. DC metoprolol. Check orthostatics. Observe with increased activity. 2. CAD- asymptomatic. Remote stents. Continue Plavix and statin 3. HTN. Observe off beta blocker. If additional therapy needed could increase ramipril. 4. HL on statin.  Signed, Valeria Krisko [place], MD  09/05/2016, 9:19 AM

## 2016-09-06 DIAGNOSIS — I359 Nonrheumatic aortic valve disorder, unspecified: Secondary | ICD-10-CM | POA: Diagnosis not present

## 2016-09-06 DIAGNOSIS — I10 Essential (primary) hypertension: Secondary | ICD-10-CM | POA: Diagnosis not present

## 2016-09-06 DIAGNOSIS — R55 Syncope and collapse: Secondary | ICD-10-CM | POA: Diagnosis not present

## 2016-09-06 LAB — GLUCOSE, CAPILLARY: GLUCOSE-CAPILLARY: 97 mg/dL (ref 65–99)

## 2016-09-06 NOTE — Consult Note (Signed)
   [facility] CM Inpatient Consult   09/06/2016  Edward [name] 01/04/1930 [id number]  Patient screened for potential [place] Management services. Patient is eligible for [facility] Care Management services under patient's Medicare plan.  Chart reviewed for [facility] Care Management he was admitted under observations for syncope. Discussed in progression meeting earlier and for syncope. HX of COPD. Patient will be followed for COPD EMMI calls by inpatient RNCM. No current [facility] Care Management services at this time for community follow up.   For questions contact:   Natividad Brood, RN BSN [facility] Liaison  [phone] business mobile phone Toll free office [phone]

## 2016-09-06 NOTE — Discharge Summary (Signed)
Physician Discharge Summary  Edward [name] [id number] DOB: [birth year] DOA: 09/04/2016  PCP: Sheela Stack, MD  Admit date: 09/04/2016 Discharge date: 09/06/2016  Admitted From:  Home  Disposition:  Home   Recommendations for Outpatient Follow-up:  1. Follow up with PCP in 1- weeks 2. Metoprolol has been discontinued.  Home Health: No   Equipment/Devices: walker  Discharge Condition: Stable  CODE STATUS:Full   Diet recommendation: Heart Healthy   Brief/Interim Summary: This is a 82 year old gentleman with a history of aortic stenosis and coronary disease who presents with the chief complaint of near syncope episode. Video admission he felt significant weakness, intermittent diarrhea. He suffered a near syncopal episode, which was witnessed by his wife. No frank loss of consciousness. Apparently he is metoprolol have been reduced in dose on the recent follow-up US an outpatient. On initial physical examination his blood pressure was 152/66, heart rate 46, respiratory 12, oxygen saturation 100%. His mucous membranes were moist, his lungs were clear to auscultation bilaterally, heart S1-S2 present bradycardic, positive murmur, 3/ 6 systolic, abdomen soft and nontender, no lower extremity edema. His sodium was 139, potassium 3.7, creatinine 0.90, BUN 15, glucose 128, white count 6.0, Hb 10.5, hematocrit 33.0, platelet 142. His EKG was sinus bradycardia with a first-degree AV block, right bundle branch block. His chest x-ray was negative for infiltrates.  Patient was admitted to the hospital with a diagnosis of near syncope complicated by sinus bradycardia.  1. Near syncope. It was determined to be multifactorial, patient's beta blockade was held, patient was continued on a telemetry monitor with no significant arrhythmias. Patient was able to ambulate without major difficulty.  2. Sinus bradycardia. Heart rate improved off beta blockade. We'll continue to hold AV blocking  agents.  3. Aortic stenosis. Patient had an echocardiogram which showed left ventricular ejection fraction 60- 65%. Increased wall thickness with a pattern of mild LVH. The aortic valve had mild to moderate stenosis.   4. Hypertension. Patient's blood pressure remained well-controlled with ramipril.   5. COPD. Remain stableand no signs of acute exacerbation.  6. Coronary artery disease. Patient was continued on Plavix. Cardiac enzymes were negative.  Discharge Diagnoses:  Principal Problem:   Near syncope Active Problems:   HLD (hyperlipidemia)   Aortic valve disorder   COPD (chronic obstructive pulmonary disease) (HCC)   Chronic sinus bradycardia   Essential hypertension   General weakness    Discharge Instructions  Discharge Instructions    Diet - low sodium heart healthy    Complete by:  As directed    Discharge instructions    Complete by:  As directed    Please follow with primary care in 7 days.   Increase activity slowly    Complete by:  As directed        Medication List    STOP taking these medications   metoprolol 50 MG tablet Commonly known as:  LOPRESSOR     TAKE these medications   ADVAIR DISKUS 250-50 MCG/DOSE Aepb Generic drug:  Fluticasone-Salmeterol Inhale 1 puff into the lungs every 12 (twelve) hours.   atorvastatin 40 MG tablet Commonly known as:  LIPITOR Take 40 mg by mouth daily.   clopidogrel 75 MG tablet Commonly known as:  PLAVIX TAKE 1 TABLET (75 MG TOTAL) BY MOUTH DAILY.   metoCLOPramide 5 MG tablet Commonly known as:  REGLAN Take 1 tablet (5 mg total) by mouth 4 (four) times daily -  before meals and at bedtime.   mometasone 50 MCG/ACT  nasal spray Commonly known as:  NASONEX Place 2 sprays into the nose daily.   pantoprazole 40 MG tablet Commonly known as:  PROTONIX Take 40 mg by mouth daily.   ramipril 2.5 MG capsule Commonly known as:  ALTACE Take 2.5 mg by mouth daily.   sertraline 25 MG tablet Commonly known as:   ZOLOFT Take 25 mg by mouth daily.   triamcinolone cream 0.1 % Commonly known as:  KENALOG Apply 1 application topically 2 (two) times daily.   zafirlukast 20 MG tablet Commonly known as:  ACCOLATE Take 20 mg by mouth 2 (two) times daily before a meal.      Follow-up Information    Sheela Stack, MD Follow up in 1 week(s).   Specialty:  Endocrinology Contact information: [place] [address] [phone]          Allergies  Allergen Reactions  . Aspirin Rash    Consultations:  Cardiology   Procedures/Studies: Dg Chest Port 1 View  Result Date: 09/04/2016 CLINICAL DATA:  Diarrhea. EXAM: PORTABLE CHEST 1 VIEW COMPARISON:  12/01/2015 FINDINGS: The lungs are clear wiithout focal pneumonia, edema, pneumothorax or pleural effusion. The cardiopericardial silhouette is within normal limits for size. The visualized bony structures of the thorax are intact. Telemetry leads overlie the chest. IMPRESSION: No active disease. Electronically Signed   By: Misty Stanley M.D.   On: 09/04/2016 16:06       Subjective: Patient feeling well, no chest pain or dyspnea, no palpitations. No dizziness. Out of bed ambulating.   Discharge Exam: Vitals:   09/06/16 0606 09/06/16 0927  BP: (!) 149/80 (!) 124/46  Pulse: 78 74  Resp: 17 18  Temp: 97.5 F (36.4 C) 97.9 F (36.6 C)   Vitals:   09/06/16 0148 09/06/16 0606 09/06/16 0855 09/06/16 0927  BP: (!) 138/45 (!) 149/80  (!) 124/46  Pulse: (!) 57 78  74  Resp: 16 17  18   Temp: 97.6 F (36.4 C) 97.5 F (36.4 C)  97.9 F (36.6 C)  TempSrc: Oral Oral  Oral  SpO2: 96% 100% 97% 97%  Weight:  45.6 kg (100 lb 8 oz)    Height:        General: Pt is alert, awake, not in acute distress Cardiovascular: RRR, S1/S2 +, no rubs, no gallops. psitive murmur 3/6 at the right sternal border. Respiratory: CTA bilaterally, no wheezing, no rhonchi Abdominal: Soft, NT, ND, bowel sounds + Extremities: no edema, no  cyanosis    The results of significant diagnostics from this hospitalization (including imaging, microbiology, ancillary and laboratory) are listed below for reference.     Microbiology: No results found for this or any previous visit (from the past 240 hour(s)).   Labs: BNP (last 3 results) No results for input(s): BNP in the last 8760 hours. Basic Metabolic Panel:  Recent Labs Lab 09/04/16 1336 09/04/16 2044 09/05/16 0949  NA 138 139 140  K 4.6 3.7 4.3  CL 106 106 108  CO2 26 27 26   GLUCOSE 98 128* 117*  BUN 16 15 12   CREATININE 0.95 0.90 0.96  CALCIUM 8.8* 8.3* 8.8*  MG  --  1.9  --   PHOS  --  2.5  --    Liver Function Tests:  Recent Labs Lab 09/04/16 1336 09/04/16 2044 09/05/16 0949  AST 24 21 21   ALT 15* 14* 12*  ALKPHOS 105 88 81  BILITOT 0.8 0.7 0.6  PROT 6.2* 5.7* 5.5*  ALBUMIN 4.1 3.7 3.5  No results for input(s): LIPASE, AMYLASE in the last 168 hours. No results for input(s): AMMONIA in the last 168 hours. CBC:  Recent Labs Lab 09/04/16 1336 09/04/16 2044 09/05/16 0949  WBC 7.4 6.0 5.3  NEUTROABS  --  3.9  --   HGB 11.5* 10.5* 10.5*  HCT 37.1* 33.0* 32.8*  MCV 89.8 89.2 88.4  PLT 162 142* 142*   Cardiac Enzymes:  Recent Labs Lab 09/04/16 1336 09/04/16 2044 09/05/16 0223 09/05/16 0949  TROPONINI <0.03 <0.03 <0.03 <0.03   BNP: Invalid input(s): POCBNP CBG:  Recent Labs Lab 09/04/16 1511 09/05/16 0844 09/06/16 0759  GLUCAP 74 110* 97   D-Dimer No results for input(s): DDIMER in the last 72 hours. Hgb A1c No results for input(s): HGBA1C in the last 72 hours. Lipid Profile No results for input(s): CHOL, HDL, LDLCALC, TRIG, CHOLHDL, LDLDIRECT in the last 72 hours. Thyroid function studies No results for input(s): TSH, T4TOTAL, T3FREE, THYROIDAB in the last 72 hours.  Invalid input(s): FREET3 Anemia work up No results for input(s): VITAMINB12, FOLATE, FERRITIN, TIBC, IRON, RETICCTPCT in the last 72 hours. Urinalysis     Component Value Date/Time   COLORURINE YELLOW 09/04/2016 [place] 09/04/2016 1653   LABSPEC 1.019 09/04/2016 1653   PHURINE 6.0 09/04/2016 1653   GLUCOSEU NEGATIVE 09/04/2016 1653   HGBUR NEGATIVE 09/04/2016 1653   BILIRUBINUR NEGATIVE 09/04/2016 1653   KETONESUR NEGATIVE 09/04/2016 1653   PROTEINUR NEGATIVE 09/04/2016 1653   UROBILINOGEN 1.0 01/24/2012 1216   NITRITE NEGATIVE 09/04/2016 1653   LEUKOCYTESUR NEGATIVE 09/04/2016 1653   Sepsis Labs Invalid input(s): PROCALCITONIN,  WBC,  LACTICIDVEN Microbiology No results found for this or any previous visit (from the past 240 hour(s)).   Time coordinating discharge: 45 minutes  SIGNED:   Tawni Millers, MD  [facility] 09/06/2016, 11:19 AM Pager   If 7PM-7AM, please contact night-coverage [email or web address] Password [email or web address]

## 2016-09-06 NOTE — Progress Notes (Signed)
Patient Name: Edward [name] Date of Encounter: 09/06/2016  Primary Cardiologist: [facility] Problem List     Principal Problem:   Near syncope Active Problems:   HLD (hyperlipidemia)   Aortic valve disorder   COPD (chronic obstructive pulmonary disease) (HCC)   Chronic sinus bradycardia   Essential hypertension   General weakness    Subjective   No chest pain or dyspnea. No recurrent dizziness.   Inpatient Medications    Scheduled Meds: . atorvastatin  40 mg Oral Daily  . clopidogrel  75 mg Oral Daily  . feeding supplement (ENSURE ENLIVE)  237 mL Oral BID BM  . fluticasone  2 spray Each Nare Daily  . metoCLOPramide  5 mg Oral TID AC & HS  . mometasone-formoterol  2 puff Inhalation BID  . montelukast  10 mg Oral QHS  . pantoprazole  40 mg Oral Daily  . ramipril  2.5 mg Oral Daily  . sertraline  25 mg Oral Daily  . sodium chloride flush  3 mL Intravenous Q12H   Continuous Infusions: . sodium chloride 10 mL/hr at 09/05/16 0001   PRN Meds: acetaminophen **OR** acetaminophen   Vital Signs    Vitals:   09/05/16 1202 09/05/16 2139 09/06/16 0148 09/06/16 0606  BP: (!) 134/42 (!) 129/44 (!) 138/45 (!) 149/80  Pulse: (!) 53 (!) 58 (!) 57 78  Resp: 18 17 16 17   Temp: 97.7 F (36.5 C) 97.9 F (36.6 C) 97.6 F (36.4 C) 97.5 F (36.4 C)  TempSrc: Oral Oral Oral Oral  SpO2: 98% 98% 96% 100%  Weight:    100 lb 8 oz (45.6 kg)  Height:        Intake/Output Summary (Last 24 hours) at 09/06/16 0806 Last data filed at 09/06/16 0606  Gross per 24 hour  Intake              540 ml  Output              500 ml  Net               40 ml   Filed Weights   09/04/16 2016 09/05/16 0455 09/06/16 0606  Weight: 101 lb 3.2 oz (45.9 kg) 100 lb 3.2 oz (45.5 kg) 100 lb 8 oz (45.6 kg)    Physical Exam   GEN: thin male. NAD.   HEENT: Grossly normal.  Neck: Supple, no JVD, carotid bruits, or masses. Cardiac: RRR, soft systolic murmur. No rubs, or gallops. No clubbing,  cyanosis, edema.  Radials/DP/PT 2+ and equal bilaterally.  Respiratory:  Respirations regular and unlabored, clear to auscultation bilaterally. GI: Soft, nontender, nondistended, BS + x 4. MS: no deformity or atrophy. Skin: warm and dry, no rash. Neuro:  Strength and sensation are intact. Psych: AAOx3.  Normal affect.  Labs    CBC  Recent Labs  09/04/16 2044 09/05/16 0949  WBC 6.0 5.3  NEUTROABS 3.9  --   HGB 10.5* 10.5*  HCT 33.0* 32.8*  MCV 89.2 88.4  PLT 142* [id number]*   Basic Metabolic Panel  Recent Labs  09/04/16 2044 09/05/16 0949  NA 139 140  K 3.7 4.3  CL 106 108  CO2 27 26  GLUCOSE 128* 117*  BUN 15 12  CREATININE 0.90 0.96  CALCIUM 8.3* 8.8*  MG 1.9  --   PHOS 2.5  --    Liver Function Tests  Recent Labs  09/04/16 2044 09/05/16 0949  AST 21 21  ALT 14* 12*  ALKPHOS 88 81  BILITOT 0.7 0.6  PROT 5.7* 5.5*  ALBUMIN 3.7 3.5   Cardiac Enzymes  Recent Labs  09/04/16 2044 09/05/16 0223 09/05/16 0949  TROPONINI <0.03 <0.03 <0.03    Telemetry    Sinus, PVCs - Personally Reviewed  ECG    Radiology    Dg Chest Port 1 View  Result Date: 09/04/2016 CLINICAL DATA:  Diarrhea. EXAM: PORTABLE CHEST 1 VIEW COMPARISON:  12/01/2015 FINDINGS: The lungs are clear wiithout focal pneumonia, edema, pneumothorax or pleural effusion. The cardiopericardial silhouette is within normal limits for size. The visualized bony structures of the thorax are intact. Telemetry leads overlie the chest. IMPRESSION: No active disease. Electronically Signed   By: Misty Stanley M.D.   On: 09/04/2016 16:06    Cardiac Studies   Echo 09/05/16: Left ventricle: The cavity size was normal. Wall thickness was   increased in a pattern of mild LVH. Systolic function was normal.   The estimated ejection fraction was in the range of 60% to 65%.   Wall motion was normal; there were no regional wall motion   abnormalities. Features are consistent with a pseudonormal left    ventricular filling pattern, with concomitant abnormal relaxation   and increased filling pressure (grade 2 diastolic dysfunction).   Doppler parameters are consistent with high ventricular filling   pressure. - Aortic valve: Moderately to severely calcified annulus. Mildly   thickened, moderately calcified leaflets. There was mild to   moderate stenosis. There was mild regurgitation. Peak velocity   (S): 259 cm/s. Mean gradient (S): 14 mm Hg. Valve area (VTI):   1.29 cm^2. Valve area (Vmax): 1.32 cm^2. Valve area (Vmean): 1.25   cm^2. - Mitral valve: Mildly calcified annulus. Normal thickness leaflets   . There was moderate regurgitation. - Left atrium: The atrium was severely dilated. - Right atrium: The atrium was mildly dilated. - Tricuspid valve: There was mild regurgitation.  Patient Profile       Assessment & Plan    1. Pre-syncope: He has chronic sinus brady on beta blockers (looking back over last 2 years). Beta blocker recently reduced. His dizziness is felt to be a combination of bradycardia and volume depletion with loose stools and poor po intake. Echo with normal LV systolic function and only moderate AS. Beta blocker has been stopped. Would not resume.   2. CAD: No chest pain. Troponin negative. He has remote stents. Continue Plavix and statin  3. HTN. Observe off beta blocker. If additional therapy needed could increase ramipril.  4. HLD: He is on a statin.  He may be able to be discharged today.   Signed, Lauree Chandler, MD  09/06/2016, 8:06 AM

## 2016-09-27 ENCOUNTER — Other Ambulatory Visit: Payer: Self-pay

## 2016-09-27 NOTE — Patient Outreach (Signed)
[facility]) Care Management  09/27/2016  Edward [name] 09-Nov-1930 [id number]  REFERRAL DATE: 09/27/16 REFERRAL SOURCE: EMMI copd red alert REFERRAL REASON:  Mucus color change: YES # of times inhaler used in 24 hours: 3  Telephone call to patient regarding EMMI copd RED alert. Unable to reach patient. HIPAA compliant voice message left with call back phone number.  PLAN: RNCM will attempt 2nd telephone call to patient within 1 week.   Quinn Plowman RN,BSN,CCM [facility] Telephonic  [phone]

## 2016-09-27 NOTE — Patient Outreach (Signed)
[facility]) Care Management  09/27/2016  LYNDON LOOS Mar 09, 1930 [id number]   Received incoming call from person identifying themselves as Mrs. Resler, patients wife.  Caller requested reason for initial call. RNCM informed caller due to HIPAA reasons would only be able to speak with Mr. Gondek.   Caller asked 2 more times what call was in reference to.  RNCM informed caller due to HIPAA reasons would only be able to speak with Mr. Gotay. Caller hung up the phone.   Quinn Plowman RN,BSN,CCM [facility] Telephonic  [phone]

## 2016-09-28 ENCOUNTER — Other Ambulatory Visit: Payer: Self-pay

## 2016-09-28 NOTE — Patient Outreach (Addendum)
[place] [facility]) Care Management  09/28/2016  Edward [name] 1929-12-01 [id number]     EMMI-COPD RED ON EMMI ALERT Day # 11 Date:09/24/16 Red Alert Reason: "Mucus change color? Yes   3 of times rescue inhaler used in past 24 hrs? 3"   Outreach attempt #2 to patient. No answer at present. RN CM left HIPAA compliant voicemail message along with contact info.    Plan: RN CM will send unsuccessful outreach letter to patient and close case if no response from patient within 10 business days.  Enzo Montgomery, RN,BSN,CCM [facility] Telephonic Care Management Coordinator Direct Phone: [phone] Toll Free: [phone] Fax: [phone]

## 2016-10-12 ENCOUNTER — Other Ambulatory Visit: Payer: Self-pay

## 2016-10-12 NOTE — Patient Outreach (Signed)
[place] [facility]) Care Management  10/12/2016  Edward [name] 1930-06-20 [id number]     EMMI-COPD RED ON EMMI ALERT Day # 11 Date:09/24/16 Red Alert Reason: "Mucus change color? Yes   3 of times rescue inhaler used in past 24 hrs? 3"    Multiple attempts to establish contact with patient without success. No response from letter mailed to patient. Case is being closed at this time.    Plan: RN CM will notify [facility] administrative assistant of case closure status.   Enzo Montgomery, RN,BSN,CCM [facility] Telephonic Care Management Coordinator Direct Phone: [phone] Toll Free: [phone] Fax: [phone]

## 2016-12-10 ENCOUNTER — Other Ambulatory Visit: Payer: Self-pay | Admitting: Cardiovascular Disease

## 2016-12-18 IMAGING — RF DG ESOPHAGUS
6 of 7 series · 12 of 17 positions shown · non-contrast
Comparison: CT chest 09/25/2016

CLINICAL DATA: Difficulty swallowing.  Food stuck in throat.

EXAM:
ESOPHOGRAM/BARIUM SWALLOW
TECHNIQUE: Single contrast examination was performed using  thin barium..
FLUOROSCOPY TIME:  Radiation Exposure Index (as provided by the
fluoroscopic device):
If the device does not provide the exposure index:
Fluoroscopy Time:  48 seconds
Number of Acquired Images:  6

[Series 1: fluoro_barium 2fps_bw · 0.19mm/px · 1 of 2 frames shown (1 of 4)]
[frame 1/2]
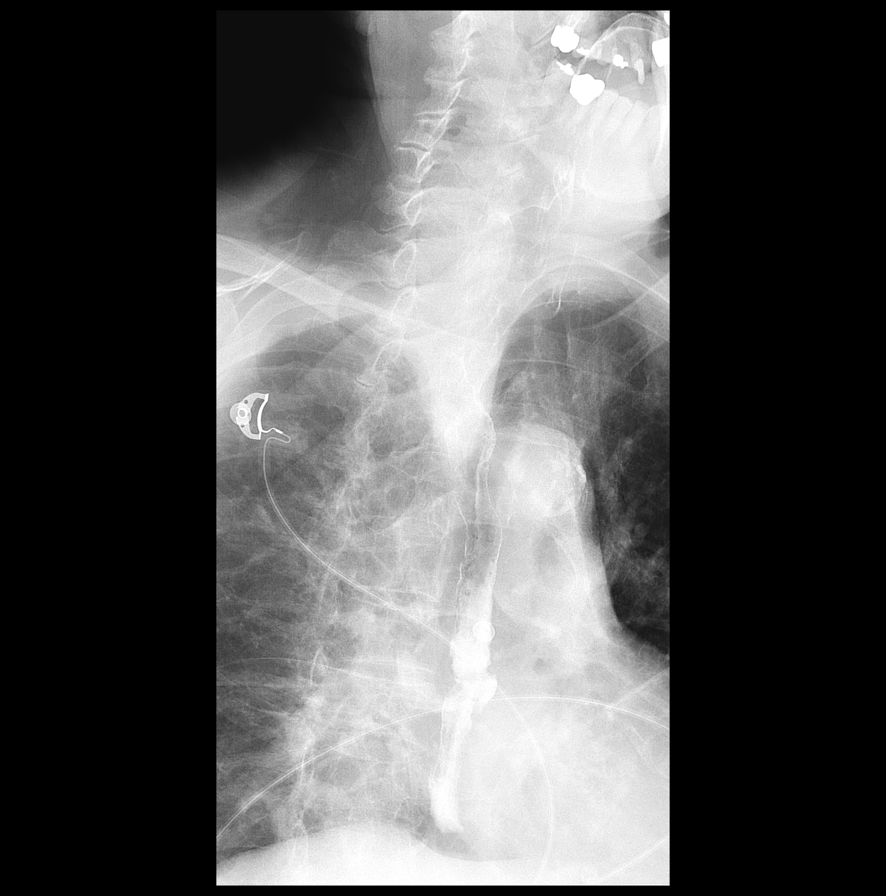

[Series 2: cp_standard · 0.58mm/px · 3 of 30 frames shown (1 of 2)]
[frame 5/30]
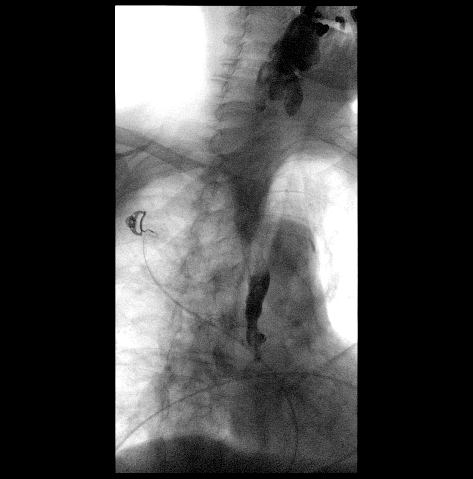
[frame 16/30]
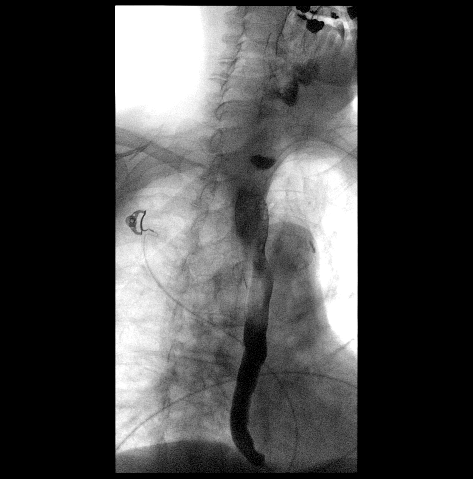
[frame 26/30]
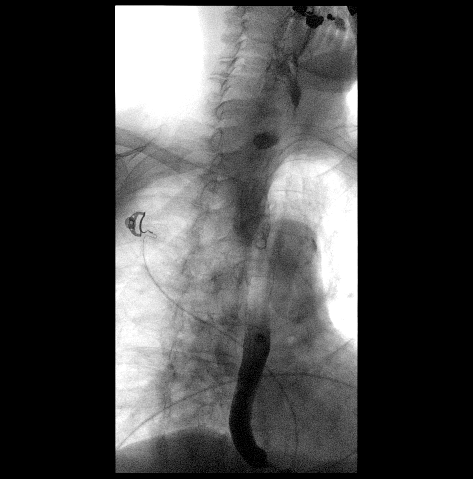

[Series 3: fluoro_barium 2fps_bw · 0.19mm/px · 2 of 2 frames shown (2 of 4)]
[frame 1/2]
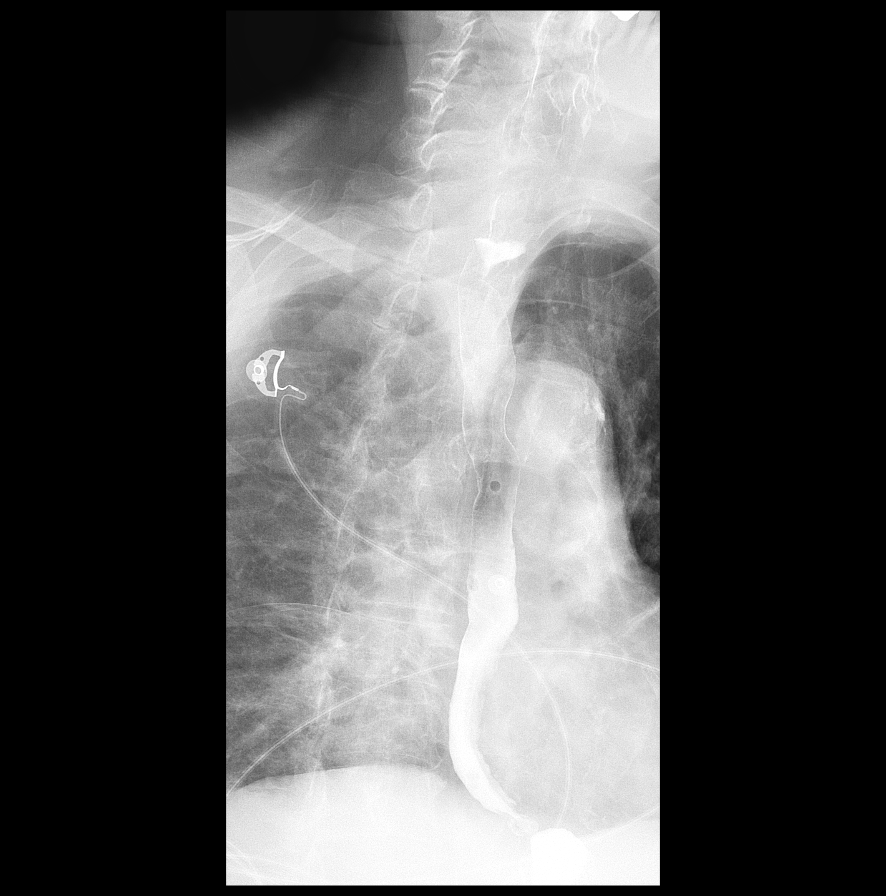
[frame 2/2]
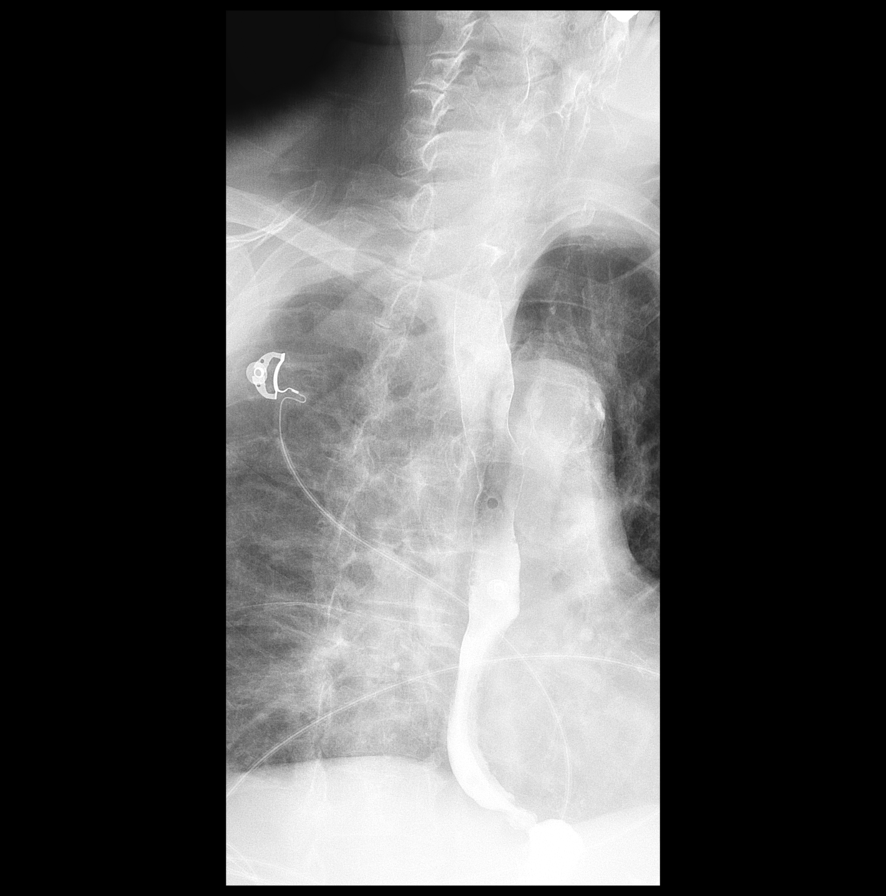

[Series 5: cp_standard · 0.39mm/px · 3 of 68 frames shown (2 of 2)]
[frame 11/68]
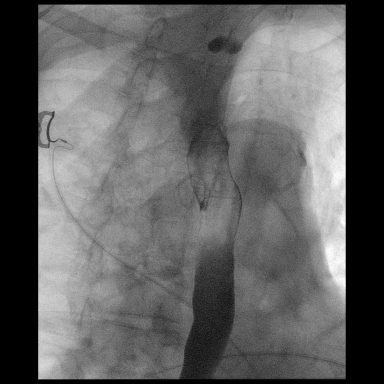
[frame 20/68]
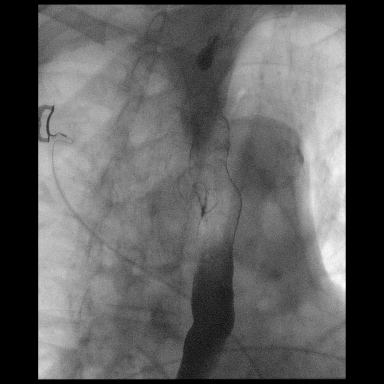
[frame 58/68]
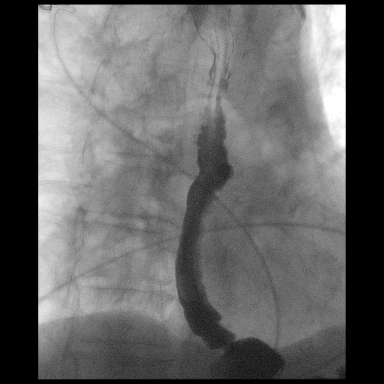

[Series 6: fluoro_barium 2fps_bw · 0.19mm/px · 2 of 2 frames shown (3 of 4)]
[frame 1/2]
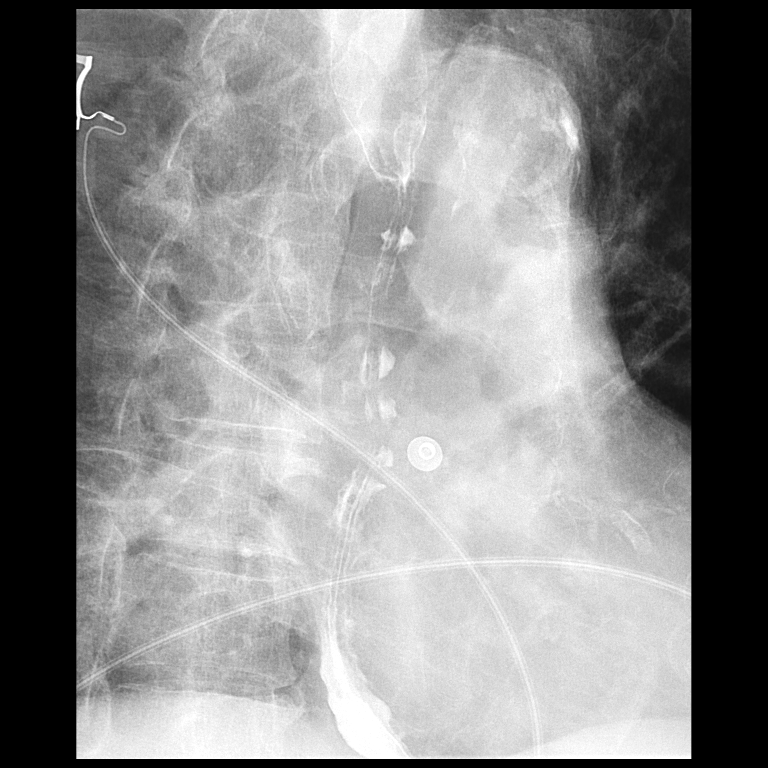
[frame 2/2]
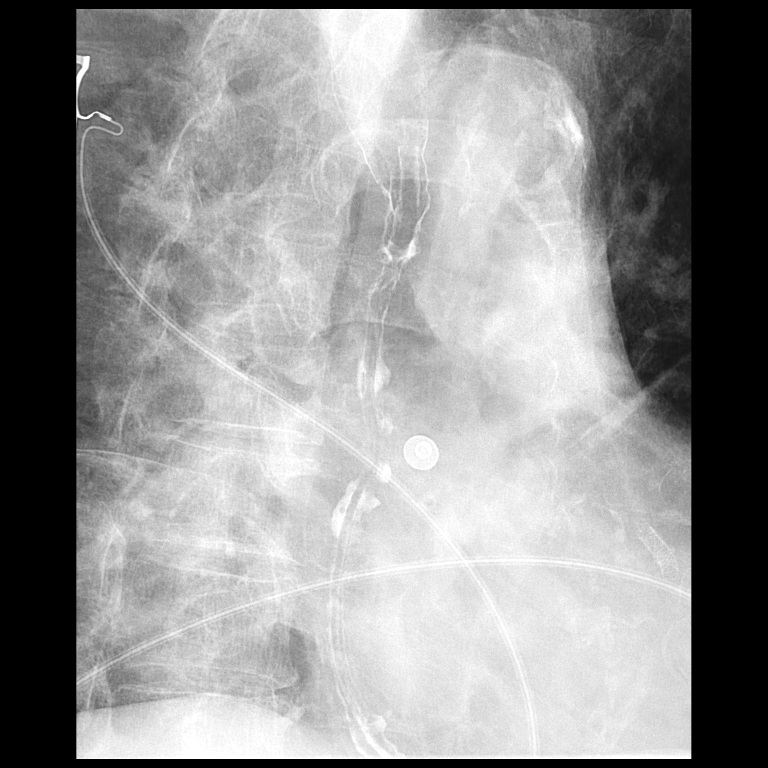

[Series 7: fluoro_barium 2fps_bw · 0.20mm/px · 1 of 2 frames shown (4 of 4)]
[frame 2/2]
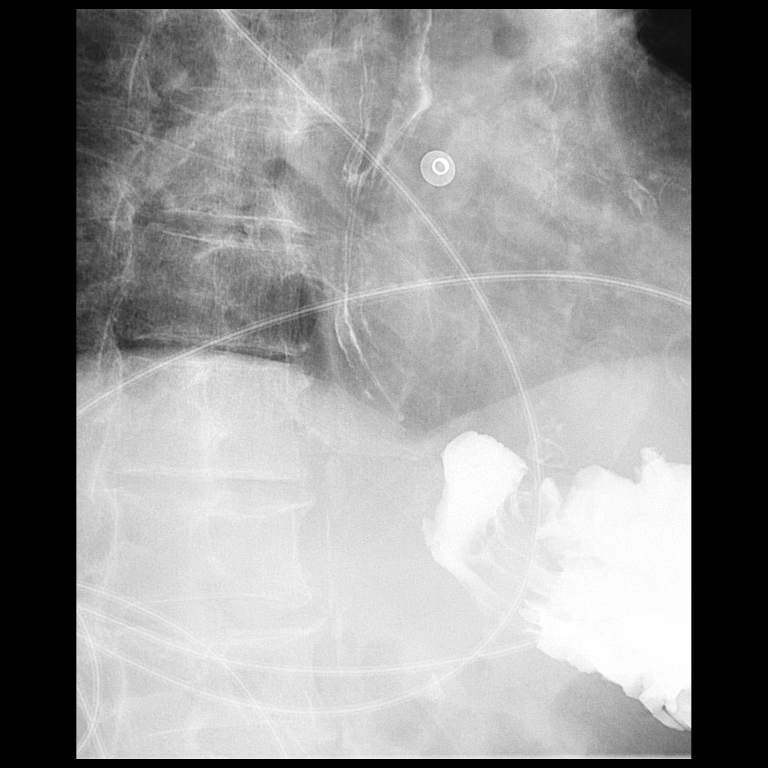

[12 of 17 positions shown; findings below may reference images not displayed]

FINDINGS: A single contrast exam was performed in semi-upright position.
Patient was relatively immobile. There is no evidence of obstruction
of the esophagus. Esophagus is not dilated. No retained food stuff
within the esophagus. No stricture or mass identified. There is poor
peristalsis within the esophagus. Minimal tertiary contractions.
Minimal retention of the barium. Small hiatal hernia.
IMPRESSION: 1. No esophageal distention or obstruction identified.
2. Poor esophageal motility.
3. Small hiatal hernia.

## 2016-12-18 IMAGING — MR MR HEAD W/O CM
8 of 10 series · 35 of 48 positions shown · non-contrast
Comparison: Head and cervical spine CT 11/30/2015

CLINICAL DATA: 85-year-old male with progressive confusion and
memory loss for 6-12 months. Difficulty swallowing for several
months. Recent syncope and collapse at home. Initial encounter.

EXAM:
MRI HEAD WITHOUT CONTRAST
TECHNIQUE: Multiplanar, multiecho pulse sequences of the brain and surrounding
structures were obtained without intravenous contrast.

[Series 3: DWI · axial · 3.0mm · 1.09mm/px · z∈[-44,+88]mm · 9 of 90 slices shown (1 of 4)]
[im 1/90]
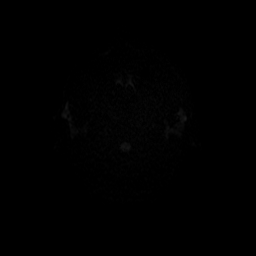
[im 12/90]
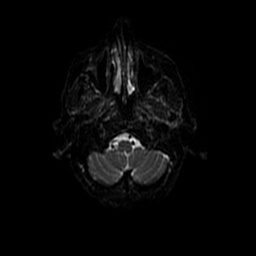
[im 23/90]
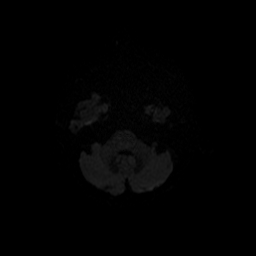
[im 34/90]
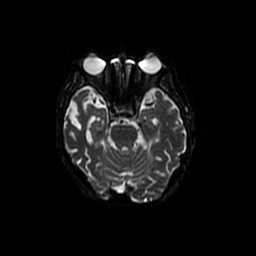
[im 45/90]
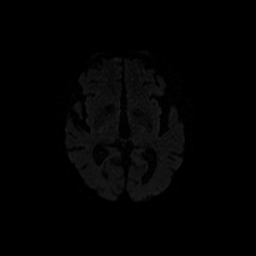
[im 56/90]
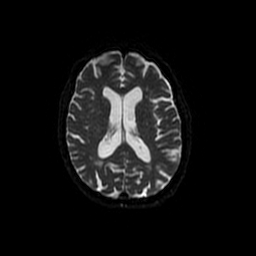
[im 67/90]
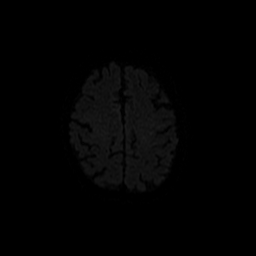
[im 78/90]
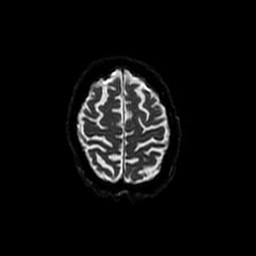
[im 90/90]
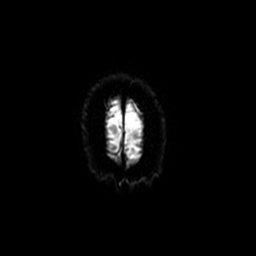

[Series 4: T1 · sagittal · 5.0mm · 0.47mm/px · 2 of 23 slices shown]
[im 1/23]
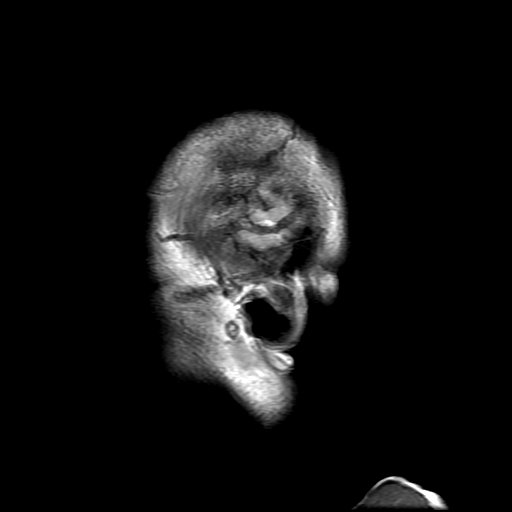
[im 23/23]
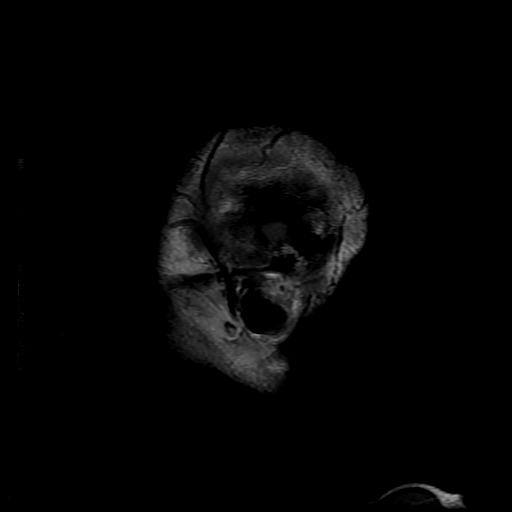

[Series 5: T2 · axial · 5.0mm · 0.43mm/px · z∈[-43,+89]mm · 3 of 23 slices shown (1 of 2)]
[im 1/23]
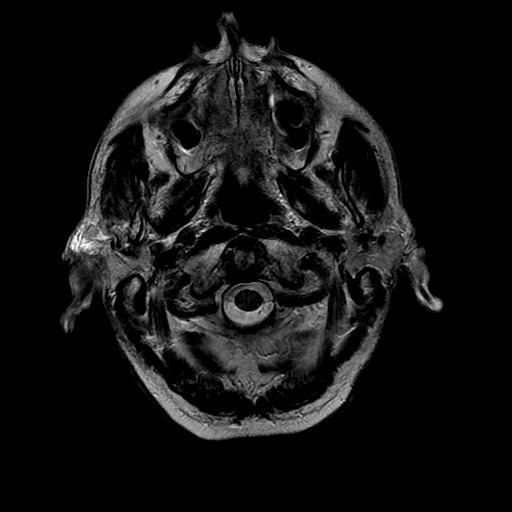
[im 12/23]
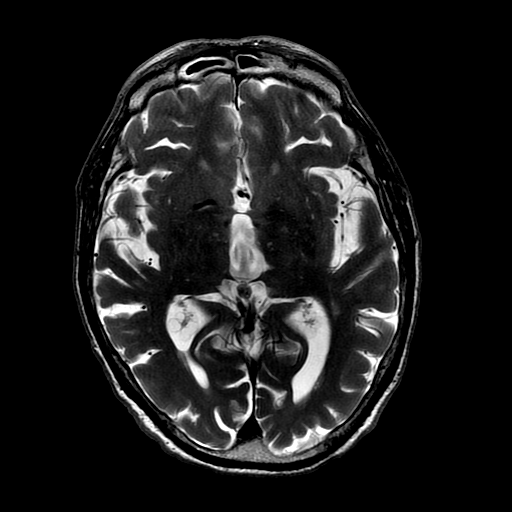
[im 23/23]
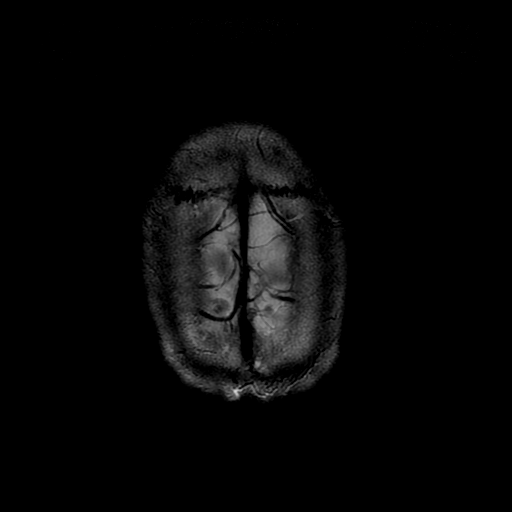

[Series 6: DWI · coronal · 5.0mm · 1.09mm/px · 7 of 62 slices shown (2 of 4)]
[im 1/62]
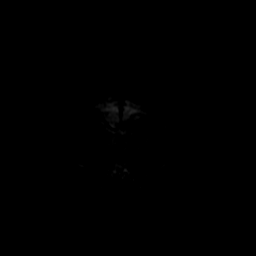
[im 11/62]
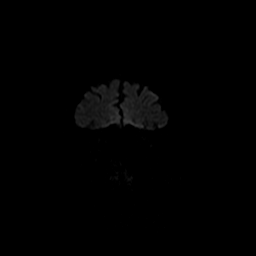
[im 21/62]
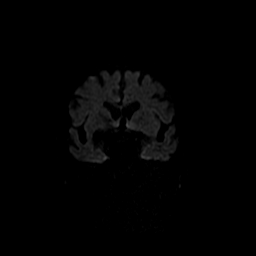
[im 31/62]
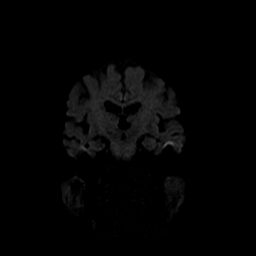
[im 41/62]
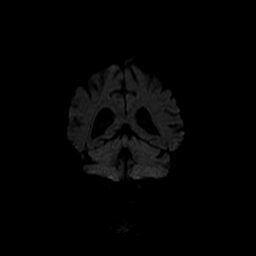
[im 51/62]
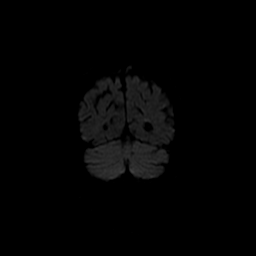
[im 62/62]
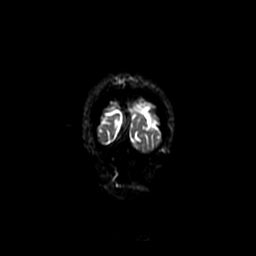

[Series 7: FLAIR · axial · 5.0mm · 0.43mm/px · z∈[-43,+89]mm · 3 of 23 slices shown]
[im 1/23]
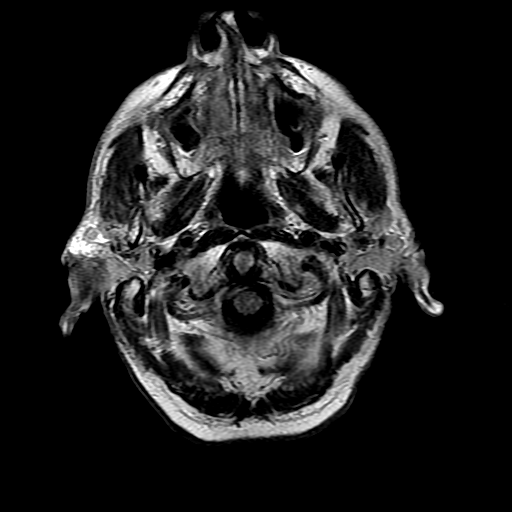
[im 12/23]
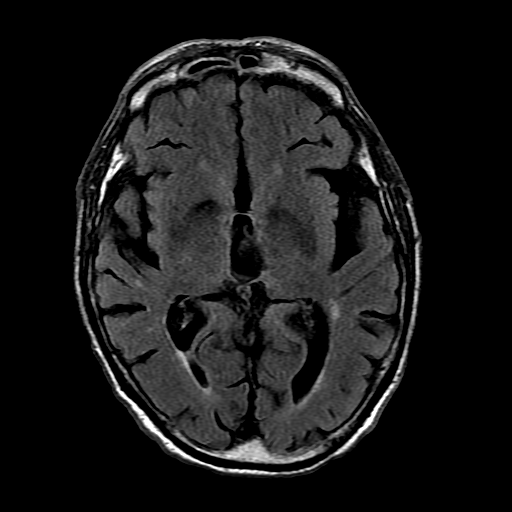
[im 23/23]
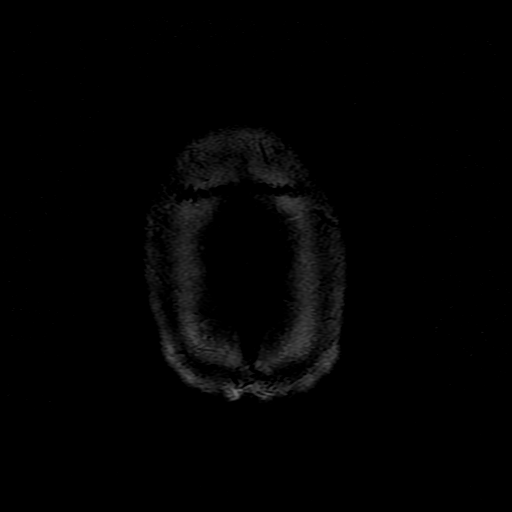

[Series 10: T2 · coronal · 5.0mm · 0.39mm/px · 3 of 24 slices shown (2 of 2)]
[im 1/24]
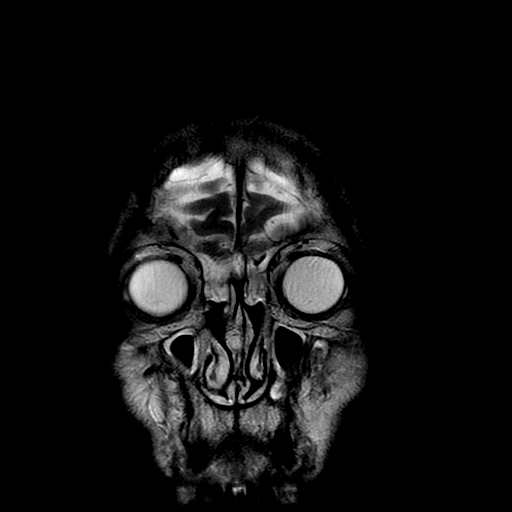
[im 12/24]
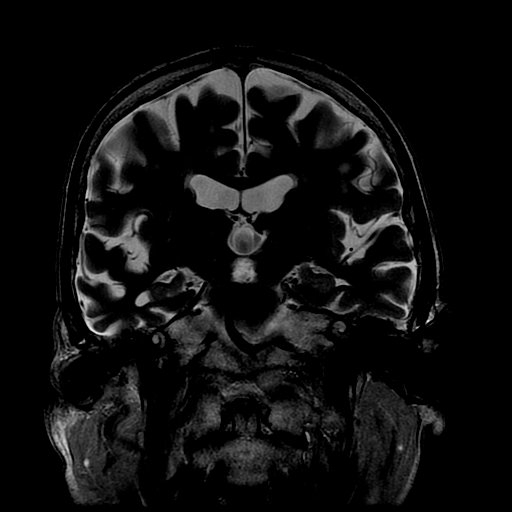
[im 24/24]
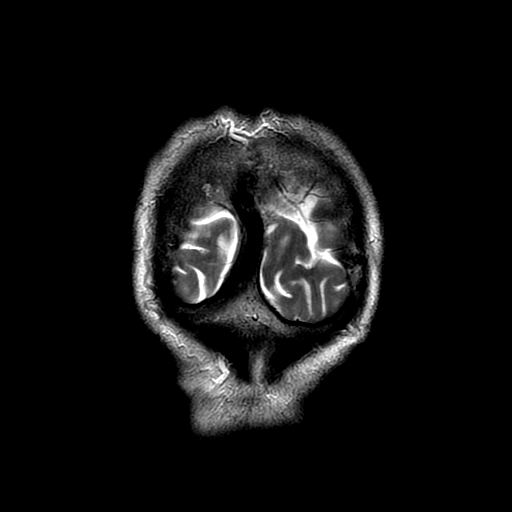

[Series 300: DWI · axial · 3.0mm · 1.09mm/px · z∈[-44,+88]mm · 5 of 45 slices shown (3 of 4)]
[im 1/45]
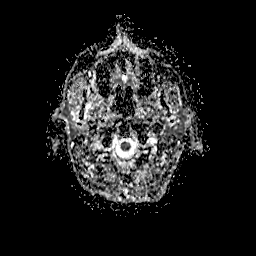
[im 12/45]
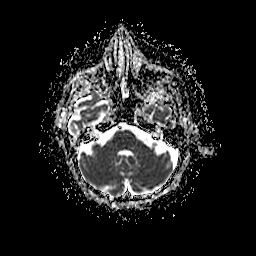
[im 23/45]
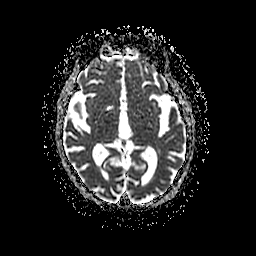
[im 34/45]
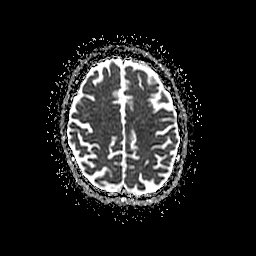
[im 45/45]
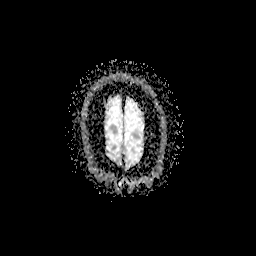

[Series 600: DWI · coronal · 5.0mm · 1.09mm/px · 3 of 31 slices shown (4 of 4)]
[im 1/31]
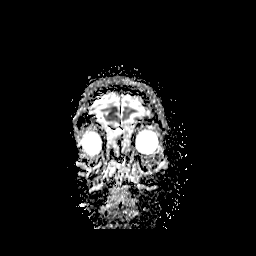
[im 16/31]
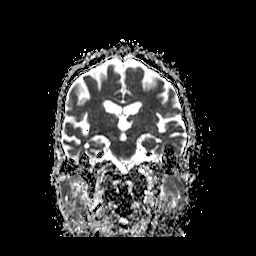
[im 31/31]
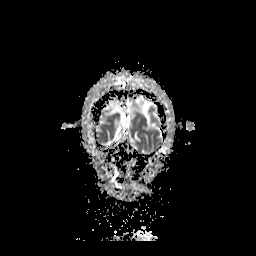

[35 of 48 positions shown; findings below may reference images not displayed]

FINDINGS: Cerebral volume is within normal limits for age. No restricted
diffusion to suggest acute infarction. No midline shift, mass
effect, evidence of mass lesion, ventriculomegaly, extra-axial
collection or acute intracranial hemorrhage. Cervicomedullary
junction and pituitary are within normal limits. Major intracranial
vascular flow voids are preserved. There is a mild degree of
generalized intracranial artery dolichoectasia.

Periventricular and other scattered cerebral white matter T2 and
FLAIR hyperintensity is nonspecific and moderate for age. No
cortical encephalomalacia or chronic cerebral blood products.
Chronic lacunar infarct of the medial left thalamus. Other deep gray
matter nuclei are within normal limits for age. Brainstem and
cerebellum within normal limits for age.

Visible internal auditory structures appear normal. Prior paranasal
sinus surgery with widespread periostitis and frontoethmoidal sinus
mucosal thickening. Negative orbit and scalp soft tissues. Normal
bone marrow signal. Widespread cervical spine disc and endplate
degeneration with multilevel mild spondylolisthesis and spinal
stenosis.
IMPRESSION: 1.  No acute intracranial abnormality.
2. Mild to moderate for age signal changes most compatible with
chronic small vessel disease.
3. Multilevel cervical spine disc and endplate degeneration with
mild spondylolisthesis and spinal stenosis.

## 2017-03-21 ENCOUNTER — Ambulatory Visit (INDEPENDENT_AMBULATORY_CARE_PROVIDER_SITE_OTHER): Payer: Medicare Other | Admitting: Neurology

## 2017-03-21 ENCOUNTER — Encounter (INDEPENDENT_AMBULATORY_CARE_PROVIDER_SITE_OTHER): Payer: Self-pay

## 2017-03-21 ENCOUNTER — Encounter: Payer: Self-pay | Admitting: Neurology

## 2017-03-21 VITALS — BP 108/52 | HR 56 | Ht 60.0 in | Wt 108.5 lb

## 2017-03-21 DIAGNOSIS — R413 Other amnesia: Secondary | ICD-10-CM | POA: Insufficient documentation

## 2017-03-21 DIAGNOSIS — E538 Deficiency of other specified B group vitamins: Secondary | ICD-10-CM | POA: Diagnosis not present

## 2017-03-21 HISTORY — DX: Other amnesia: R41.3

## 2017-03-21 MED ORDER — DONEPEZIL HCL 5 MG PO TABS: 5.0000 mg | ORAL_TABLET | Freq: Every day | ORAL | 1 refills | Status: DC
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
## 2017-03-21 NOTE — Patient Instructions (Signed)
We will start Aricept for memory.  Begin Aricept (donepezil) at 5 mg at night for one month. If this medication is well-tolerated, please call our office and we will call in a prescription for the 10 mg tablets. Look out for side effects that may include nausea, diarrhea, weight loss, or stomach cramps. This medication will also cause a runny nose, therefore there is no need for allergy medications for this purpose.  

## 2017-03-22 ENCOUNTER — Telehealth: Payer: Self-pay | Admitting: *Deleted

## 2017-03-22 LAB — RPR: RPR: NONREACTIVE

## 2017-03-22 LAB — SEDIMENTATION RATE: Sed Rate: 2 mm/hr (ref 0–30)

## 2017-03-22 LAB — VITAMIN B12: VITAMIN B 12: 490 pg/mL (ref 232–1245)

## 2017-03-22 NOTE — Telephone Encounter (Signed)
Called and LVM for wife about lab results per CW,MD note. Gave [facility] phone number if she has further questions or concerns.

## 2017-03-22 NOTE — Telephone Encounter (Signed)
Pt's wife returned RN's call. Lab results relayed, she understood with no questions.

## 2017-03-22 NOTE — Telephone Encounter (Signed)
-----   Message from Kathrynn Ducking, MD sent at 03/22/2017  8:01 AM EDT -----   The blood work results are unremarkable. Please call the patient.  ----- Message ----- From: Lavone Neri Lab Results In Sent: 03/22/2017   7:42 AM To: Kathrynn Ducking, MD

## 2017-03-22 NOTE — Telephone Encounter (Signed)
Took call from phone staff. Spoke with wife. She was wondering why Vit B12 deficiency was listed on AVS. Advised Dr Jannifer Franklin was checking his Vit B12 level to see if it was low. His level was in the normal reference range. She verbalized understanding.

## 2017-04-04 ENCOUNTER — Telehealth: Payer: Self-pay | Admitting: Neurology

## 2017-04-04 NOTE — Telephone Encounter (Signed)
Pt wife calling re: concerns of donepezil (ARICEPT) 5 MG tablet  She is wanting to know of potential side effects please call

## 2017-04-04 NOTE — Telephone Encounter (Signed)
I called patient. The patient is not having diarrhea or nausea or vomiting. He had a day or 2 not feeling quite 100%. The patient will stay on Aricept for now, they will call us if he continues to feel poorly.

## 2017-04-04 NOTE — Telephone Encounter (Signed)
Called pt's wife back and reviewed common side effects of Aricept including: feeling unwell (malaise),  appetite loss,  weight loss,  sleep problems (insomnia),  muscle cramps,  tiredness,  drowsiness,  dizziness,  weakness,  shakiness (tremor),  itchy skin,  runny nose, nausea,  vomiting, or  diarrhea.  Let her know that these effects usually last 1-3 weeks after starting the medication and then lessen. She reported that pt seemed a little slow or tired on Sat and has had a bit of a runny nose but seems to be doing well otherwise. Plans on continuing med for another week or so and will let us know how he's doing then. If side effects or symptoms worsen, she will call back before then.

## 2017-04-08 ENCOUNTER — Other Ambulatory Visit: Payer: Self-pay | Admitting: Cardiovascular Disease

## 2017-04-25 MED ORDER — DONEPEZIL HCL 10 MG PO TABS: 10.0000 mg | ORAL_TABLET | Freq: Every day | ORAL | 4 refills | Status: DC

## 2017-04-25 NOTE — Addendum Note (Signed)
Addended by: Hope Pigeon on: 04/25/2017 09:06 AM   Modules accepted: Orders

## 2017-04-25 NOTE — Telephone Encounter (Signed)
Patients wife called office in reference to patient starting Aricept.  Wife states patient is doing fine on the medication with no symptoms.  Wife would like to see if the medication is able to be increased.  Pharmacy- [facility]

## 2017-04-25 NOTE — Telephone Encounter (Signed)
E-scribed 10mg  tablet to pt pharmacy as requested. Ok per CW,MD last OV note.

## 2017-08-01 ENCOUNTER — Other Ambulatory Visit: Payer: Self-pay | Admitting: Cardiovascular Disease

## 2017-09-04 ENCOUNTER — Other Ambulatory Visit: Payer: Self-pay | Admitting: Cardiovascular Disease

## 2017-09-12 ENCOUNTER — Telehealth: Payer: Self-pay | Admitting: Cardiovascular Disease

## 2017-09-12 MED ORDER — CLOPIDOGREL BISULFATE 75 MG PO TABS: ORAL_TABLET | ORAL | 1 refills | Status: DC

## 2017-09-12 NOTE — Telephone Encounter (Signed)
New Message   *STAT* If patient is at the pharmacy, call can be transferred to refill team.   1. Which medications need to be refilled? (please list name of each medication and dose if known) Plavix 75mg    2. Which pharmacy/location (including street and city if local pharmacy) is medication to be sent to? [facility] [place]   3. Do they need a 30 day or 90 day supply? [place]

## 2017-09-17 ENCOUNTER — Inpatient Hospital Stay (HOSPITAL_COMMUNITY)
Admission: EM | Admit: 2017-09-17 | Discharge: 2017-09-22 | DRG: 871 | Disposition: A | Discharge status: Home Health | Payer: Medicare Other | Attending: Internal Medicine | Admitting: Internal Medicine

## 2017-09-17 ENCOUNTER — Encounter (HOSPITAL_COMMUNITY): Payer: Self-pay | Admitting: Emergency Medicine

## 2017-09-17 DIAGNOSIS — J189 Pneumonia, unspecified organism: Secondary | ICD-10-CM | POA: Diagnosis present

## 2017-09-17 DIAGNOSIS — R4182 Altered mental status, unspecified: Secondary | ICD-10-CM | POA: Diagnosis not present

## 2017-09-17 DIAGNOSIS — Z8673 Personal history of transient ischemic attack (TIA), and cerebral infarction without residual deficits: Secondary | ICD-10-CM

## 2017-09-17 DIAGNOSIS — I21A1 Myocardial infarction type 2: Secondary | ICD-10-CM | POA: Diagnosis present

## 2017-09-17 DIAGNOSIS — D696 Thrombocytopenia, unspecified: Secondary | ICD-10-CM | POA: Diagnosis present

## 2017-09-17 DIAGNOSIS — E876 Hypokalemia: Secondary | ICD-10-CM | POA: Diagnosis present

## 2017-09-17 DIAGNOSIS — Z7902 Long term (current) use of antithrombotics/antiplatelets: Secondary | ICD-10-CM

## 2017-09-17 DIAGNOSIS — Z886 Allergy status to analgesic agent status: Secondary | ICD-10-CM

## 2017-09-17 DIAGNOSIS — R413 Other amnesia: Secondary | ICD-10-CM | POA: Diagnosis present

## 2017-09-17 DIAGNOSIS — Z7951 Long term (current) use of inhaled steroids: Secondary | ICD-10-CM

## 2017-09-17 DIAGNOSIS — R739 Hyperglycemia, unspecified: Secondary | ICD-10-CM | POA: Diagnosis present

## 2017-09-17 DIAGNOSIS — I5032 Chronic diastolic (congestive) heart failure: Secondary | ICD-10-CM | POA: Diagnosis present

## 2017-09-17 DIAGNOSIS — Z955 Presence of coronary angioplasty implant and graft: Secondary | ICD-10-CM

## 2017-09-17 DIAGNOSIS — I130 Hypertensive heart and chronic kidney disease with heart failure and stage 1 through stage 4 chronic kidney disease, or unspecified chronic kidney disease: Secondary | ICD-10-CM | POA: Diagnosis present

## 2017-09-17 DIAGNOSIS — A4151 Sepsis due to Escherichia coli [E. coli]: Secondary | ICD-10-CM | POA: Diagnosis not present

## 2017-09-17 DIAGNOSIS — G9340 Encephalopathy, unspecified: Secondary | ICD-10-CM | POA: Diagnosis present

## 2017-09-17 DIAGNOSIS — N289 Disorder of kidney and ureter, unspecified: Secondary | ICD-10-CM

## 2017-09-17 DIAGNOSIS — I10 Essential (primary) hypertension: Secondary | ICD-10-CM | POA: Diagnosis present

## 2017-09-17 DIAGNOSIS — I359 Nonrheumatic aortic valve disorder, unspecified: Secondary | ICD-10-CM | POA: Diagnosis present

## 2017-09-17 DIAGNOSIS — A419 Sepsis, unspecified organism: Secondary | ICD-10-CM | POA: Diagnosis present

## 2017-09-17 DIAGNOSIS — J440 Chronic obstructive pulmonary disease with acute lower respiratory infection: Secondary | ICD-10-CM | POA: Diagnosis present

## 2017-09-17 DIAGNOSIS — I351 Nonrheumatic aortic (valve) insufficiency: Secondary | ICD-10-CM | POA: Diagnosis present

## 2017-09-17 DIAGNOSIS — J181 Lobar pneumonia, unspecified organism: Secondary | ICD-10-CM

## 2017-09-17 DIAGNOSIS — F0390 Unspecified dementia without behavioral disturbance: Secondary | ICD-10-CM | POA: Diagnosis present

## 2017-09-17 DIAGNOSIS — N182 Chronic kidney disease, stage 2 (mild): Secondary | ICD-10-CM | POA: Diagnosis present

## 2017-09-17 DIAGNOSIS — R6520 Severe sepsis without septic shock: Secondary | ICD-10-CM | POA: Diagnosis present

## 2017-09-17 DIAGNOSIS — I2510 Atherosclerotic heart disease of native coronary artery without angina pectoris: Secondary | ICD-10-CM | POA: Diagnosis present

## 2017-09-17 DIAGNOSIS — G92 Toxic encephalopathy: Secondary | ICD-10-CM | POA: Diagnosis present

## 2017-09-17 DIAGNOSIS — I350 Nonrheumatic aortic (valve) stenosis: Secondary | ICD-10-CM | POA: Diagnosis present

## 2017-09-17 DIAGNOSIS — R001 Bradycardia, unspecified: Secondary | ICD-10-CM | POA: Diagnosis present

## 2017-09-17 DIAGNOSIS — Z86010 Personal history of colonic polyps: Secondary | ICD-10-CM

## 2017-09-17 DIAGNOSIS — Z79899 Other long term (current) drug therapy: Secondary | ICD-10-CM

## 2017-09-17 DIAGNOSIS — E785 Hyperlipidemia, unspecified: Secondary | ICD-10-CM | POA: Diagnosis present

## 2017-09-17 DIAGNOSIS — D638 Anemia in other chronic diseases classified elsewhere: Secondary | ICD-10-CM | POA: Diagnosis present

## 2017-09-17 DIAGNOSIS — J449 Chronic obstructive pulmonary disease, unspecified: Secondary | ICD-10-CM | POA: Diagnosis present

## 2017-09-17 HISTORY — DX: Urinary tract infection, site not specified: N39.0

## 2017-09-17 LAB — COMPREHENSIVE METABOLIC PANEL
ALK PHOS: 113 U/L (ref 38–126)
ALT: 28 U/L (ref 17–63)
AST: 45 U/L — AB (ref 15–41)
Albumin: 4.1 g/dL (ref 3.5–5.0)
Anion gap: 12 (ref 5–15)
BUN: 14 mg/dL (ref 6–20)
CALCIUM: 9 mg/dL (ref 8.9–10.3)
CHLORIDE: 104 mmol/L (ref 101–111)
CO2: 22 mmol/L (ref 22–32)
CREATININE: 1.27 mg/dL — AB (ref 0.61–1.24)
GFR calc non Af Amer: 50 mL/min — ABNORMAL LOW (ref >60)
GFR, EST AFRICAN AMERICAN: 58 mL/min — AB (ref >60)
GLUCOSE: 239 mg/dL — AB (ref 65–99)
Potassium: 4.6 mmol/L (ref 3.5–5.1)
SODIUM: 138 mmol/L (ref 135–145)
Total Bilirubin: 0.9 mg/dL (ref 0.3–1.2)
Total Protein: 6.6 g/dL (ref 6.5–8.1)

## 2017-09-17 LAB — CBC
HCT: 33.2 % — ABNORMAL LOW (ref 39.0–52.0)
Hemoglobin: 11 g/dL — ABNORMAL LOW (ref 13.0–17.0)
MCH: 29.5 pg (ref 26.0–34.0)
MCHC: 33.1 g/dL (ref 30.0–36.0)
MCV: 89 fL (ref 78.0–100.0)
PLATELETS: 162 10*3/uL (ref 150–400)
RBC: 3.73 MIL/uL — AB (ref 4.22–5.81)
RDW: 14.4 % (ref 11.5–15.5)
WBC: 10.3 10*3/uL (ref 4.0–10.5)

## 2017-09-17 LAB — I-STAT CG4 LACTIC ACID, ED: LACTIC ACID, VENOUS: 3.55 mmol/L — AB (ref 0.5–1.9)

## 2017-09-17 MED ORDER — SODIUM CHLORIDE 0.9 % IV BOLUS (SEPSIS)
2000.0000 mL | Freq: Once | INTRAVENOUS | Status: AC
  Administered 2017-09-18: 2000 mL via INTRAVENOUS

## 2017-09-17 MED ORDER — ACETAMINOPHEN 650 MG RE SUPP
650.0000 mg | Freq: Once | RECTAL | Status: AC
  Administered 2017-09-18: 650 mg via RECTAL
  Filled 2017-09-17: qty 1

## 2017-09-17 NOTE — ED Triage Notes (Signed)
Per family member pt began not feeling well 2 hours ago, tremors, fever of 101, chills, emesis. Hallucinations. Per family pt had these s/s in the past with UTI.

## 2017-09-18 ENCOUNTER — Encounter (HOSPITAL_COMMUNITY): Payer: Self-pay | Admitting: Family Medicine

## 2017-09-18 ENCOUNTER — Emergency Department (HOSPITAL_COMMUNITY): Payer: Medicare Other

## 2017-09-18 ENCOUNTER — Inpatient Hospital Stay (HOSPITAL_COMMUNITY): Payer: Medicare Other

## 2017-09-18 DIAGNOSIS — J181 Lobar pneumonia, unspecified organism: Secondary | ICD-10-CM | POA: Diagnosis not present

## 2017-09-18 DIAGNOSIS — N289 Disorder of kidney and ureter, unspecified: Secondary | ICD-10-CM | POA: Diagnosis not present

## 2017-09-18 DIAGNOSIS — Z79899 Other long term (current) drug therapy: Secondary | ICD-10-CM | POA: Diagnosis not present

## 2017-09-18 DIAGNOSIS — I10 Essential (primary) hypertension: Secondary | ICD-10-CM | POA: Diagnosis not present

## 2017-09-18 DIAGNOSIS — E876 Hypokalemia: Secondary | ICD-10-CM | POA: Diagnosis present

## 2017-09-18 DIAGNOSIS — I5032 Chronic diastolic (congestive) heart failure: Secondary | ICD-10-CM | POA: Diagnosis present

## 2017-09-18 DIAGNOSIS — Z86010 Personal history of colonic polyps: Secondary | ICD-10-CM | POA: Diagnosis not present

## 2017-09-18 DIAGNOSIS — G9340 Encephalopathy, unspecified: Secondary | ICD-10-CM | POA: Diagnosis present

## 2017-09-18 DIAGNOSIS — R739 Hyperglycemia, unspecified: Secondary | ICD-10-CM | POA: Diagnosis present

## 2017-09-18 DIAGNOSIS — F0390 Unspecified dementia without behavioral disturbance: Secondary | ICD-10-CM | POA: Diagnosis present

## 2017-09-18 DIAGNOSIS — Z8673 Personal history of transient ischemic attack (TIA), and cerebral infarction without residual deficits: Secondary | ICD-10-CM | POA: Diagnosis not present

## 2017-09-18 DIAGNOSIS — I2510 Atherosclerotic heart disease of native coronary artery without angina pectoris: Secondary | ICD-10-CM | POA: Diagnosis present

## 2017-09-18 DIAGNOSIS — N182 Chronic kidney disease, stage 2 (mild): Secondary | ICD-10-CM | POA: Diagnosis present

## 2017-09-18 DIAGNOSIS — J449 Chronic obstructive pulmonary disease, unspecified: Secondary | ICD-10-CM | POA: Diagnosis not present

## 2017-09-18 DIAGNOSIS — J189 Pneumonia, unspecified organism: Secondary | ICD-10-CM | POA: Diagnosis present

## 2017-09-18 DIAGNOSIS — E785 Hyperlipidemia, unspecified: Secondary | ICD-10-CM | POA: Diagnosis present

## 2017-09-18 DIAGNOSIS — Z886 Allergy status to analgesic agent status: Secondary | ICD-10-CM | POA: Diagnosis not present

## 2017-09-18 DIAGNOSIS — I340 Nonrheumatic mitral (valve) insufficiency: Secondary | ICD-10-CM | POA: Diagnosis not present

## 2017-09-18 DIAGNOSIS — R4182 Altered mental status, unspecified: Secondary | ICD-10-CM | POA: Diagnosis present

## 2017-09-18 DIAGNOSIS — R001 Bradycardia, unspecified: Secondary | ICD-10-CM | POA: Diagnosis present

## 2017-09-18 DIAGNOSIS — J440 Chronic obstructive pulmonary disease with acute lower respiratory infection: Secondary | ICD-10-CM | POA: Diagnosis present

## 2017-09-18 DIAGNOSIS — I21A1 Myocardial infarction type 2: Secondary | ICD-10-CM | POA: Diagnosis present

## 2017-09-18 DIAGNOSIS — Z955 Presence of coronary angioplasty implant and graft: Secondary | ICD-10-CM | POA: Diagnosis not present

## 2017-09-18 DIAGNOSIS — A419 Sepsis, unspecified organism: Secondary | ICD-10-CM | POA: Diagnosis not present

## 2017-09-18 DIAGNOSIS — I359 Nonrheumatic aortic valve disorder, unspecified: Secondary | ICD-10-CM | POA: Diagnosis not present

## 2017-09-18 DIAGNOSIS — I2583 Coronary atherosclerosis due to lipid rich plaque: Secondary | ICD-10-CM | POA: Diagnosis not present

## 2017-09-18 DIAGNOSIS — R6520 Severe sepsis without septic shock: Secondary | ICD-10-CM | POA: Diagnosis present

## 2017-09-18 DIAGNOSIS — G92 Toxic encephalopathy: Secondary | ICD-10-CM | POA: Diagnosis present

## 2017-09-18 DIAGNOSIS — R413 Other amnesia: Secondary | ICD-10-CM | POA: Diagnosis not present

## 2017-09-18 DIAGNOSIS — I130 Hypertensive heart and chronic kidney disease with heart failure and stage 1 through stage 4 chronic kidney disease, or unspecified chronic kidney disease: Secondary | ICD-10-CM | POA: Diagnosis present

## 2017-09-18 DIAGNOSIS — I350 Nonrheumatic aortic (valve) stenosis: Secondary | ICD-10-CM | POA: Diagnosis present

## 2017-09-18 DIAGNOSIS — Z7951 Long term (current) use of inhaled steroids: Secondary | ICD-10-CM | POA: Diagnosis not present

## 2017-09-18 DIAGNOSIS — A4151 Sepsis due to Escherichia coli [E. coli]: Secondary | ICD-10-CM | POA: Diagnosis present

## 2017-09-18 DIAGNOSIS — Z7902 Long term (current) use of antithrombotics/antiplatelets: Secondary | ICD-10-CM | POA: Diagnosis not present

## 2017-09-18 DIAGNOSIS — I351 Nonrheumatic aortic (valve) insufficiency: Secondary | ICD-10-CM | POA: Diagnosis present

## 2017-09-18 LAB — ECHOCARDIOGRAM COMPLETE
Height: 60 in
Weight: 1837.75 oz

## 2017-09-18 LAB — BLOOD CULTURE ID PANEL (REFLEXED)
ACINETOBACTER BAUMANNII: NOT DETECTED
CANDIDA ALBICANS: NOT DETECTED
Candida glabrata: NOT DETECTED
Candida krusei: NOT DETECTED
Candida parapsilosis: NOT DETECTED
Candida tropicalis: NOT DETECTED
Carbapenem resistance: NOT DETECTED
ENTEROBACTER CLOACAE COMPLEX: NOT DETECTED
ENTEROBACTERIACEAE SPECIES: DETECTED — AB
Enterococcus species: NOT DETECTED
Escherichia coli: DETECTED — AB
HAEMOPHILUS INFLUENZAE: NOT DETECTED
Klebsiella oxytoca: NOT DETECTED
Klebsiella pneumoniae: NOT DETECTED
LISTERIA MONOCYTOGENES: NOT DETECTED
NEISSERIA MENINGITIDIS: NOT DETECTED
PSEUDOMONAS AERUGINOSA: NOT DETECTED
Proteus species: NOT DETECTED
STREPTOCOCCUS AGALACTIAE: NOT DETECTED
STREPTOCOCCUS PYOGENES: NOT DETECTED
STREPTOCOCCUS SPECIES: NOT DETECTED
Serratia marcescens: NOT DETECTED
Staphylococcus aureus (BCID): NOT DETECTED
Staphylococcus species: NOT DETECTED
Streptococcus pneumoniae: NOT DETECTED

## 2017-09-18 LAB — APTT: APTT: 32 s (ref 24–36)

## 2017-09-18 LAB — CBC WITH DIFFERENTIAL/PLATELET
BASOS ABS: 0 10*3/uL (ref 0.0–0.1)
BASOS PCT: 0 %
EOS PCT: 0 %
Eosinophils Absolute: 0 10*3/uL (ref 0.0–0.7)
HCT: 24.6 % — ABNORMAL LOW (ref 39.0–52.0)
Hemoglobin: 8.1 g/dL — ABNORMAL LOW (ref 13.0–17.0)
LYMPHS PCT: 8 %
Lymphs Abs: 0.7 10*3/uL (ref 0.7–4.0)
MCH: 29.6 pg (ref 26.0–34.0)
MCHC: 32.9 g/dL (ref 30.0–36.0)
MCV: 89.8 fL (ref 78.0–100.0)
Monocytes Absolute: 0.6 10*3/uL (ref 0.1–1.0)
Monocytes Relative: 7 %
NEUTROS ABS: 8 10*3/uL — AB (ref 1.7–7.7)
Neutrophils Relative %: 86 %
PLATELETS: 109 10*3/uL — AB (ref 150–400)
RBC: 2.74 MIL/uL — AB (ref 4.22–5.81)
RDW: 14.7 % (ref 11.5–15.5)
WBC: 9.3 10*3/uL (ref 4.0–10.5)

## 2017-09-18 LAB — GLUCOSE, CAPILLARY
Glucose-Capillary: 99 mg/dL (ref 65–99)
Glucose-Capillary: 102 mg/dL — ABNORMAL HIGH (ref 65–99)

## 2017-09-18 LAB — PROTIME-INR
INR: 1.29
Prothrombin Time: 15.9 seconds — ABNORMAL HIGH (ref 11.4–15.2)

## 2017-09-18 LAB — URINALYSIS, ROUTINE W REFLEX MICROSCOPIC
Bilirubin Urine: NEGATIVE
GLUCOSE, UA: NEGATIVE mg/dL
Hgb urine dipstick: NEGATIVE
KETONES UR: NEGATIVE mg/dL
LEUKOCYTES UA: NEGATIVE
Nitrite: NEGATIVE
PROTEIN: NEGATIVE mg/dL
Specific Gravity, Urine: 1.028 (ref 1.005–1.030)
pH: 7 (ref 5.0–8.0)

## 2017-09-18 LAB — I-STAT CG4 LACTIC ACID, ED: LACTIC ACID, VENOUS: 1.41 mmol/L (ref 0.5–1.9)

## 2017-09-18 LAB — COMPREHENSIVE METABOLIC PANEL
ALBUMIN: 2.7 g/dL — AB (ref 3.5–5.0)
ALT: 26 U/L (ref 17–63)
AST: 34 U/L (ref 15–41)
Alkaline Phosphatase: 78 U/L (ref 38–126)
Anion gap: 6 (ref 5–15)
BUN: 12 mg/dL (ref 6–20)
CHLORIDE: 111 mmol/L (ref 101–111)
CO2: 21 mmol/L — AB (ref 22–32)
CREATININE: 1.04 mg/dL (ref 0.61–1.24)
Calcium: 6.7 mg/dL — ABNORMAL LOW (ref 8.9–10.3)
GFR calc Af Amer: >60 mL/min (ref >60)
GLUCOSE: 146 mg/dL — AB (ref 65–99)
Potassium: 3.6 mmol/L (ref 3.5–5.1)
SODIUM: 138 mmol/L (ref 135–145)
Total Bilirubin: 0.5 mg/dL (ref 0.3–1.2)
Total Protein: 4.6 g/dL — ABNORMAL LOW (ref 6.5–8.1)

## 2017-09-18 LAB — I-STAT CHEM 8, ED
BUN: 22 mg/dL — AB (ref 6–20)
CHLORIDE: 105 mmol/L (ref 101–111)
Calcium, Ion: 0.99 mmol/L — ABNORMAL LOW (ref 1.15–1.40)
Creatinine, Ser: 1.2 mg/dL (ref 0.61–1.24)
Glucose, Bld: 198 mg/dL — ABNORMAL HIGH (ref 65–99)
HEMATOCRIT: 29 % — AB (ref 39.0–52.0)
Hemoglobin: 9.9 g/dL — ABNORMAL LOW (ref 13.0–17.0)
Potassium: 5.4 mmol/L — ABNORMAL HIGH (ref 3.5–5.1)
SODIUM: 140 mmol/L (ref 135–145)
TCO2: 23 mmol/L (ref 22–32)

## 2017-09-18 LAB — CBG MONITORING, ED
Glucose-Capillary: 113 mg/dL — ABNORMAL HIGH (ref 65–99)
Glucose-Capillary: 203 mg/dL — ABNORMAL HIGH (ref 65–99)
Glucose-Capillary: 78 mg/dL (ref 65–99)

## 2017-09-18 LAB — PROCALCITONIN: PROCALCITONIN: 1.56 ng/mL

## 2017-09-18 LAB — MRSA PCR SCREENING: MRSA BY PCR: NEGATIVE

## 2017-09-18 LAB — TROPONIN I
Troponin I: 0.07 ng/mL (ref <0.03)
Troponin I: 0.06 ng/mL (ref <0.03)

## 2017-09-18 LAB — LACTIC ACID, PLASMA: Lactic Acid, Venous: 1 mmol/L (ref 0.5–1.9)

## 2017-09-18 MED ORDER — HYDROCODONE-ACETAMINOPHEN 5-325 MG PO TABS
1.0000 | ORAL_TABLET | ORAL | Status: DC | PRN
  Administered 2017-09-20: 2 via ORAL
  Filled 2017-09-18: qty 2

## 2017-09-18 MED ORDER — SODIUM CHLORIDE 0.9 % IV SOLN
INTRAVENOUS | Status: DC
  Administered 2017-09-18 (×2): via INTRAVENOUS
  Administered 2017-09-19: 1 mL via INTRAVENOUS

## 2017-09-18 MED ORDER — SENNOSIDES-DOCUSATE SODIUM 8.6-50 MG PO TABS: 1.0000 | ORAL_TABLET | Freq: Every evening | ORAL | Status: DC | PRN

## 2017-09-18 MED ORDER — METHYLPREDNISOLONE SODIUM SUCC 40 MG IJ SOLR
40.0000 mg | Freq: Two times a day (BID) | INTRAMUSCULAR | Status: DC
  Administered 2017-09-18 – 2017-09-19 (×2): 40 mg via INTRAVENOUS
  Filled 2017-09-18 (×2): qty 1

## 2017-09-18 MED ORDER — ACETAMINOPHEN 325 MG PO TABS
650.0000 mg | ORAL_TABLET | Freq: Four times a day (QID) | ORAL | Status: DC | PRN
  Administered 2017-09-18 – 2017-09-21 (×2): 650 mg via ORAL
  Filled 2017-09-18 (×2): qty 2

## 2017-09-18 MED ORDER — HEPARIN SODIUM (PORCINE) 5000 UNIT/ML IJ SOLN
5000.0000 [IU] | Freq: Three times a day (TID) | INTRAMUSCULAR | Status: DC
  Administered 2017-09-18 – 2017-09-19 (×3): 5000 [IU] via SUBCUTANEOUS
  Filled 2017-09-18 (×3): qty 1

## 2017-09-18 MED ORDER — MOMETASONE FURO-FORMOTEROL FUM 200-5 MCG/ACT IN AERO
2.0000 | INHALATION_SPRAY | Freq: Two times a day (BID) | RESPIRATORY_TRACT | Status: DC
  Administered 2017-09-18 – 2017-09-22 (×7): 2 via RESPIRATORY_TRACT
  Filled 2017-09-18: qty 8.8

## 2017-09-18 MED ORDER — INSULIN ASPART 100 UNIT/ML ~~LOC~~ SOLN
0.0000 [IU] | Freq: Three times a day (TID) | SUBCUTANEOUS | Status: DC
  Administered 2017-09-19 (×2): 2 [IU] via SUBCUTANEOUS
  Administered 2017-09-19: 1 [IU] via SUBCUTANEOUS

## 2017-09-18 MED ORDER — ONDANSETRON HCL 4 MG/2ML IJ SOLN
4.0000 mg | Freq: Once | INTRAMUSCULAR | Status: AC
  Administered 2017-09-18: 4 mg via INTRAVENOUS
  Filled 2017-09-18: qty 2

## 2017-09-18 MED ORDER — MONTELUKAST SODIUM 10 MG PO TABS
10.0000 mg | ORAL_TABLET | Freq: Every day | ORAL | Status: DC
  Administered 2017-09-18 – 2017-09-21 (×4): 10 mg via ORAL
  Filled 2017-09-18 (×4): qty 1

## 2017-09-18 MED ORDER — FLUTICASONE PROPIONATE 50 MCG/ACT NA SUSP
2.0000 | Freq: Every day | NASAL | Status: DC
  Administered 2017-09-19 – 2017-09-22 (×4): 2 via NASAL
  Filled 2017-09-18 (×2): qty 16

## 2017-09-18 MED ORDER — ACETAMINOPHEN 650 MG RE SUPP: 650.0000 mg | Freq: Four times a day (QID) | RECTAL | Status: DC | PRN

## 2017-09-18 MED ORDER — DEXTROSE 5 % IV SOLN
1.0000 g | INTRAVENOUS | Status: DC
  Administered 2017-09-18 – 2017-09-20 (×3): 1 g via INTRAVENOUS
  Filled 2017-09-18 (×4): qty 1

## 2017-09-18 MED ORDER — SODIUM CHLORIDE 0.9% FLUSH
3.0000 mL | Freq: Two times a day (BID) | INTRAVENOUS | Status: DC
  Administered 2017-09-18 – 2017-09-22 (×7): 3 mL via INTRAVENOUS

## 2017-09-18 MED ORDER — CLOPIDOGREL BISULFATE 75 MG PO TABS
75.0000 mg | ORAL_TABLET | Freq: Every day | ORAL | Status: DC
  Administered 2017-09-18 – 2017-09-22 (×5): 75 mg via ORAL
  Filled 2017-09-18 (×5): qty 1

## 2017-09-18 MED ORDER — DEXTROSE 5 % IV SOLN
1.0000 g | Freq: Once | INTRAVENOUS | Status: AC
  Administered 2017-09-18: 1 g via INTRAVENOUS
  Filled 2017-09-18: qty 10

## 2017-09-18 MED ORDER — NOREPINEPHRINE BITARTRATE 1 MG/ML IV SOLN
0.0000 ug/min | Freq: Once | INTRAVENOUS | Status: DC
  Filled 2017-09-18 (×2): qty 4

## 2017-09-18 MED ORDER — ATORVASTATIN CALCIUM 40 MG PO TABS
40.0000 mg | ORAL_TABLET | Freq: Every day | ORAL | Status: DC
  Administered 2017-09-18 – 2017-09-21 (×4): 40 mg via ORAL
  Filled 2017-09-18 (×5): qty 1

## 2017-09-18 MED ORDER — INSULIN ASPART 100 UNIT/ML ~~LOC~~ SOLN: 0.0000 [IU] | Freq: Every day | SUBCUTANEOUS | Status: DC

## 2017-09-18 MED ORDER — SODIUM CHLORIDE 0.9 % IV BOLUS (SEPSIS)
2000.0000 mL | Freq: Once | INTRAVENOUS | Status: AC
  Administered 2017-09-18: 2000 mL via INTRAVENOUS

## 2017-09-18 MED ORDER — METOCLOPRAMIDE HCL 10 MG PO TABS
5.0000 mg | ORAL_TABLET | Freq: Three times a day (TID) | ORAL | Status: DC
  Administered 2017-09-18 – 2017-09-22 (×15): 5 mg via ORAL
  Filled 2017-09-18 (×15): qty 1

## 2017-09-18 MED ORDER — SERTRALINE HCL 25 MG PO TABS
25.0000 mg | ORAL_TABLET | Freq: Every day | ORAL | Status: DC
Start: 2017-09-18 — End: 2017-09-22
  Administered 2017-09-18 – 2017-09-22 (×5): 25 mg via ORAL
  Filled 2017-09-18 (×5): qty 1

## 2017-09-18 MED ORDER — DEXTROSE 5 % IV SOLN
500.0000 mg | Freq: Once | INTRAVENOUS | Status: AC
  Administered 2017-09-18: 500 mg via INTRAVENOUS
  Filled 2017-09-18: qty 500

## 2017-09-18 MED ORDER — SODIUM CHLORIDE 0.9 % IV SOLN
1.5000 g | Freq: Three times a day (TID) | INTRAVENOUS | Status: DC
  Administered 2017-09-18: 1.5 g via INTRAVENOUS
  Filled 2017-09-18 (×2): qty 1.5

## 2017-09-18 MED ORDER — VANCOMYCIN HCL IN DEXTROSE 750-5 MG/150ML-% IV SOLN: 750.0000 mg | INTRAVENOUS | Status: DC

## 2017-09-18 MED ORDER — ONDANSETRON HCL 4 MG PO TABS: 4.0000 mg | ORAL_TABLET | Freq: Four times a day (QID) | ORAL | Status: DC | PRN

## 2017-09-18 MED ORDER — DONEPEZIL HCL 10 MG PO TABS: 10.0000 mg | ORAL_TABLET | Freq: Every day | ORAL | Status: DC

## 2017-09-18 MED ORDER — BISACODYL 5 MG PO TBEC: 5.0000 mg | DELAYED_RELEASE_TABLET | Freq: Every day | ORAL | Status: DC | PRN

## 2017-09-18 MED ORDER — SODIUM CHLORIDE 0.9 % IV BOLUS (SEPSIS)
500.0000 mL | Freq: Once | INTRAVENOUS | Status: AC
  Administered 2017-09-18: 500 mL via INTRAVENOUS

## 2017-09-18 MED ORDER — IOPAMIDOL (ISOVUE-300) INJECTION 61%
INTRAVENOUS | Status: AC
  Administered 2017-09-18: 100 mL via INTRAVENOUS
  Filled 2017-09-18: qty 100

## 2017-09-18 MED ORDER — DEXTROSE 5 % IV SOLN: 500.0000 mg | INTRAVENOUS | Status: DC

## 2017-09-18 MED ORDER — ONDANSETRON HCL 4 MG/2ML IJ SOLN: 4.0000 mg | Freq: Four times a day (QID) | INTRAMUSCULAR | Status: DC | PRN

## 2017-09-18 MED ORDER — PANTOPRAZOLE SODIUM 40 MG PO TBEC
40.0000 mg | DELAYED_RELEASE_TABLET | Freq: Every day | ORAL | Status: DC
  Administered 2017-09-18 – 2017-09-19 (×2): 40 mg via ORAL
  Filled 2017-09-18 (×2): qty 1

## 2017-09-18 MED ORDER — VANCOMYCIN HCL IN DEXTROSE 1-5 GM/200ML-% IV SOLN
1000.0000 mg | Freq: Once | INTRAVENOUS | Status: AC
  Administered 2017-09-18: 1000 mg via INTRAVENOUS
  Filled 2017-09-18: qty 200

## 2017-09-18 NOTE — Progress Notes (Signed)
Pharmacy Antibiotic Note  Edward [name] is a 83 y.o. male admitted on 09/17/2017 with AMS/fever, possible aspiration PNA.  Pharmacy has been consulted for Vancomycin and cefepime dosing. WBC currently wnl, lactic acid trending down, febrile, and hypotensive. His blood culture started growing GNR's and the unasyn was broadened to cefepime. He was also started on azithromycin, received one dose of unasyn, and one dose of CTX.   Plan: Vancomycin 1 g IV now, then 750 mg IV q48h Stop Unasyn Start Cefepime 1g Q24 hrs Azithromycin 500mg  Q24hrs  Height: 5' (152.4 cm) Weight: 108 lb (49 kg) (pulled from office visit 03/21/17, please verify) IBW/kg (Calculated) : 50  Temp (24hrs), Avg:100.1 F (37.8 C), Min:98.1 F (36.7 C), Max:103.2 F (39.6 C)   Recent Labs Lab 09/17/17 2306 09/17/17 2323 09/18/17 0019 09/18/17 0246 09/18/17 0546 09/18/17 0548  WBC 10.3  --   --   --   --  9.3  CREATININE 1.27*  --  1.20  --   --  1.04  LATICACIDVEN  --  3.55*  --  1.41 1.0  --     Estimated Creatinine Clearance: 36 mL/min (by C-G formula based on SCr of 1.04 mg/dL).    Antibiotics on this admission: Vancomycin 10/28>> Unasyn 10/28>>10/28 (one dose given, then broadened) Azithromycin 10/28>> Cefepime 10/28>>  Ceftriaxone 10/28>> 10/28  Cultures: Blood Cx 10/27>> GNRs, culture pending   Patterson Hammersmith PharmD PGY1 Pharmacy Practice Resident 09/18/2017 11:52 AM Pager: [phone]

## 2017-09-18 NOTE — Progress Notes (Signed)
Md made aware about 2 sec. pause on telemetry, pt is asymptomatic.no order given.

## 2017-09-18 NOTE — Progress Notes (Signed)
No charge note.  For details, please see H&P from my partner Dr. Myna Hidalgo earlier this morning.  In brief, 83 yo M with HTN, dementia, COPD presents with sepsis, suspect pneumonia.  Got 30 cc/kg fluids, BP tenuous, but stable.  Bradycardic, no nodal agents.  Sepsis from pneumonia Vanc and Cefepime and Azithro Steroids Further fluids now, if BP stays with MAP < 65, will consult PCCM   Bradycardia Monitor on tele Stop donepezil  Hypertension and CAD Hypotensive Hold ramipril Echocardiogram ordered Check troponin  COPD
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
## 2017-09-18 NOTE — ED Notes (Signed)
Pt's family request blood draw from IV.  Nurse will draw all labs.

## 2017-09-18 NOTE — ED Notes (Signed)
Attempted to call report to [place] x 1.

## 2017-09-18 NOTE — ED Notes (Signed)
Pt's lunch tray at bedside.

## 2017-09-18 NOTE — ED Notes (Signed)
Lunch tray ordered; heart healthy diet 

## 2017-09-18 NOTE — Progress Notes (Signed)
PHARMACY - PHYSICIAN COMMUNICATION CRITICAL VALUE ALERT - BLOOD CULTURE IDENTIFICATION (BCID)  Results for orders placed or performed during the hospital encounter of 09/17/17  Blood Culture ID Panel (Reflexed) (Collected: 09/17/2017 10:07 PM)  Result Value Ref Range   Enterococcus species NOT DETECTED NOT DETECTED   Listeria monocytogenes NOT DETECTED NOT DETECTED   Staphylococcus species NOT DETECTED NOT DETECTED   Staphylococcus aureus NOT DETECTED NOT DETECTED   Streptococcus species NOT DETECTED NOT DETECTED   Streptococcus agalactiae NOT DETECTED NOT DETECTED   Streptococcus pneumoniae NOT DETECTED NOT DETECTED   Streptococcus pyogenes NOT DETECTED NOT DETECTED   Acinetobacter baumannii NOT DETECTED NOT DETECTED   Enterobacteriaceae species DETECTED (A) NOT DETECTED   Enterobacter cloacae complex NOT DETECTED NOT DETECTED   Escherichia coli DETECTED (A) NOT DETECTED   Klebsiella oxytoca NOT DETECTED NOT DETECTED   Klebsiella pneumoniae NOT DETECTED NOT DETECTED   Proteus species NOT DETECTED NOT DETECTED   Serratia marcescens NOT DETECTED NOT DETECTED   Carbapenem resistance NOT DETECTED NOT DETECTED   Haemophilus influenzae NOT DETECTED NOT DETECTED   Neisseria meningitidis NOT DETECTED NOT DETECTED   Pseudomonas aeruginosa NOT DETECTED NOT DETECTED   Candida albicans NOT DETECTED NOT DETECTED   Candida glabrata NOT DETECTED NOT DETECTED   Candida krusei NOT DETECTED NOT DETECTED   Candida parapsilosis NOT DETECTED NOT DETECTED   Candida tropicalis NOT DETECTED NOT DETECTED    Name of physician (or Provider) Contacted: Dr. Jackqulyn Livings  Changes to prescribed antibiotics required: None. Currently on IV Cefepime. May consider stopping vancomycin   Albertina Parr, PharmD., BCPS Clinical Pharmacist Pager [phone]
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
## 2017-09-18 NOTE — Progress Notes (Signed)
Latest Tropinin level 0.06. MD was notified.

## 2017-09-18 NOTE — Progress Notes (Signed)
  Echocardiogram 2D Echocardiogram has been performed.  Edward [name] 09/18/2017, 3:27 PM

## 2017-09-18 NOTE — ED Notes (Signed)
Pt's CBG result is 78. Informed Horris Latino - RN.

## 2017-09-18 NOTE — Progress Notes (Signed)
Admitted from the ED by bed awake and  alert. 

## 2017-09-18 NOTE — Progress Notes (Signed)
Pharmacy Antibiotic Note  Edward [name] is a 83 y.o. male admitted on 09/17/2017 with AMS/fever, possible aspiration PNA.  Pharmacy has been consulted for Vancomycin and Unasyn dosing.  Plan: Vancomycin 1 g IV now, then 750 mg IV q48h Unasyn 1.5 g IV q8h    Temp (24hrs), Avg:100.1 F (37.8 C), Min:98.1 F (36.7 C), Max:103.2 F (39.6 C)   Recent Labs Lab 09/17/17 2306 09/17/17 2323 09/18/17 0019 09/18/17 0246  WBC 10.3  --   --   --   CREATININE 1.27*  --  1.20  --   LATICACIDVEN  --  3.55*  --  1.41    CrCl cannot be calculated (Unknown ideal weight.).    Allergies  Allergen Reactions  . Aspirin Rash    Edward [name] 09/18/2017 5:08 AM

## 2017-09-18 NOTE — Progress Notes (Signed)
Latest troponin level -0.07. Md made aware with order for repeat after 4 hours.

## 2017-09-19 DIAGNOSIS — A4151 Sepsis due to Escherichia coli [E. coli]: Principal | ICD-10-CM

## 2017-09-19 LAB — CBC
HCT: 27.4 % — ABNORMAL LOW (ref 39.0–52.0)
Hemoglobin: 8.6 g/dL — ABNORMAL LOW (ref 13.0–17.0)
MCH: 28.8 pg (ref 26.0–34.0)
MCHC: 31.4 g/dL (ref 30.0–36.0)
MCV: 91.6 fL (ref 78.0–100.0)
PLATELETS: 116 10*3/uL — AB (ref 150–400)
RBC: 2.99 MIL/uL — AB (ref 4.22–5.81)
RDW: 15 % (ref 11.5–15.5)
WBC: 8.4 10*3/uL (ref 4.0–10.5)

## 2017-09-19 LAB — COMPREHENSIVE METABOLIC PANEL
ALBUMIN: 2.9 g/dL — AB (ref 3.5–5.0)
ALK PHOS: 89 U/L (ref 38–126)
ALT: 26 U/L (ref 17–63)
AST: 32 U/L (ref 15–41)
Anion gap: 6 (ref 5–15)
BUN: 16 mg/dL (ref 6–20)
CALCIUM: 7 mg/dL — AB (ref 8.9–10.3)
CHLORIDE: 113 mmol/L — AB (ref 101–111)
CO2: 21 mmol/L — AB (ref 22–32)
CREATININE: 1.18 mg/dL (ref 0.61–1.24)
GFR calc Af Amer: >60 mL/min (ref >60)
GFR calc non Af Amer: 54 mL/min — ABNORMAL LOW (ref >60)
GLUCOSE: 154 mg/dL — AB (ref 65–99)
Potassium: 4 mmol/L (ref 3.5–5.1)
SODIUM: 140 mmol/L (ref 135–145)
Total Bilirubin: 0.3 mg/dL (ref 0.3–1.2)
Total Protein: 5 g/dL — ABNORMAL LOW (ref 6.5–8.1)

## 2017-09-19 LAB — GLUCOSE, CAPILLARY
GLUCOSE-CAPILLARY: 127 mg/dL — AB (ref 65–99)
GLUCOSE-CAPILLARY: 198 mg/dL — AB (ref 65–99)
GLUCOSE-CAPILLARY: 115 mg/dL — AB (ref 65–99)
Glucose-Capillary: 139 mg/dL — ABNORMAL HIGH (ref 65–99)

## 2017-09-19 LAB — URINE CULTURE

## 2017-09-19 MED ORDER — SODIUM CHLORIDE 0.9 % IV SOLN
1.0000 g | Freq: Once | INTRAVENOUS | Status: AC
  Administered 2017-09-19: 1 g via INTRAVENOUS
  Filled 2017-09-19: qty 10

## 2017-09-19 MED ORDER — ENOXAPARIN SODIUM 40 MG/0.4ML ~~LOC~~ SOLN
40.0000 mg | SUBCUTANEOUS | Status: DC
  Administered 2017-09-19 – 2017-09-21 (×3): 40 mg via SUBCUTANEOUS
  Filled 2017-09-19 (×3): qty 0.4

## 2017-09-19 NOTE — Progress Notes (Signed)
PROGRESS NOTE    Edward [name]  VZC:[id number] DOB: 11/02/1930 DOA: 09/17/2017 PCP: [name], MD      Brief Narrative:  83 yo M with COPD, CAD< HTN who presents with encephalopathy, hypotension, sepsis syndrome on 10/28.  Started on vanc/Unasyn initially, broadened on HD1 to Vanc/Cefepime/Azithromycin, cultures growing E coli on 10/28 evening, narrowed to Cefepime.  BP improved with fluids.   Assessment & Plan:  Principal Problem:   Sepsis due to pneumonia [facility]) Active Problems:   COPD (chronic obstructive pulmonary disease) (HCC)   Essential hypertension   CAD (coronary artery disease)   Acute encephalopathy   Mild renal insufficiency   Hyperglycemia   Sepsis from E coli bacteremia 1/1 cultures growing E. coli.  Blood pressure improved with fluids, mentation better today.  Suspected source aspiration pneumonia?  Possibly UTI.  No cather in place, CT shows no stones, intraabd abscess or other foci of infection. -Continue cefepime -Discontinue steroids -Follow sensitivities and repeat cultures  -Echo normal, shows good EF, no significant valvular disease   Bradycardia Monitor on tele.  Chronic. -Stop donepezil  Hypertension and CAD BP improved today.  Troponin flat, normal, EF preserved. -Hold ramipril -Continue statin, Plavix  COPD -Continue inhalers, Singulair -Hold PPI while on Abx  Troponin elevation Flat trend.  Type 2 NSTEMI, not ACS.  From demand.    Other medications -Continue sertraline      DVT prophylaxis: Lovenox Code Status: FULL  Family Communication: Daughter in law at bedside Disposition Plan: IV fluids and continue IV antibiotics and monitor BP and culture sensitivities.  Plan for 10-14 day course, if repeat cultures negative.    Consultants:   None  Procedures:   None  Antimicrobials:   Cefepime 10/28 >>    Subjective: Feeling better.  Oriented to [facility], 2018, October.  Somewhat slowed, but also HOH.  States he feels  considerably better, just weak.  No abdominal pain, cough, sputum, back pain.  Objective: Vitals:   09/19/17 0523 09/19/17 0800 09/19/17 0818 09/19/17 0849  BP: (!) 119/52 (!) 116/45    Pulse: 67 (!) 44 (!) 52   Resp: 19 11 12    Temp: (!) 97.4 F (36.3 C) 98 F (36.7 C)    TempSrc: Oral Oral    SpO2: 98% 98% 98% 98%  Weight:      Height:        Intake/Output Summary (Last 24 hours) at 09/19/17 1153 Last data filed at 09/19/17 1022  Gross per 24 hour  Intake             2173 ml  Output              400 ml  Net             1773 ml   Filed Weights   09/18/17 1100 09/18/17 1416 09/19/17 0517  Weight: 49 kg (108 lb) 52.1 kg (114 lb 13.8 oz) 48.8 kg (107 lb 9.4 oz)    Examination: General appearance: Elderly frail adult male, alert and in no acute distress.    Weak appearing. HEENT: Anicteric, conjunctiva pink, lids and lashes normal. No nasal deformity, discharge, epistaxis.   Skin: Warm and dry.  Color improved dramatically.  No jaundice.  No suspicious rashes or lesions. Cardiac: Slow regular, nl S1-S2, no murmurs appreciated.  Capillary refill is brisk.  JVP not visible.  No LE edema.  Radial pulses 2+ and symmetric. Respiratory: Normal respiratory rate and rhythm.  CTAB without rales or wheezes. Abdomen:  Abdomen soft.  No TTP. No ascites, distension, hepatosplenomegaly.   MSK: No deformities or effusions. Neuro: Awake and alert, oriented, very HOH, weak globally.  EOMI, moves all extremities. Speech fluent.    Psych: Sensorium intact and responding to questions, attention somewhat diminished by hearing. Affect blunted.  Judgment and insight appear normal.    Data Reviewed: I have personally reviewed following labs and imaging studies:  CBC:  Recent Labs Lab 09/17/17 2306 09/18/17 0019 09/18/17 0548 09/19/17 0236  WBC 10.3  --  9.3 8.4  NEUTROABS  --   --  8.0*  --   HGB 11.0* 9.9* 8.1* 8.6*  HCT 33.2* 29.0* 24.6* 27.4*  MCV 89.0  --  89.8 91.6  PLT 162  --   109* 867*   Basic Metabolic Panel:  Recent Labs Lab 09/17/17 2306 09/18/17 0019 09/18/17 0548 09/19/17 0236  NA 138 140 138 140  K 4.6 5.4* 3.6 4.0  CL 104 105 111 113*  CO2 22  --  21* 21*  GLUCOSE 239* 198* 146* 154*  BUN 14 22* 12 16  CREATININE 1.27* 1.20 1.04 1.18  CALCIUM 9.0  --  6.7* 7.0*   GFR: Estimated Creatinine Clearance: 31.6 mL/min (by C-G formula based on SCr of 1.18 mg/dL). Liver Function Tests:  Recent Labs Lab 09/17/17 2306 09/18/17 0548 09/19/17 0236  AST 45* 34 32  ALT 28 26 26   ALKPHOS 113 78 89  BILITOT 0.9 0.5 0.3  PROT 6.6 4.6* 5.0*  ALBUMIN 4.1 2.7* 2.9*   No results for input(s): LIPASE, AMYLASE in the last 168 hours. No results for input(s): AMMONIA in the last 168 hours. Coagulation Profile:  Recent Labs Lab 09/18/17 0548  INR 1.29   Cardiac Enzymes:  Recent Labs Lab 09/18/17 1433 09/18/17 2139  TROPONINI 0.07* 0.06*   BNP (last 3 results) No results for input(s): PROBNP in the last 8760 hours. HbA1C: No results for input(s): HGBA1C in the last 72 hours. CBG:  Recent Labs Lab 09/18/17 0802 09/18/17 1230 09/18/17 1657 09/18/17 2111 09/19/17 0803  GLUCAP 113* 78 99 102* 139*   Lipid Profile: No results for input(s): CHOL, HDL, LDLCALC, TRIG, CHOLHDL, LDLDIRECT in the last 72 hours. Thyroid Function Tests: No results for input(s): TSH, T4TOTAL, FREET4, T3FREE, THYROIDAB in the last 72 hours. Anemia Panel: No results for input(s): VITAMINB12, FOLATE, FERRITIN, TIBC, IRON, RETICCTPCT in the last 72 hours. Urine analysis:    Component Value Date/Time   COLORURINE YELLOW 09/17/2017 2250   APPEARANCEUR CLEAR 09/17/2017 2250   LABSPEC 1.028 09/17/2017 2250   PHURINE 7.0 09/17/2017 2250   GLUCOSEU NEGATIVE 09/17/2017 2250   HGBUR NEGATIVE 09/17/2017 2250   BILIRUBINUR NEGATIVE 09/17/2017 2250   KETONESUR NEGATIVE 09/17/2017 2250   PROTEINUR NEGATIVE 09/17/2017 2250   UROBILINOGEN 1.0 01/24/2012 1216   NITRITE  NEGATIVE 09/17/2017 2250   LEUKOCYTESUR NEGATIVE 09/17/2017 2250   Sepsis Labs: @LABRCNTIP (procalcitonin:4,lacticidven:4)  ) Recent Results (from the past 240 hour(s))  Blood Culture (routine x 2)     Status: Abnormal (Preliminary result)   Collection Time: 09/17/17 10:07 PM  Result Value Ref Range Status   Specimen Description BLOOD LEFT ARM  Final   Special Requests   Final    BOTTLES DRAWN AEROBIC AND ANAEROBIC Blood Culture results may not be optimal due to an excessive volume of blood received in culture bottles   Culture  Setup Time   Final    GRAM NEGATIVE RODS ANAEROBIC BOTTLE ONLY CRITICAL RESULT CALLED TO, READ  BACK BY AND VERIFIED WITH: B MANCHERIL,PHARMD AT 4174 09/18/17 BY L BENFIELD    Culture ESCHERICHIA COLI SUSCEPTIBILITIES TO FOLLOW  (A)  Final   Report Status PENDING  Incomplete  Blood Culture ID Panel (Reflexed)     Status: Abnormal   Collection Time: 09/17/17 10:07 PM  Result Value Ref Range Status   Enterococcus species NOT DETECTED NOT DETECTED Final   Listeria monocytogenes NOT DETECTED NOT DETECTED Final   Staphylococcus species NOT DETECTED NOT DETECTED Final   Staphylococcus aureus NOT DETECTED NOT DETECTED Final   Streptococcus species NOT DETECTED NOT DETECTED Final   Streptococcus agalactiae NOT DETECTED NOT DETECTED Final   Streptococcus pneumoniae NOT DETECTED NOT DETECTED Final   Streptococcus pyogenes NOT DETECTED NOT DETECTED Final   Acinetobacter baumannii NOT DETECTED NOT DETECTED Final   Enterobacteriaceae species DETECTED (A) NOT DETECTED Final    Comment: Enterobacteriaceae represent a large family of gram-negative bacteria, not a single organism. CRITICAL RESULT CALLED TO, READ BACK BY AND VERIFIED WITH: B MANCHERIL,PHARMD AT 1319 09/18/17 BY L BENFIELD    Enterobacter cloacae complex NOT DETECTED NOT DETECTED Final   Escherichia coli DETECTED (A) NOT DETECTED Final    Comment: CRITICAL RESULT CALLED TO, READ BACK BY AND VERIFIED  WITH: B MANCHERIL,PHARMD AT 1319 09/18/17 BY L BENFIELD    Klebsiella oxytoca NOT DETECTED NOT DETECTED Final   Klebsiella pneumoniae NOT DETECTED NOT DETECTED Final   Proteus species NOT DETECTED NOT DETECTED Final   Serratia marcescens NOT DETECTED NOT DETECTED Final   Carbapenem resistance NOT DETECTED NOT DETECTED Final   Haemophilus influenzae NOT DETECTED NOT DETECTED Final   Neisseria meningitidis NOT DETECTED NOT DETECTED Final   Pseudomonas aeruginosa NOT DETECTED NOT DETECTED Final   Candida albicans NOT DETECTED NOT DETECTED Final   Candida glabrata NOT DETECTED NOT DETECTED Final   Candida krusei NOT DETECTED NOT DETECTED Final   Candida parapsilosis NOT DETECTED NOT DETECTED Final   Candida tropicalis NOT DETECTED NOT DETECTED Final  Urine culture     Status: Abnormal   Collection Time: 09/18/17  1:55 AM  Result Value Ref Range Status   Specimen Description URINE, CLEAN CATCH  Final   Special Requests NONE  Final   Culture (A)  Final    30,000 COLONIES/mL DIPHTHEROIDS(CORYNEBACTERIUM SPECIES) Standardized susceptibility testing for this organism is not available.    Report Status 09/19/2017 FINAL  Final  MRSA PCR Screening     Status: None   Collection Time: 09/18/17  2:13 PM  Result Value Ref Range Status   MRSA by PCR NEGATIVE NEGATIVE Final    Comment:        The GeneXpert MRSA Assay (FDA approved for NASAL specimens only), is one component of a comprehensive MRSA colonization surveillance program. It is not intended to diagnose MRSA infection nor to guide or monitor treatment for MRSA infections.          Radiology Studies: Dg Chest 2 View  Result Date: 09/18/2017 CLINICAL DATA:  Sepsis EXAM: CHEST  2 VIEW COMPARISON:  09/04/2016 FINDINGS: Mild diffuse chronic bronchitic changes. No acute consolidation or effusion. Stable cardiomediastinal silhouette with borderline cardiomegaly. Aortic atherosclerosis. No pneumothorax. IMPRESSION: No focal  pulmonary infiltrate. Electronically Signed   By: Donavan Foil M.D.   On: 09/18/2017 00:52   Ct Abdomen Pelvis W Contrast  Result Date: 09/18/2017 CLINICAL DATA:  Acute onset of nausea and vomiting. Shaking. Initial encounter. EXAM: CT ABDOMEN AND PELVIS WITH CONTRAST TECHNIQUE: Multidetector CT imaging of  the abdomen and pelvis was performed using the standard protocol following bolus administration of intravenous contrast. CONTRAST:  128mL ISOVUE-300 IOPAMIDOL (ISOVUE-300) INJECTION 61% COMPARISON:  Pelvic radiograph performed 11/30/2015, and CT of the abdomen and pelvis from 03/17/2015 FINDINGS: Lower chest: Diffuse coronary artery calcifications are seen. Minimal scarring is noted at the lung bases. A likely small mucus plug is noted at the right middle lobe. Minimal peripheral opacity at the right lower lobe may reflect mild infection. A small hiatal hernia is noted. Hepatobiliary: The liver is unremarkable in appearance. The gallbladder is unremarkable in appearance. The common bile duct remains normal in caliber. Pancreas: The pancreas is within normal limits. Spleen: The spleen is unremarkable in appearance. Adrenals/Urinary Tract: The adrenal glands are unremarkable in appearance. A prominent right renal cyst is noted. Mild left-sided renal pelvicaliectasis remains within normal limits. Nonspecific perinephric stranding is noted bilaterally. There is no evidence of hydronephrosis. No renal or ureteral stones are identified. Stomach/Bowel: The stomach is unremarkable in appearance. The small bowel is within normal limits. The appendix is normal in caliber, without evidence of appendicitis. Diffuse diverticulosis is noted along the descending and proximal sigmoid colon, without evidence of diverticulitis. Vascular/Lymphatic: Diffuse calcification is seen along the abdominal aorta and its branches. The abdominal aorta is otherwise grossly unremarkable. The inferior vena cava is grossly unremarkable. No  retroperitoneal lymphadenopathy is seen. No pelvic sidewall lymphadenopathy is identified. Reproductive: Mild bladder wall thickening could reflect cystitis. Would correlate with the patient's symptoms. The prostate is mildly enlarged, measuring 5.0 cm in transverse dimension. Other: No additional soft tissue abnormalities are seen. Musculoskeletal: No acute osseous abnormalities are identified. The left femoral intramedullary rod is incompletely imaged but appears grossly unremarkable. Multilevel vacuum phenomenon is noted along the lumbar spine. The visualized musculature is unremarkable in appearance. IMPRESSION: 1. Mild bladder wall thickening could reflect cystitis. Would correlate with the patient's symptoms. 2. Likely small mucus plug at the right middle lung lobe. Minimal peripheral opacity at the right lower lobe may reflect mild infection. 3. Mildly enlarged prostate. 4. Diffuse aortic atherosclerosis. 5. Diffuse diverticulosis along the descending and proximal sigmoid colon, without evidence of diverticulitis. 6. Small hiatal hernia noted. 7. Prominent right renal cyst. 8. Degenerative change along the lumbar spine. Electronically Signed   By: Garald Balding M.D.   On: 09/18/2017 02:24        Scheduled Meds: . atorvastatin  40 mg Oral q1800  . clopidogrel  75 mg Oral Daily  . fluticasone  2 spray Each Nare Daily  . heparin  5,000 Units Subcutaneous Q8H  . insulin aspart  0-5 Units Subcutaneous QHS  . insulin aspart  0-9 Units Subcutaneous TID WC  . methylPREDNISolone (SOLU-MEDROL) injection  40 mg Intravenous BID  . metoCLOPramide  5 mg Oral TID AC & HS  . mometasone-formoterol  2 puff Inhalation BID  . montelukast  10 mg Oral QHS  . pantoprazole  40 mg Oral Daily  . sertraline  25 mg Oral Daily  . sodium chloride flush  3 mL Intravenous Q12H   Continuous Infusions: . sodium chloride 100 mL/hr at 09/18/17 1716  . ceFEPime (MAXIPIME) IV Stopped (09/18/17 1636)     LOS: 1 day     Time spent: 25 minutes    Edwin Dada, MD [facility] Pager [phone]  If 7PM-7AM, please contact night-coverage [email or web address] Password [email or web address] 09/19/2017, 11:53 AM

## 2017-09-19 NOTE — Progress Notes (Signed)
Pt HR dropped to the 40s. MD notified.Will keep a close eye on his HR and BP.

## 2017-09-20 ENCOUNTER — Ambulatory Visit: Payer: Medicare Other | Admitting: Adult Health

## 2017-09-20 LAB — COMPREHENSIVE METABOLIC PANEL
ALK PHOS: 72 U/L (ref 38–126)
ALT: 29 U/L (ref 17–63)
ANION GAP: 7 (ref 5–15)
AST: 30 U/L (ref 15–41)
Albumin: 2.7 g/dL — ABNORMAL LOW (ref 3.5–5.0)
BILIRUBIN TOTAL: 0.5 mg/dL (ref 0.3–1.2)
BUN: 20 mg/dL (ref 6–20)
CALCIUM: 7.3 mg/dL — AB (ref 8.9–10.3)
CO2: 14 mmol/L — ABNORMAL LOW (ref 22–32)
Chloride: 120 mmol/L — ABNORMAL HIGH (ref 101–111)
Creatinine, Ser: 1.15 mg/dL (ref 0.61–1.24)
GFR, EST NON AFRICAN AMERICAN: 56 mL/min — AB (ref >60)
GLUCOSE: 124 mg/dL — AB (ref 65–99)
Potassium: 3.8 mmol/L (ref 3.5–5.1)
Sodium: 141 mmol/L (ref 135–145)
TOTAL PROTEIN: 4.8 g/dL — AB (ref 6.5–8.1)

## 2017-09-20 LAB — CBC
HCT: 25.4 % — ABNORMAL LOW (ref 39.0–52.0)
HEMOGLOBIN: 8.1 g/dL — AB (ref 13.0–17.0)
MCH: 28.8 pg (ref 26.0–34.0)
MCHC: 31.9 g/dL (ref 30.0–36.0)
MCV: 90.4 fL (ref 78.0–100.0)
Platelets: 133 10*3/uL — ABNORMAL LOW (ref 150–400)
RBC: 2.81 MIL/uL — AB (ref 4.22–5.81)
RDW: 14.8 % (ref 11.5–15.5)
WBC: 10.1 10*3/uL (ref 4.0–10.5)

## 2017-09-20 LAB — CULTURE, BLOOD (ROUTINE X 2)

## 2017-09-20 LAB — GLUCOSE, CAPILLARY
GLUCOSE-CAPILLARY: 95 mg/dL (ref 65–99)
GLUCOSE-CAPILLARY: 96 mg/dL (ref 65–99)
GLUCOSE-CAPILLARY: 109 mg/dL — AB (ref 65–99)
Glucose-Capillary: 86 mg/dL (ref 65–99)

## 2017-09-20 MED ORDER — CEFAZOLIN SODIUM-DEXTROSE 1-4 GM/50ML-% IV SOLN
1.0000 g | Freq: Two times a day (BID) | INTRAVENOUS | Status: DC
  Administered 2017-09-20 – 2017-09-22 (×4): 1 g via INTRAVENOUS
  Filled 2017-09-20 (×6): qty 50

## 2017-09-20 NOTE — Plan of Care (Signed)
Problem: Skin Integrity: Goal: Risk for impaired skin integrity will decrease Outcome: Progressing Pt is compliant with being repositioned.

## 2017-09-20 NOTE — Progress Notes (Signed)
Pharmacy Antibiotic Note  Edward [name] is a 83 y.o. male on cefepime for Ecoli bacteremia. Blood cultures and pan-sensitive and pharmacy consulted to change antibiotics to ancef. WBC= 10.1, afeb, SCr= 1.15 and CrCl ~ 30   Plan: -Ancef 1gm IV q12h  -Will follow renal function, cultures and clinical progress   Height: 5' (152.4 cm) Weight: 121 lb 0.5 oz (54.9 kg) IBW/kg (Calculated) : 50  Temp (24hrs), Avg:97.8 F (36.6 C), Min:97.4 F (36.3 C), Max:98.2 F (36.8 C)   Recent Labs Lab 09/17/17 2306 09/17/17 2323 09/18/17 0019 09/18/17 0246 09/18/17 0546 09/18/17 0548 09/19/17 0236 09/20/17 0222 09/20/17 0416  WBC 10.3  --   --   --   --  9.3 8.4  --  10.1  CREATININE 1.27*  --  1.20  --   --  1.04 1.18 1.15  --   LATICACIDVEN  --  3.55*  --  1.41 1.0  --   --   --   --     Estimated Creatinine Clearance: 33.2 mL/min (by C-G formula based on SCr of 1.15 mg/dL).    Antibiotics on this admission: Vancomycin 10/28>>10/28 Unasyn 10/28>>10/28 (one dose given, then broadened) Azithromycin 10/28>>10/28 Cefepime 10/28>> 10/30 Ceftriaxone 10/28>> 10/28 Ancef 10/30>>  Cultures: Blood Cx 10/27>> e coli (pan-S) Blood cx 10/28 - ngtd  Hildred Laser, Pharm D 09/20/2017 1:21 PM

## 2017-09-20 NOTE — Care Management Note (Signed)
Case Management Note  Patient Details  Name: Edward [name] MRN: [id number] Date of Birth: 10-11-1930  Subjective/Objective:          Admitted with encephalopathy/Sepsis, hx of hypertension, hyperlipidemia, early dementia, CAD, and COPD. From home with wife.  Jaccob Czaplicki (Spouse) Scott Urquidi (Son)    [phone]     PCP: [name]  Action/Plan: Discharge planning in process .Marland KitchenMarland KitchenMarland KitchenCM following for disposition needs.  Expected Discharge Date:                  Expected Discharge Plan:  Home/Self Care  In-House Referral:     Discharge planning Services  CM Consult  Post Acute Care Choice:    Choice offered to:     DME Arranged:    DME Agency:     HH Arranged:    HH Agency:     Status of Service:  In process, will continue to follow  If discussed at Long Length of Stay Meetings, dates discussed:    Additional Comments:  Sharin Mons, RN 09/20/2017, 7:32 PM

## 2017-09-20 NOTE — Progress Notes (Signed)
PROGRESS NOTE    Edward [name]  YQI:[id number] DOB: Oct 10, 1930 DOA: 09/17/2017 PCP: [name], MD      Brief Narrative:  83 yo M with COPD, CAD< HTN who presents with encephalopathy, hypotension, sepsis syndrome on 10/28.  Started on vanc/Unasyn initially, broadened on HD1 to Vanc/Cefepime/Azithromycin, cultures growing E coli on 10/28 evening, narrowed to Cefepime.  BP improved with fluids.   Assessment & Plan:  Principal Problem:   Sepsis due to pneumonia [facility]) Active Problems:   COPD (chronic obstructive pulmonary disease) (HCC)   Essential hypertension   CAD (coronary artery disease)   Acute encephalopathy   Mild renal insufficiency   Hyperglycemia   Sepsis from E coli bacteremia 1/1 cultures growing E. Coli, pan sensitive.  Blood pressure improved with fluids, mentation better today.  Suspected source aspiration pneumonia?  Possibly UTI.  No cather in place, CT shows no stones, intraabd abscess or other foci of infection.  If repeat cultures negative, will d/c to SNF with 7-14 day course. -Discontinue cefepime -Start cefazolin -PT eval -Likely to SNF   Septic encephalopathy Encephalopathy from #1 above.  Resolved now.  Bradycardia Chronic.  At baseline.  Monitor on tele.   -Stop donepezil  Hypertension and CAD -Hold ramipril -Continue statin, Plavix  COPD -Continue inhalers, Singulair -Hold PPI while on Abx  Troponin elevation Flat trend.  Type 2 NSTEMI, not ACS.  From demand.    Other medications -Continue sertraline      DVT prophylaxis: Lovenox Code Status: FULL  Family Communication: Wife in law at bedside Disposition Plan: IV fluids and continue IV antibiotics and repeat cultures.  Likely to SNF in 1-2 days.   Consultants:   None  Procedures:   None  Antimicrobials:   Cefepime 10/28 >> 10/30  Cefazolin 10/30 >>   Subjective: Disoriented last night, sundowning.  Today, oriented but still very weak.  No chest pain,  abdominal pain, fever, chills, vomiting or cough.    Objective: Vitals:   09/20/17 0300 09/20/17 0425 09/20/17 0758 09/20/17 1134  BP:  (!) 131/47 140/67 (!) 158/77  Pulse: (!) 57 (!) 56 71 80  Resp: 16 17 18 20   Temp:  (!) 97.4 F (36.3 C) 98.2 F (36.8 C) 97.7 F (36.5 C)  TempSrc:  Oral Oral Oral  SpO2: 98% 95% 95% 97%  Weight:  54.9 kg (121 lb 0.5 oz)    Height:        Intake/Output Summary (Last 24 hours) at 09/20/17 1314 Last data filed at 09/20/17 0428  Gross per 24 hour  Intake          2496.33 ml  Output              630 ml  Net          1866.33 ml   Filed Weights   09/18/17 1416 09/19/17 0517 09/20/17 0425  Weight: 52.1 kg (114 lb 13.8 oz) 48.8 kg (107 lb 9.4 oz) 54.9 kg (121 lb 0.5 oz)    Examination: General appearance: Elderly frail adult male, alert and in no acute distress.    L:ying in bed, listless. Skin: Warm and dry.  No jaundice.  No suspicious rashes or lesions. Cardiac: Slow regular, nl S1-S2, no murmurs appreciated.  Capillary refill is brisk.  No LE edema.  Radial pulses 2+ and symmetric. Respiratory: Normal respiratory rate and rhythm.  CTAB without rales or wheezes. Abdomen: Abdomen soft.  No TTP. No ascites, distension, hepatosplenomegaly.   Neuro: Rousable, sleepy, very  HOH, weak globally.    Psych: Sensorium intact and responding to questions, attention somewhat diminished by hearing. Affect blunted.  Judgment and insight appear normal.    Data Reviewed: I have personally reviewed following labs and imaging studies:  CBC:  Recent Labs Lab 09/17/17 2306 09/18/17 0019 09/18/17 0548 09/19/17 0236 09/20/17 0416  WBC 10.3  --  9.3 8.4 10.1  NEUTROABS  --   --  8.0*  --   --   HGB 11.0* 9.9* 8.1* 8.6* 8.1*  HCT 33.2* 29.0* 24.6* 27.4* 25.4*  MCV 89.0  --  89.8 91.6 90.4  PLT 162  --  109* 116* 010*   Basic Metabolic Panel:  Recent Labs Lab 09/17/17 2306 09/18/17 0019 09/18/17 0548 09/19/17 0236 09/20/17 0222  NA 138 140 138  140 141  K 4.6 5.4* 3.6 4.0 3.8  CL 104 105 111 113* 120*  CO2 22  --  21* 21* 14*  GLUCOSE 239* 198* 146* 154* 124*  BUN 14 22* 12 16 20   CREATININE 1.27* 1.20 1.04 1.18 1.15  CALCIUM 9.0  --  6.7* 7.0* 7.3*   GFR: Estimated Creatinine Clearance: 33.2 mL/min (by C-G formula based on SCr of 1.15 mg/dL). Liver Function Tests:  Recent Labs Lab 09/17/17 2306 09/18/17 0548 09/19/17 0236 09/20/17 0222  AST 45* 34 32 30  ALT 28 26 26 29   ALKPHOS 113 78 89 72  BILITOT 0.9 0.5 0.3 0.5  PROT 6.6 4.6* 5.0* 4.8*  ALBUMIN 4.1 2.7* 2.9* 2.7*   No results for input(s): LIPASE, AMYLASE in the last 168 hours. No results for input(s): AMMONIA in the last 168 hours. Coagulation Profile:  Recent Labs Lab 09/18/17 0548  INR 1.29   Cardiac Enzymes:  Recent Labs Lab 09/18/17 1433 09/18/17 2139  TROPONINI 0.07* 0.06*   BNP (last 3 results) No results for input(s): PROBNP in the last 8760 hours. HbA1C: No results for input(s): HGBA1C in the last 72 hours. CBG:  Recent Labs Lab 09/19/17 1232 09/19/17 1633 09/19/17 2101 09/20/17 0800 09/20/17 1137  GLUCAP 127* 198* 115* 95 96   Lipid Profile: No results for input(s): CHOL, HDL, LDLCALC, TRIG, CHOLHDL, LDLDIRECT in the last 72 hours. Thyroid Function Tests: No results for input(s): TSH, T4TOTAL, FREET4, T3FREE, THYROIDAB in the last 72 hours. Anemia Panel: No results for input(s): VITAMINB12, FOLATE, FERRITIN, TIBC, IRON, RETICCTPCT in the last 72 hours. Urine analysis:    Component Value Date/Time   COLORURINE YELLOW 09/17/2017 2250   APPEARANCEUR CLEAR 09/17/2017 2250   LABSPEC 1.028 09/17/2017 2250   PHURINE 7.0 09/17/2017 2250   GLUCOSEU NEGATIVE 09/17/2017 2250   HGBUR NEGATIVE 09/17/2017 2250   BILIRUBINUR NEGATIVE 09/17/2017 2250   KETONESUR NEGATIVE 09/17/2017 2250   PROTEINUR NEGATIVE 09/17/2017 2250   UROBILINOGEN 1.0 01/24/2012 1216   NITRITE NEGATIVE 09/17/2017 2250   LEUKOCYTESUR NEGATIVE 09/17/2017  2250   Sepsis Labs: @LABRCNTIP (procalcitonin:4,lacticidven:4)  ) Recent Results (from the past 240 hour(s))  Blood Culture (routine x 2)     Status: Abnormal   Collection Time: 09/17/17 10:07 PM  Result Value Ref Range Status   Specimen Description BLOOD LEFT ARM  Final   Special Requests   Final    BOTTLES DRAWN AEROBIC AND ANAEROBIC Blood Culture results may not be optimal due to an excessive volume of blood received in culture bottles   Culture  Setup Time   Final    GRAM NEGATIVE RODS IN BOTH AEROBIC AND ANAEROBIC BOTTLES CRITICAL RESULT CALLED TO, READ  BACK BY AND VERIFIED WITH: B MANCHERIL,PHARMD AT 1319 09/18/17 BY L BENFIELD    Culture ESCHERICHIA COLI (A)  Final   Report Status 09/20/2017 FINAL  Final   Organism ID, Bacteria ESCHERICHIA COLI  Final      Susceptibility   Escherichia coli - MIC*    AMPICILLIN <=2 SENSITIVE Sensitive     CEFAZOLIN <=4 SENSITIVE Sensitive     CEFEPIME <=1 SENSITIVE Sensitive     CEFTAZIDIME <=1 SENSITIVE Sensitive     CEFTRIAXONE <=1 SENSITIVE Sensitive     CIPROFLOXACIN <=0.25 SENSITIVE Sensitive     GENTAMICIN <=1 SENSITIVE Sensitive     IMIPENEM <=0.25 SENSITIVE Sensitive     TRIMETH/SULFA <=20 SENSITIVE Sensitive     AMPICILLIN/SULBACTAM <=2 SENSITIVE Sensitive     PIP/TAZO <=4 SENSITIVE Sensitive     Extended ESBL NEGATIVE Sensitive     * ESCHERICHIA COLI  Blood Culture ID Panel (Reflexed)     Status: Abnormal   Collection Time: 09/17/17 10:07 PM  Result Value Ref Range Status   Enterococcus species NOT DETECTED NOT DETECTED Final   Listeria monocytogenes NOT DETECTED NOT DETECTED Final   Staphylococcus species NOT DETECTED NOT DETECTED Final   Staphylococcus aureus NOT DETECTED NOT DETECTED Final   Streptococcus species NOT DETECTED NOT DETECTED Final   Streptococcus agalactiae NOT DETECTED NOT DETECTED Final   Streptococcus pneumoniae NOT DETECTED NOT DETECTED Final   Streptococcus pyogenes NOT DETECTED NOT DETECTED Final    Acinetobacter baumannii NOT DETECTED NOT DETECTED Final   Enterobacteriaceae species DETECTED (A) NOT DETECTED Final    Comment: Enterobacteriaceae represent a large family of gram-negative bacteria, not a single organism. CRITICAL RESULT CALLED TO, READ BACK BY AND VERIFIED WITH: B MANCHERIL,PHARMD AT 1319 09/18/17 BY L BENFIELD    Enterobacter cloacae complex NOT DETECTED NOT DETECTED Final   Escherichia coli DETECTED (A) NOT DETECTED Final    Comment: CRITICAL RESULT CALLED TO, READ BACK BY AND VERIFIED WITH: B MANCHERIL,PHARMD AT 1319 09/18/17 BY L BENFIELD    Klebsiella oxytoca NOT DETECTED NOT DETECTED Final   Klebsiella pneumoniae NOT DETECTED NOT DETECTED Final   Proteus species NOT DETECTED NOT DETECTED Final   Serratia marcescens NOT DETECTED NOT DETECTED Final   Carbapenem resistance NOT DETECTED NOT DETECTED Final   Haemophilus influenzae NOT DETECTED NOT DETECTED Final   Neisseria meningitidis NOT DETECTED NOT DETECTED Final   Pseudomonas aeruginosa NOT DETECTED NOT DETECTED Final   Candida albicans NOT DETECTED NOT DETECTED Final   Candida glabrata NOT DETECTED NOT DETECTED Final   Candida krusei NOT DETECTED NOT DETECTED Final   Candida parapsilosis NOT DETECTED NOT DETECTED Final   Candida tropicalis NOT DETECTED NOT DETECTED Final  Urine culture     Status: Abnormal   Collection Time: 09/18/17  1:55 AM  Result Value Ref Range Status   Specimen Description URINE, CLEAN CATCH  Final   Special Requests NONE  Final   Culture (A)  Final    30,000 COLONIES/mL DIPHTHEROIDS(CORYNEBACTERIUM SPECIES) Standardized susceptibility testing for this organism is not available.    Report Status 09/19/2017 FINAL  Final  MRSA PCR Screening     Status: None   Collection Time: 09/18/17  2:13 PM  Result Value Ref Range Status   MRSA by PCR NEGATIVE NEGATIVE Final    Comment:        The GeneXpert MRSA Assay (FDA approved for NASAL specimens only), is one component of  a comprehensive MRSA colonization surveillance program. It is not  intended to diagnose MRSA infection nor to guide or monitor treatment for MRSA infections.   Culture, blood (routine x 2)     Status: None (Preliminary result)   Collection Time: 09/18/17  2:30 PM  Result Value Ref Range Status   Specimen Description BLOOD BLOOD LEFT HAND  Final   Special Requests   Final    BOTTLES DRAWN AEROBIC AND ANAEROBIC Blood Culture adequate volume   Culture NO GROWTH < 24 HOURS  Final   Report Status PENDING  Incomplete  Culture, blood (routine x 2)     Status: None (Preliminary result)   Collection Time: 09/18/17  2:37 PM  Result Value Ref Range Status   Specimen Description BLOOD RIGHT ANTECUBITAL  Final   Special Requests   Final    BOTTLES DRAWN AEROBIC ONLY Blood Culture adequate volume   Culture NO GROWTH < 24 HOURS  Final   Report Status PENDING  Incomplete         Radiology Studies: No results found.      Scheduled Meds: . atorvastatin  40 mg Oral q1800  . clopidogrel  75 mg Oral Daily  . enoxaparin (LOVENOX) injection  40 mg Subcutaneous Q24H  . fluticasone  2 spray Each Nare Daily  . insulin aspart  0-5 Units Subcutaneous QHS  . insulin aspart  0-9 Units Subcutaneous TID WC  . metoCLOPramide  5 mg Oral TID AC & HS  . mometasone-formoterol  2 puff Inhalation BID  . montelukast  10 mg Oral QHS  . sertraline  25 mg Oral Daily  . sodium chloride flush  3 mL Intravenous Q12H   Continuous Infusions:    LOS: 2 days    Time spent: 25 minutes    Edwin Dada, MD [facility] Pager [phone]  If 7PM-7AM, please contact night-coverage [email or web address] Password [email or web address] 09/20/2017, 1:14 PM

## 2017-09-21 DIAGNOSIS — N289 Disorder of kidney and ureter, unspecified: Secondary | ICD-10-CM

## 2017-09-21 DIAGNOSIS — G9340 Encephalopathy, unspecified: Secondary | ICD-10-CM

## 2017-09-21 DIAGNOSIS — A419 Sepsis, unspecified organism: Secondary | ICD-10-CM

## 2017-09-21 DIAGNOSIS — R6520 Severe sepsis without septic shock: Secondary | ICD-10-CM

## 2017-09-21 DIAGNOSIS — I10 Essential (primary) hypertension: Secondary | ICD-10-CM

## 2017-09-21 DIAGNOSIS — J181 Lobar pneumonia, unspecified organism: Secondary | ICD-10-CM

## 2017-09-21 LAB — CBC
HCT: 27.8 % — ABNORMAL LOW (ref 39.0–52.0)
Hemoglobin: 9 g/dL — ABNORMAL LOW (ref 13.0–17.0)
MCH: 29 pg (ref 26.0–34.0)
MCHC: 32.4 g/dL (ref 30.0–36.0)
MCV: 89.7 fL (ref 78.0–100.0)
Platelets: 144 10*3/uL — ABNORMAL LOW (ref 150–400)
RBC: 3.1 MIL/uL — AB (ref 4.22–5.81)
RDW: 14.5 % (ref 11.5–15.5)
WBC: 9.5 10*3/uL (ref 4.0–10.5)

## 2017-09-21 LAB — IRON AND TIBC
Iron: 11 ug/dL — ABNORMAL LOW (ref 45–182)
SATURATION RATIOS: 5 % — AB (ref 17.9–39.5)
TIBC: 232 ug/dL — AB (ref 250–450)
UIBC: 221 ug/dL

## 2017-09-21 LAB — BASIC METABOLIC PANEL
Anion gap: 9 (ref 5–15)
BUN: 11 mg/dL (ref 6–20)
CALCIUM: 7.6 mg/dL — AB (ref 8.9–10.3)
CO2: 18 mmol/L — ABNORMAL LOW (ref 22–32)
CREATININE: 1.08 mg/dL (ref 0.61–1.24)
Chloride: 113 mmol/L — ABNORMAL HIGH (ref 101–111)
GFR calc non Af Amer: >60 mL/min (ref >60)
Glucose, Bld: 96 mg/dL (ref 65–99)
Potassium: 3.6 mmol/L (ref 3.5–5.1)
SODIUM: 140 mmol/L (ref 135–145)

## 2017-09-21 LAB — GLUCOSE, CAPILLARY
GLUCOSE-CAPILLARY: 92 mg/dL (ref 65–99)
Glucose-Capillary: 102 mg/dL — ABNORMAL HIGH (ref 65–99)
Glucose-Capillary: 92 mg/dL (ref 65–99)
Glucose-Capillary: 99 mg/dL (ref 65–99)

## 2017-09-21 LAB — FERRITIN: FERRITIN: 56 ng/mL (ref 24–336)

## 2017-09-21 NOTE — Progress Notes (Signed)
Pt fed with pudding and orange juice due to CBG being 86, will continue to monitor pt

## 2017-09-21 NOTE — Progress Notes (Signed)
OT Cancellation Note  Patient Details Name: Edward [name] MRN: [id number] DOB: August 11, 1930   Cancelled Treatment:    Reason Eval/Treat Not Completed: Medical issues which prohibited therapy. Pt is having missed heartbeats, PAC's, long QTC and SB in the 40's. RN awaiting MD response, will hold eval and see as schedule allows.   [place] 09/21/2017, 9:18 AM  Hulda Humphrey OTR/L [phone]

## 2017-09-21 NOTE — Progress Notes (Signed)
Received call from tele informing patient having missed beats, PAC's, long QTC and SB in the 40's. Informed [place], RN for patient.

## 2017-09-21 NOTE — Evaluation (Addendum)
Physical Therapy Evaluation Patient Details Name: Edward [name] MRN: [id number] DOB: Jan 01, 1930 Today's Date: 09/21/2017   History of Present Illness  Edward [name] is a 83 y.o. male with medical history significant for hypertension, hyperlipidemia, early dementia, CAD, and COPD, now presenting to the emergency department with fevers, chills, confusion, and productive cough, cultures showed e coli bacteremia.  Clinical Impression  Pt admitted with/for complications above.  Presently, pt needing minimal assist for basic mobility and/or gait.  Pt currently limited functionally due to the problems listed below.  (see problems list.) Pt will benefit from PT to prepare pt for next venue of care.  Pt's family does not feel they can care for pt at his present level of function.    Follow Up Recommendations SNF;Supervision/Assistance - 24 hour    Equipment Recommendations  None recommended by PT    Recommendations for Other Services       Precautions / Restrictions Precautions Precautions: Fall      Mobility  Bed Mobility Overal bed mobility: Needs Assistance Bed Mobility: Sit to Supine;Supine to Sit     Supine to sit: Min assist Sit to supine: Min guard   General bed mobility comments: needed truncal assist to get up to EOB  Transfers Overall transfer level: Needs assistance   Transfers: Sit to/from Stand Sit to Stand: Min guard            Ambulation/Gait Ambulation/Gait assistance: Min assist Ambulation Distance (Feet): 20 Feet Assistive device: 1 person hand held assist Gait Pattern/deviations: Step-through pattern   Gait velocity interpretation: Below normal speed for age/gender General Gait Details: mildly unsteady overall  Stairs            Wheelchair Mobility    Modified Rankin (Stroke Patients Only)       Balance Overall balance assessment: Needs assistance Sitting-balance support: No upper extremity supported Sitting balance-Leahy Scale:  Good     Standing balance support: Single extremity supported Standing balance-Leahy Scale: Poor Standing balance comment: needing external support for balance at EOB while taking standing BP                             Pertinent Vitals/Pain Pain Assessment: Faces Faces Pain Scale: Hurts a little bit Pain Location: left leg Pain Descriptors / Indicators: Sore Pain Intervention(s): Monitored during session    Home Living Family/patient expects to be discharged to:: Private residence Living Arrangements: Spouse/significant other Available Help at Discharge: Family;Available 24 hours/day Type of Home: House Home Access: Stairs to enter Entrance Stairs-Rails: Psychiatric nurse of Steps: 3 Home Layout: One level Home Equipment: Walker - 2 wheels;Cane - single point;Bedside commode      Prior Function Level of Independence: Needs assistance   Gait / Transfers Assistance Needed: generally independent in the home ambulating without an AD  ADL's / Homemaking Assistance Needed: manages most of his ADL's--not specified        Hand Dominance        Extremity/Trunk Assessment   Upper Extremity Assessment Upper Extremity Assessment: Overall WFL for tasks assessed;Generalized weakness    Lower Extremity Assessment Lower Extremity Assessment: Overall WFL for tasks assessed;LLE deficits/detail LLE Deficits / Details: mild proximal weakness, ankle and leg pain could be contributory.       Communication   Communication: No difficulties  Cognition Arousal/Alertness: Awake/alert Behavior During Therapy: WFL for tasks assessed/performed Overall Cognitive Status: Impaired/Different from baseline  General Comments General comments (skin integrity, edema, etc.): Sats at rest 94%, rest HR 84 bpm,  BP sitting 122/61, standing 137/51.  mildly symptomatic, but improved with time up.    Exercises      Assessment/Plan    PT Assessment Patient needs continued PT services  PT Problem List Decreased strength;Decreased activity tolerance;Decreased balance;Decreased mobility;Cardiopulmonary status limiting activity       PT Treatment Interventions Gait training;Functional mobility training;Therapeutic activities;Balance training;Patient/family education    PT Goals (Current goals can be found in the Care Plan section)  Acute Rehab PT Goals Patient Stated Goal: back able to as much for himself as possible PT Goal Formulation: With patient/family Time For Goal Achievement: 10/05/17 Potential to Achieve Goals: Good    Frequency Min 3X/week   Barriers to discharge        Co-evaluation               AM-PAC PT "6 Clicks" Daily Activity  Outcome Measure Difficulty turning over in bed (including adjusting bedclothes, sheets and blankets)?: A Little Difficulty moving from lying on back to sitting on the side of the bed? : Unable Difficulty sitting down on and standing up from a chair with arms (e.g., wheelchair, bedside commode, etc,.)?: A Little Help needed moving to and from a bed to chair (including a wheelchair)?: A Little Help needed walking in hospital room?: A Little Help needed climbing 3-5 steps with a railing? : A Little 6 Click Score: 16    End of Session   Activity Tolerance: Patient tolerated treatment well Patient left: in chair;with call bell/phone within reach;with chair alarm set Nurse Communication: Mobility status PT Visit Diagnosis: Unsteadiness on feet (R26.81)    Time: 5830-9407 PT Time Calculation (min) (ACUTE ONLY): 28 min   Charges:   PT Evaluation $PT Eval Moderate Complexity: 1 Mod PT Treatments $Therapeutic Activity: 8-22 mins   PT G Codes:        2017/09/23  Donnella Sham, PT [phone]  (pager)  Tessie Fass Sundae Maners 09-23-2017, 3:52 PM

## 2017-09-21 NOTE — Progress Notes (Signed)
PROGRESS NOTE    Edward [name]   ZSW:[id number]  DOB: 11/19/1930  DOA: 09/17/2017 PCP: [name], MD   Brief Narrative:  Edward [name] 83 yo M with COPD, CAD, HTN dementia, poor hearing who presents with encephalopathy, hypotension, sepsis syndrome on 10/28.  Started on vanc/Unasyn initially, broadened on HD1 to Vanc/Cefepime/Azithromycin, cultures growing E coli on 10/28 evening, narrowed to Cefepime.  BP improved with fluids.   Subjective: No complaints today. Wife feels he is restless.  ROS: no complaints of nausea, vomiting, constipation diarrhea, cough, dyspnea or dysuria. No other complaints.   Assessment & Plan:   Severe Sepsis from E coli bacteremia - 1/1 cultures growing E. Coli, pan sensitive- - repeat cultures negative  -  aspiration pneumonia  vs  UTI suspected as source- U culture actually has grown 30,000 colonies of diptheroids -  CT abd/pelvis shows "mild bladder wall thickening could reflect cystitis. Likely small mucus plug at the right middle lung lobe. Minimal peripheral opacity at the right lower lobe may reflect mild infection. - switched from cefepime to cefazolin on 10/30  to which the E coli is sensitive    - noted to have a cough with mild congestion- not hypoxic  Septic encephalopathy Encephalopathy from #1 above.  Resolved now. -has underlyign dementia  Bradycardia -with HR in 40-60s - donepezil was stopped and HR has improved to 70s  Mod Aortic regurg, mild stenosis/ Inf-Lat WMA Grade 2 dCHF- chronic - chronic and noted on prior ECHOs  Hypertension and CAD -Holding ramipril- can resume today as BP elevated -Continue statin, Plavix  COPD -Continue inhalers, Singulair -Hold PPI while on Abx  Troponin elevation - 0.06 > 0.07- Flat trend - ECHO shows WMA   Other medications -Continue sertraline   DVT prophylaxis: Lovenox Code Status: full code Family Communication: wife Disposition Plan: SNF Consultants:    none Procedures:   2 D ECHO Study Conclusions  - Left ventricle: The cavity size was normal. Systolic function was   normal. The estimated ejection fraction was in the range of 55%   to 60%. There is hypokinesis of the inferior myocardium. - Aortic valve: Trileaflet; moderately thickened, moderately   calcified leaflets. There was mild stenosis. There was moderate   regurgitation. Peak velocity (S): 213 cm/s. Mean gradient (S): 11   mm Hg. - Mitral valve: There was mild regurgitation. - Pulmonary arteries: PA peak pressure: 40 mm Hg (S). Antimicrobials:  Anti-infectives    Start     Dose/Rate Route Frequency Ordered Stop   09/20/17 1430  ceFAZolin (ANCEF) IVPB 1 g/50 mL premix     1 g 100 mL/hr over 30 Minutes Intravenous Every 12 hours 09/20/17 1323     09/20/17 0600  vancomycin (VANCOCIN) IVPB 750 mg/150 ml premix  Status:  Discontinued     750 mg 150 mL/hr over 60 Minutes Intravenous Every 48 hours 09/18/17 0515 09/18/17 1348   09/19/17 1000  azithromycin (ZITHROMAX) 500 mg in dextrose 5 % 250 mL IVPB  Status:  Discontinued     500 mg 250 mL/hr over 60 Minutes Intravenous Every 24 hours 09/18/17 1124 09/18/17 1348   09/18/17 1200  ceFEPIme (MAXIPIME) 1 g in dextrose 5 % 50 mL IVPB  Status:  Discontinued     1 g 100 mL/hr over 30 Minutes Intravenous Every 24 hours 09/18/17 1138 09/20/17 1314   09/18/17 0600  ampicillin-sulbactam (UNASYN) 1.5 g in sodium chloride 0.9 % 50 mL IVPB  Status:  Discontinued  1.5 g 100 mL/hr over 30 Minutes Intravenous Every 8 hours 09/18/17 0515 09/18/17 1124   09/18/17 0530  vancomycin (VANCOCIN) IVPB 1000 mg/200 mL premix     1,000 mg 200 mL/hr over 60 Minutes Intravenous  Once 09/18/17 0501 09/18/17 0843   09/18/17 0245  cefTRIAXone (ROCEPHIN) 1 g in dextrose 5 % 50 mL IVPB     1 g 100 mL/hr over 30 Minutes Intravenous  Once 09/18/17 0231 09/18/17 0335   09/18/17 0245  azithromycin (ZITHROMAX) 500 mg in dextrose 5 % 250 mL IVPB     500  mg 250 mL/hr over 60 Minutes Intravenous  Once 09/18/17 0231 09/18/17 0740       Objective: Vitals:   09/21/17 0735 09/21/17 0930 09/21/17 1115 09/21/17 1330  BP:  (!) 147/66  (!) 142/58  Pulse:  80  72  Resp:  20  16  Temp:  98.7 F (37.1 C)  (!) 97.5 F (36.4 C)  TempSrc:  Oral  Oral  SpO2: 94% 95% 95% 94%  Weight:      Height:        Intake/Output Summary (Last 24 hours) at 09/21/17 1418 Last data filed at 09/21/17 1300  Gross per 24 hour  Intake              270 ml  Output             1800 ml  Net            -1530 ml   Filed Weights   09/20/17 0425 09/20/17 1833 09/21/17 0555  Weight: 54.9 kg (121 lb 0.5 oz) 57.6 kg (126 lb 15.8 oz) 56.1 kg (123 lb 10.9 oz)    Examination: General exam: Appears comfortable  HEENT: PERRLA, oral mucosa moist, no sclera icterus or thrush Respiratory system: Clear to auscultation. Respiratory effort normal. Cardiovascular system: S1 & S2 heard, RRR.  2/6 murmur at RUS border  Gastrointestinal system: Abdomen soft, non-tender, nondistended. Normal bowel sound. No organomegaly Central nervous system: Alert. No focal neurological deficits. Extremities: No cyanosis, clubbing or edema Skin: No rashes or ulcers Psychiatry:  Mood & affect appropriate.     Data Reviewed: I have personally reviewed following labs and imaging studies  CBC:  Recent Labs Lab 09/17/17 2306 09/18/17 0019 09/18/17 0548 09/19/17 0236 09/20/17 0416 09/21/17 0715  WBC 10.3  --  9.3 8.4 10.1 9.5  NEUTROABS  --   --  8.0*  --   --   --   HGB 11.0* 9.9* 8.1* 8.6* 8.1* 9.0*  HCT 33.2* 29.0* 24.6* 27.4* 25.4* 27.8*  MCV 89.0  --  89.8 91.6 90.4 89.7  PLT 162  --  109* 116* 133* 962*   Basic Metabolic Panel:  Recent Labs Lab 09/17/17 2306 09/18/17 0019 09/18/17 0548 09/19/17 0236 09/20/17 0222 09/21/17 0715  NA 138 140 138 140 141 140  K 4.6 5.4* 3.6 4.0 3.8 3.6  CL 104 105 111 113* 120* 113*  CO2 22  --  21* 21* 14* 18*  GLUCOSE 239* 198* 146*  154* 124* 96  BUN 14 22* 12 16 20 11   CREATININE 1.27* 1.20 1.04 1.18 1.15 1.08  CALCIUM 9.0  --  6.7* 7.0* 7.3* 7.6*   GFR: Estimated Creatinine Clearance: 37 mL/min (by C-G formula based on SCr of 1.08 mg/dL). Liver Function Tests:  Recent Labs Lab 09/17/17 2306 09/18/17 0548 09/19/17 0236 09/20/17 0222  AST 45* 34 32 30  ALT 28 26 26  29  ALKPHOS 113 78 89 72  BILITOT 0.9 0.5 0.3 0.5  PROT 6.6 4.6* 5.0* 4.8*  ALBUMIN 4.1 2.7* 2.9* 2.7*   No results for input(s): LIPASE, AMYLASE in the last 168 hours. No results for input(s): AMMONIA in the last 168 hours. Coagulation Profile:  Recent Labs Lab 09/18/17 0548  INR 1.29   Cardiac Enzymes:  Recent Labs Lab 09/18/17 1433 09/18/17 2139  TROPONINI 0.07* 0.06*   BNP (last 3 results) No results for input(s): PROBNP in the last 8760 hours. HbA1C: No results for input(s): HGBA1C in the last 72 hours. CBG:  Recent Labs Lab 09/20/17 1137 09/20/17 1618 09/20/17 2137 09/21/17 0809 09/21/17 1308  GLUCAP 96 109* 86 102* 92   Lipid Profile: No results for input(s): CHOL, HDL, LDLCALC, TRIG, CHOLHDL, LDLDIRECT in the last 72 hours. Thyroid Function Tests: No results for input(s): TSH, T4TOTAL, FREET4, T3FREE, THYROIDAB in the last 72 hours. Anemia Panel:  Recent Labs  09/21/17 0715  FERRITIN 56  TIBC 232*  IRON 11*   Urine analysis:    Component Value Date/Time   COLORURINE YELLOW 09/17/2017 2250   APPEARANCEUR CLEAR 09/17/2017 2250   LABSPEC 1.028 09/17/2017 2250   PHURINE 7.0 09/17/2017 2250   GLUCOSEU NEGATIVE 09/17/2017 2250   HGBUR NEGATIVE 09/17/2017 2250   BILIRUBINUR NEGATIVE 09/17/2017 2250   KETONESUR NEGATIVE 09/17/2017 2250   PROTEINUR NEGATIVE 09/17/2017 2250   UROBILINOGEN 1.0 01/24/2012 1216   NITRITE NEGATIVE 09/17/2017 2250   LEUKOCYTESUR NEGATIVE 09/17/2017 2250   Sepsis Labs: @LABRCNTIP (procalcitonin:4,lacticidven:4) ) Recent Results (from the past 240 hour(s))  Blood Culture  (routine x 2)     Status: Abnormal   Collection Time: 09/17/17 10:07 PM  Result Value Ref Range Status   Specimen Description BLOOD LEFT ARM  Final   Special Requests   Final    BOTTLES DRAWN AEROBIC AND ANAEROBIC Blood Culture results may not be optimal due to an excessive volume of blood received in culture bottles   Culture  Setup Time   Final    GRAM NEGATIVE RODS IN BOTH AEROBIC AND ANAEROBIC BOTTLES CRITICAL RESULT CALLED TO, READ BACK BY AND VERIFIED WITH: B MANCHERIL,PHARMD AT 1319 09/18/17 BY L BENFIELD    Culture ESCHERICHIA COLI (A)  Final   Report Status 09/20/2017 FINAL  Final   Organism ID, Bacteria ESCHERICHIA COLI  Final      Susceptibility   Escherichia coli - MIC*    AMPICILLIN <=2 SENSITIVE Sensitive     CEFAZOLIN <=4 SENSITIVE Sensitive     CEFEPIME <=1 SENSITIVE Sensitive     CEFTAZIDIME <=1 SENSITIVE Sensitive     CEFTRIAXONE <=1 SENSITIVE Sensitive     CIPROFLOXACIN <=0.25 SENSITIVE Sensitive     GENTAMICIN <=1 SENSITIVE Sensitive     IMIPENEM <=0.25 SENSITIVE Sensitive     TRIMETH/SULFA <=20 SENSITIVE Sensitive     AMPICILLIN/SULBACTAM <=2 SENSITIVE Sensitive     PIP/TAZO <=4 SENSITIVE Sensitive     Extended ESBL NEGATIVE Sensitive     * ESCHERICHIA COLI  Blood Culture ID Panel (Reflexed)     Status: Abnormal   Collection Time: 09/17/17 10:07 PM  Result Value Ref Range Status   Enterococcus species NOT DETECTED NOT DETECTED Final   Listeria monocytogenes NOT DETECTED NOT DETECTED Final   Staphylococcus species NOT DETECTED NOT DETECTED Final   Staphylococcus aureus NOT DETECTED NOT DETECTED Final   Streptococcus species NOT DETECTED NOT DETECTED Final   Streptococcus agalactiae NOT DETECTED NOT DETECTED Final   Streptococcus pneumoniae  NOT DETECTED NOT DETECTED Final   Streptococcus pyogenes NOT DETECTED NOT DETECTED Final   Acinetobacter baumannii NOT DETECTED NOT DETECTED Final   Enterobacteriaceae species DETECTED (A) NOT DETECTED Final    Comment:  Enterobacteriaceae represent a large family of gram-negative bacteria, not a single organism. CRITICAL RESULT CALLED TO, READ BACK BY AND VERIFIED WITH: B MANCHERIL,PHARMD AT 1319 09/18/17 BY L BENFIELD    Enterobacter cloacae complex NOT DETECTED NOT DETECTED Final   Escherichia coli DETECTED (A) NOT DETECTED Final    Comment: CRITICAL RESULT CALLED TO, READ BACK BY AND VERIFIED WITH: B MANCHERIL,PHARMD AT 1319 09/18/17 BY L BENFIELD    Klebsiella oxytoca NOT DETECTED NOT DETECTED Final   Klebsiella pneumoniae NOT DETECTED NOT DETECTED Final   Proteus species NOT DETECTED NOT DETECTED Final   Serratia marcescens NOT DETECTED NOT DETECTED Final   Carbapenem resistance NOT DETECTED NOT DETECTED Final   Haemophilus influenzae NOT DETECTED NOT DETECTED Final   Neisseria meningitidis NOT DETECTED NOT DETECTED Final   Pseudomonas aeruginosa NOT DETECTED NOT DETECTED Final   Candida albicans NOT DETECTED NOT DETECTED Final   Candida glabrata NOT DETECTED NOT DETECTED Final   Candida krusei NOT DETECTED NOT DETECTED Final   Candida parapsilosis NOT DETECTED NOT DETECTED Final   Candida tropicalis NOT DETECTED NOT DETECTED Final  Urine culture     Status: Abnormal   Collection Time: 09/18/17  1:55 AM  Result Value Ref Range Status   Specimen Description URINE, CLEAN CATCH  Final   Special Requests NONE  Final   Culture (A)  Final    30,000 COLONIES/mL DIPHTHEROIDS(CORYNEBACTERIUM SPECIES) Standardized susceptibility testing for this organism is not available.    Report Status 09/19/2017 FINAL  Final  MRSA PCR Screening     Status: None   Collection Time: 09/18/17  2:13 PM  Result Value Ref Range Status   MRSA by PCR NEGATIVE NEGATIVE Final    Comment:        The GeneXpert MRSA Assay (FDA approved for NASAL specimens only), is one component of a comprehensive MRSA colonization surveillance program. It is not intended to diagnose MRSA infection nor to guide or monitor treatment  for MRSA infections.   Culture, blood (routine x 2)     Status: None (Preliminary result)   Collection Time: 09/18/17  2:30 PM  Result Value Ref Range Status   Specimen Description BLOOD BLOOD LEFT HAND  Final   Special Requests   Final    BOTTLES DRAWN AEROBIC AND ANAEROBIC Blood Culture adequate volume   Culture NO GROWTH 3 DAYS  Final   Report Status PENDING  Incomplete  Culture, blood (routine x 2)     Status: None (Preliminary result)   Collection Time: 09/18/17  2:37 PM  Result Value Ref Range Status   Specimen Description BLOOD RIGHT ANTECUBITAL  Final   Special Requests   Final    BOTTLES DRAWN AEROBIC ONLY Blood Culture adequate volume   Culture NO GROWTH 3 DAYS  Final   Report Status PENDING  Incomplete         Radiology Studies: No results found.    Scheduled Meds: . atorvastatin  40 mg Oral q1800  . clopidogrel  75 mg Oral Daily  . enoxaparin (LOVENOX) injection  40 mg Subcutaneous Q24H  . fluticasone  2 spray Each Nare Daily  . insulin aspart  0-5 Units Subcutaneous QHS  . insulin aspart  0-9 Units Subcutaneous TID WC  . metoCLOPramide  5 mg  Oral TID AC & HS  . mometasone-formoterol  2 puff Inhalation BID  . montelukast  10 mg Oral QHS  . sertraline  25 mg Oral Daily  . sodium chloride flush  3 mL Intravenous Q12H   Continuous Infusions: .  ceFAZolin (ANCEF) IV 1 g (09/21/17 1336)     LOS: 3 days    Time spent in minutes: 35    Debbe Odea, MD [facility] Pager: [email or web address] Password [email or web address] 09/21/2017, 2:18 PM

## 2017-09-22 DIAGNOSIS — J189 Pneumonia, unspecified organism: Secondary | ICD-10-CM

## 2017-09-22 DIAGNOSIS — I2583 Coronary atherosclerosis due to lipid rich plaque: Secondary | ICD-10-CM

## 2017-09-22 DIAGNOSIS — I359 Nonrheumatic aortic valve disorder, unspecified: Secondary | ICD-10-CM

## 2017-09-22 DIAGNOSIS — R001 Bradycardia, unspecified: Secondary | ICD-10-CM

## 2017-09-22 DIAGNOSIS — R4182 Altered mental status, unspecified: Secondary | ICD-10-CM

## 2017-09-22 DIAGNOSIS — I5032 Chronic diastolic (congestive) heart failure: Secondary | ICD-10-CM

## 2017-09-22 DIAGNOSIS — R413 Other amnesia: Secondary | ICD-10-CM

## 2017-09-22 DIAGNOSIS — J449 Chronic obstructive pulmonary disease, unspecified: Secondary | ICD-10-CM

## 2017-09-22 DIAGNOSIS — I2510 Atherosclerotic heart disease of native coronary artery without angina pectoris: Secondary | ICD-10-CM

## 2017-09-22 LAB — BASIC METABOLIC PANEL
ANION GAP: 10 (ref 5–15)
BUN: 8 mg/dL (ref 6–20)
CO2: 19 mmol/L — AB (ref 22–32)
Calcium: 7.4 mg/dL — ABNORMAL LOW (ref 8.9–10.3)
Chloride: 112 mmol/L — ABNORMAL HIGH (ref 101–111)
Creatinine, Ser: 0.98 mg/dL (ref 0.61–1.24)
GFR calc Af Amer: >60 mL/min (ref >60)
GFR calc non Af Amer: >60 mL/min (ref >60)
GLUCOSE: 87 mg/dL (ref 65–99)
POTASSIUM: 3.2 mmol/L — AB (ref 3.5–5.1)
Sodium: 141 mmol/L (ref 135–145)

## 2017-09-22 LAB — MAGNESIUM: MAGNESIUM: 1.4 mg/dL — AB (ref 1.7–2.4)

## 2017-09-22 LAB — GLUCOSE, CAPILLARY
Glucose-Capillary: 87 mg/dL (ref 65–99)
Glucose-Capillary: 114 mg/dL — ABNORMAL HIGH (ref 65–99)

## 2017-09-22 MED ORDER — ACETAMINOPHEN 325 MG PO TABS: 650.0000 mg | ORAL_TABLET | Freq: Four times a day (QID) | ORAL | Status: AC | PRN

## 2017-09-22 MED ORDER — POTASSIUM CHLORIDE CRYS ER 20 MEQ PO TBCR
40.0000 meq | EXTENDED_RELEASE_TABLET | ORAL | Status: AC
  Administered 2017-09-22 (×2): 40 meq via ORAL
  Filled 2017-09-22 (×2): qty 2

## 2017-09-22 MED ORDER — MAGNESIUM SULFATE 2 GM/50ML IV SOLN
2.0000 g | Freq: Once | INTRAVENOUS | Status: AC
  Administered 2017-09-22: 2 g via INTRAVENOUS
  Filled 2017-09-22: qty 50

## 2017-09-22 MED ORDER — BISACODYL 5 MG PO TBEC: 5.0000 mg | DELAYED_RELEASE_TABLET | Freq: Every day | ORAL | 0 refills | Status: DC | PRN

## 2017-09-22 MED ORDER — CEPHALEXIN 500 MG PO CAPS: 500.0000 mg | ORAL_CAPSULE | Freq: Two times a day (BID) | ORAL | 0 refills | Status: DC

## 2017-09-22 MED ORDER — CEPHALEXIN 500 MG PO CAPS
500.0000 mg | ORAL_CAPSULE | Freq: Two times a day (BID) | ORAL | Status: DC
  Administered 2017-09-22: 500 mg via ORAL
  Filled 2017-09-22: qty 1

## 2017-09-22 MED ORDER — MAGNESIUM OXIDE 400 (241.3 MG) MG PO TABS
400.0000 mg | ORAL_TABLET | Freq: Two times a day (BID) | ORAL | Status: DC
  Administered 2017-09-22: 400 mg via ORAL
  Filled 2017-09-22: qty 1

## 2017-09-22 MED ORDER — SENNOSIDES-DOCUSATE SODIUM 8.6-50 MG PO TABS: 1.0000 | ORAL_TABLET | Freq: Every evening | ORAL | Status: DC | PRN

## 2017-09-22 MED ORDER — MAGNESIUM OXIDE 400 (241.3 MG) MG PO TABS: 400.0000 mg | ORAL_TABLET | Freq: Every day | ORAL | 0 refills | Status: DC

## 2017-09-22 NOTE — Clinical Social Work Placement (Signed)
   CLINICAL SOCIAL WORK PLACEMENT  NOTE  Date:  09/22/2017  Patient Details  Name: Edward [name] MRN: [id number] Date of Birth: 12/16/1929  Clinical Social Work is seeking post-discharge placement for this patient at the [place] level of care (*CSW will initial, date and re-position this form in  chart as items are completed):  Yes   Patient/family provided with [place] Work Department's list of facilities offering this level of care within the geographic area requested by the patient (or if unable, by the patient's family).  Yes   Patient/family informed of their freedom to choose among providers that offer the needed level of care, that participate in Medicare, Medicaid or managed care program needed by the patient, have an available bed and are willing to accept the patient.  Yes   Patient/family informed of [place]'s ownership interest in [facility] and [facility], as well as of the fact that they are under no obligation to receive care at these facilities.  PASRR submitted to EDS on 09/22/17     PASRR number received on 09/22/17     Existing PASRR number confirmed on       FL2 transmitted to all facilities in geographic area requested by pt/family on 09/22/17     FL2 transmitted to all facilities within larger geographic area on       Patient informed that his/her managed care company has contracts with or will negotiate with certain facilities, including the following:        Yes   Patient/family informed of bed offers received.  Patient chooses bed at [facility]     Physician recommends and patient chooses bed at      Patient to be transferred to [facility] on 09/22/17.  Patient to be transferred to facility by [facility]     Patient family notified on 09/22/17 of transfer.  Name of family member notified:  Spouse     PHYSICIAN Please sign FL2     Additional Comment:     _______________________________________________ Benard Halsted, [place] 09/22/2017, 2:54 PM

## 2017-09-22 NOTE — Care Management Note (Signed)
Case Management Note  Patient Details  Name: Edward [name] MRN: [id number] Date of Birth: [birth year]  Subjective/Objective:             Admitted with Sepsis/ E.coli bacteremia.       Action/Plan: Plan is to d/c to SNF today. CSW managing disposition to SNF.  Expected Discharge Date:  09/22/17               Expected Discharge Plan:  Home/Self Care  In-House Referral:  Clinical Social Work  Discharge planning Services  CM Consult    Status of Service:  Completed, signed off  If discussed at [employer] of Stay Meetings, dates discussed:    Additional Comments:  Sharin Mons, RN 09/22/2017, 11:37 AM

## 2017-09-22 NOTE — Evaluation (Signed)
Occupational Therapy Evaluation Patient Details Name: Edward [name] MRN: [id number] DOB: January 16, 1930 Today's Date: 09/22/2017    History of Present Illness Edward [name] is a 83 y.o. male with medical history significant for hypertension, hyperlipidemia, early dementia, CAD, and COPD, now presenting to the emergency department with fevers, chills, confusion, and productive cough, cultures showed e coli bacteremia.   Clinical Impression   PTA, pt was living with his wife and performing BADLs. Pt currently requiring Min A for LB ADLs and Min Guard A for functional mobility. Pt presenting with decreased strength and cognition.Pt would benefit form further acute OT to facilitate safe dc. Family concerned about caregiver burden; discussed with pt and family (wife and son) pt's functional performance and dc options.  Recommend dc home with HHOT, HHPT, and [place] aide to increase safety and independence with ADLs as well as decrease caregiver burden.     Follow Up Recommendations  Home health OT;Supervision/Assistance - 24 hour [facility] 2 aide)    Equipment Recommendations  None recommended by OT    Recommendations for Other Services PT consult     Precautions / Restrictions Precautions Precautions: Fall Restrictions Weight Bearing Restrictions: No      Mobility Bed Mobility Overal bed mobility: Needs Assistance Bed Mobility: Sit to Supine;Supine to Sit     Supine to sit: Min guard Sit to supine: Min guard   General bed mobility comments: HOB lowered. Pt used of bed rails to pull towards EOB  Transfers Overall transfer level: Needs assistance Equipment used: None Transfers: Sit to/from Stand Sit to Stand: Min guard         General transfer comment: Min Guard A for safety    Balance Overall balance assessment: Needs assistance Sitting-balance support: No upper extremity supported Sitting balance-Leahy Scale: Good     Standing balance support: Single extremity supported Standing  balance-Leahy Scale: Fair Standing balance comment: Maintains standing balance without UE support                           ADL either performed or assessed with clinical judgement   ADL Overall ADL's : Needs assistance/impaired Eating/Feeding: Set up;Sitting   Grooming: Oral care;Wash/dry hands;Min guard;Standing   Upper Body Bathing: Min guard;Sitting   Lower Body Bathing: Min guard;Sit to/from stand   Upper Body Dressing : Set up;Sitting   Lower Body Dressing: Sit to/from stand;Minimal assistance Lower Body Dressing Details (indicate cue type and reason): Able to adjust socks by bringing foot upward and flexing at hip; pt maintaining sitting balance with slight posterior lean Toilet Transfer: Min guard;Ambulation   Toileting- Clothing Manipulation and Hygiene: Minimal assistance;Sit to/from stand       Functional mobility during ADLs: Min guard General ADL Comments: Pt performing ADLs at [place] guard level with additional support for toilet hygiene.      Vision         Perception     Praxis      Pertinent Vitals/Pain Pain Assessment: Faces Faces Pain Scale: Hurts a little bit Pain Location: left leg Pain Descriptors / Indicators: Sore Pain Intervention(s): Monitored during session     Hand Dominance Right   Extremity/Trunk Assessment Upper Extremity Assessment Upper Extremity Assessment: Overall WFL for tasks assessed   Lower Extremity Assessment Lower Extremity Assessment: Overall WFL for tasks assessed LLE Deficits / Details: mild proximal weakness, ankle and leg pain could be contributory.   Cervical / Trunk Assessment Cervical / Trunk Assessment: Kyphotic  Communication Communication Communication: No difficulties   Cognition Arousal/Alertness: Awake/alert Behavior During Therapy: WFL for tasks assessed/performed Overall Cognitive Status: Impaired/Different from baseline Area of Impairment: Memory;Following commands;Problem solving                      Memory: Decreased short-term memory Following Commands: Follows one step commands with increased time     Problem Solving: Slow processing;Requires verbal cues General Comments: Requring increased time. Pt having BM in bed and unaware of it. Needed cues to clean himself   General Comments  Pt's wife and son present throughout session. Family voicing concern about needed support at home before dc    Exercises     Shoulder Instructions      Home Living Family/patient expects to be discharged to:: Private residence Living Arrangements: Spouse/significant other Available Help at Discharge: Family;Available 24 hours/day Type of Home: House Home Access: Stairs to enter [employer] of Steps: 3 Entrance Stairs-Rails: Right;Left Home Layout: One level     Bathroom Shower/Tub: Occupational psychologist: Standard     Home Equipment: Environmental consultant - 2 wheels;Cane - single point;Bedside commode;Shower seat          Prior Functioning/Environment Level of Independence: Needs assistance  Gait / Transfers Assistance Needed: generally independent in the home ambulating without an AD ADL's / Homemaking Assistance Needed: manages most of his ADL's--not specified            OT Problem List: Decreased strength;Impaired balance (sitting and/or standing);Decreased knowledge of use of DME or AE;Decreased cognition      OT Treatment/Interventions: Self-care/ADL training;Therapeutic exercise;Energy conservation;DME and/or AE instruction;Therapeutic activities;Patient/family education    OT Goals(Current goals can be found in the care plan section) Acute Rehab OT Goals Patient Stated Goal: back able to as much for himself as possible OT Goal Formulation: With patient/family Time For Goal Achievement: 10/06/17 Potential to Achieve Goals: Good ADL Goals Pt Will Perform Grooming: with modified independence;standing Pt Will Perform Upper Body Dressing:  with modified independence;sitting Pt Will Perform Lower Body Dressing: with modified independence;sit to/from stand Pt Will Transfer to Toilet: with modified independence;ambulating;bedside commode Pt Will Perform Toileting - Clothing Manipulation and hygiene: with modified independence;sit to/from stand  OT Frequency: Min 2X/week   Barriers to D/C:            Co-evaluation              AM-PAC PT "6 Clicks" Daily Activity     Outcome Measure Help from another person eating meals?: None Help from another person taking care of personal grooming?: A Little Help from another person toileting, which includes using toliet, bedpan, or urinal?: A Little Help from another person bathing (including washing, rinsing, drying)?: A Little Help from another person to put on and taking off regular upper body clothing?: A Little Help from another person to put on and taking off regular lower body clothing?: A Little 6 Click Score: 19   End of Session Equipment Utilized During Treatment: Gait belt Nurse Communication: Mobility status;Other (comment) (Condom cath coming off)  Activity Tolerance: Patient tolerated treatment well Patient left: in chair;with call bell/phone within reach;with family/visitor present  OT Visit Diagnosis: Unsteadiness on feet (R26.81);Other abnormalities of gait and mobility (R26.89);Muscle weakness (generalized) (M62.81);Other symptoms and signs involving cognitive function                Time: 6546-5035 OT Time Calculation (min): 28 min Charges:  OT General Charges $OT Visit: 1  Visit OT Evaluation $OT Eval Moderate Complexity: 1 Mod OT Treatments $Self Care/Home Management : 8-22 mins G-Codes:     Leasia Swann MSOT, OTR/L Acute Rehab Pager: [phone] Office: [place] 09/22/2017, 12:38 PM

## 2017-09-22 NOTE — Clinical Social Work Note (Signed)
Clinical Social Work Assessment  Patient Details  Name: Edward [name] MRN: [id number] Date of Birth: 1929/12/16  Date of referral:  09/22/17               Reason for consult:  Facility Placement                Permission sought to share information with:  Facility Sport and exercise psychologist, Family Supports Permission granted to share information::  Yes, Verbal Permission Granted  Name::     Teacher, early years/pre::  SNFs  Relationship::  Spouse  Contact Information:  [phone]  Housing/Transportation Living arrangements for the past 2 months:  Single Family Home Source of Information:  Spouse Patient Interpreter Needed:  None Criminal Activity/Legal Involvement Pertinent to Current Situation/Hospitalization:  No - Comment as needed Significant Relationships:  Adult Children, Spouse Lives with:  Spouse Do you feel safe going back to the place where you live?  No Need for family participation in patient care:  Yes (Comment)  Care giving concerns:  CSW received consult for possible SNF placement at time of discharge. Patient's wife states that she [place] unable to care for patient at home and is requesting SNF placement. CSW to continue to follow and assist with discharge planning needs.   Social Worker assessment / plan:  CSW spoke with patient's spouse concerning possibility of rehab at [facility] before returning home.  Employment status:  Retired Nurse, adult PT Recommendations:  [place] / Referral to community resources:  [place]  Patient/Family's Response to care:  Patient's spouse reported preference for [facility].   Patient/Family's Understanding of and Emotional Response to Diagnosis, Current Treatment, and Prognosis:  Patient/family is realistic regarding therapy needs and expressed being hopeful for SNF placement. Patient's spouse expressed understanding of CSW role and discharge process as well as medical  condition. No questions/concerns about plan or treatment.    Emotional Assessment Appearance:  Appears stated age Attitude/Demeanor/Rapport:  Unable to Assess Affect (typically observed):  Unable to Assess Orientation:  Oriented to Self, Oriented to Place Alcohol / Substance use:  Not Applicable Psych involvement (Current and /or in the community):  No (Comment)  Discharge Needs  Concerns to be addressed:  Care Coordination Readmission within the last 30 days:  No Current discharge risk:  None Barriers to Discharge:  No Barriers Identified   Benard Halsted, [place] 09/22/2017, 11:33 AM

## 2017-09-22 NOTE — Care Management Important Message (Signed)
Important Message  Patient Details  Name: Edward [name] MRN: [id number] Date of Birth: [birth year]   Medicare Important Message Given:  Yes    Mason Burleigh [address]ine Circle 09/22/2017, 12:37 PM

## 2017-09-22 NOTE — Progress Notes (Signed)
Edward [name] to be D/C'd Skilled nursing facility [facility] per MD order.  Discussed with the patient and caregiver and all questions fully answered.  VSS, Skin clean, dry and intact without evidence of skin break down, no evidence of skin tears noted. IV catheter discontinued intact. Site without signs and symptoms of complications. Dressing and pressure applied.  An After Visit Summary was printed and given to the patient and electronically sent to the facility. Caregiver  received prescription.  D/c education completed with patient/family including follow up instructions, medication list, d/c activities limitations if indicated, with other d/c instructions as indicated by MD - patient able to verbalize understanding, all questions fully answered.   Patient instructed to return to ED, call 911, or call MD for any changes in condition.   Patient escorted via [place], and D/C via private auto with family to [facility].  Sharion Balloon 09/22/2017 5:25 PM

## 2017-09-22 NOTE — Discharge Summary (Addendum)
Physician Discharge Summary  Edward [name] HUD:[id number] DOB: 22-Oct-1930 DOA: 09/17/2017  PCP: Edward Bowen, MD  Admit date: 09/17/2017 Discharge date: 09/22/2017  Admitted From: home  Disposition:  SNF  Recommendations for Outpatient Follow-up:  1. F/u BP and HR in office- Resume Metoprolol as needed 2. Please repeat CXR in 2 -3 wks 3. Please obtain Bmet and Mg+ at next visit- Mg+ supplements ordered  Home Health:  ordered     Discharge Condition:  stable   CODE STATUS:  Full code   Consultations:  none   Discharge Diagnoses:  Principal Problem:   Sepsis due to pneumonia [facility]) Active Problems:   Acute encephalopathy   Chronic sinus bradycardia   Aortic valve disorder   COPD (chronic obstructive pulmonary disease) (HCC)   Essential hypertension   Memory disorder   CAD (coronary artery disease)   Mild renal insufficiency   Chronic diastolic CHF (congestive heart failure) (HCC)    Subjective: No complaints. Per wife, he decided to chew some of his medications this AM and has since been restless.  Brief Summary: Edward [name] 83 yo M with COPD, CAD,HTN, dCHF, dementia, poor hearing who presents with encephalopathy, hypotension, sepsis on 10/28.  Patient is reported to have a chronic cough that his family attributes to his asthma/COPD.  His wife woke the night of 09/13/2017 to her husband coughing, then noted him to be choking, he then reported that he "cannot breathe."  Wife became very concerned, got dressed to take the patient to the hospital, but things then settled down and he was able to get back to sleep.  He seemed to be in his usual state the next day, but has since developed worsening in his chronic cough with sputum production.  He was holding a normal conversation at lunch on the day of his presentation, but by later that evening, he was complaining of chills, noted to be febrile, and noted to be increasingly confused and hallucinating.  Started on  vanc/Unasyn initially, broadened  to Vanc/Cefepime/Azithromycin. Cultures growing E coli on 10/28 evening and narrowed to Cefepime.   Hospital Course:  Severe Sepsis from E coli bacteremia - SBP in 80s has improved with fluids and treatment of infection - 1/1 cultures growing E. Coli, pan sensitive-- repeat cultures negative  - ? Aspiration at home- has not had issues with aspiration in the hospital or in the past -  U culture has grown 30,000 colonies of diptheroids -  CT abd/pelvis shows "mild bladder wall thickening could reflect cystitis. Likely small mucus plug at the right middle lung lobe. Minimal peripheral opacity at the right lower lobe may reflect mild infection. - switched from cefepime to cefazolin on 10/30  to which the E coli is sensitive    - still noted to have a cough with mild congestion- no resp distress and not hypoxic  Acute Toxic metabolic encephalopathy Encephalopathy from #1 above. Resolved now. -has underlying dementia and wife states he is at baseline  Sinus Bradycardia -with HR in 40-60s -Metoprolol (BP was low) and Donepezil were stopped and HR has improved to 70s - will resume Donepezil for now and have PCP f/u on his heart rate  Hypokalemia and Hypomagnesemia - is being replaced and should be followed as outpt  Mod Aortic regurg, mild stenosis/ Inf-Lat WMA Grade 2 dCHF- chronic - chronic and noted on prior ECHOs  Hypertension and CAD -Ramapril was on hold and has just been resumed as BP has improved -Continue statin, Plavix  COPD -Continue inhalers, Singulair  AOCD - follow as outpt  Mild thrombocytopenia  - during hospital stay- nadir 109- has improved to normal  Gastroparesis?  - takes Reglan regularly as outpt- continue   Troponin elevation - 0.06 > 0.07- Flat trend - ECHO shows WMA which is chronic   NOTE: wife has not been giving Sertraline regularly and it is now finished- OK to remain off of it for now. His PCP is aware  per his wife.     Discharge Instructions  Discharge Instructions    Call MD for:  difficulty breathing, headache or visual disturbances    Complete by:  As directed    Call MD for:  persistant dizziness or light-headedness    Complete by:  As directed    Call MD for:  persistant nausea and vomiting    Complete by:  As directed    Call MD for:  temperature >100.4    Complete by:  As directed    Diet - low sodium heart healthy    Complete by:  As directed    Increase activity slowly    Complete by:  As directed      Allergies as of 09/22/2017      Reactions   Aspirin Rash      Medication List    STOP taking these medications   metoprolol tartrate 50 MG tablet Commonly known as:  LOPRESSOR   sertraline 25 MG tablet Commonly known as:  ZOLOFT     TAKE these medications   acetaminophen 325 MG tablet Commonly known as:  TYLENOL Take 2 tablets (650 mg total) by mouth every 6 (six) hours as needed for mild pain (or Fever >/= 101).   ADVAIR DISKUS 250-50 MCG/DOSE Aepb Generic drug:  Fluticasone-Salmeterol Inhale 1 puff into the lungs 2 (two) times daily as needed (SOB).   atorvastatin 40 MG tablet Commonly known as:  LIPITOR Take 40 mg by mouth daily.   bisacodyl 5 MG EC tablet Commonly known as:  DULCOLAX Take 1 tablet (5 mg total) by mouth daily as needed for moderate constipation.   cephALEXin 500 MG capsule Commonly known as:  KEFLEX Take 1 capsule (500 mg total) by mouth every 12 (twelve) hours.   clopidogrel 75 MG tablet Commonly known as:  PLAVIX TAKE 1 TABLET BY MOUTH DAILY.   donepezil 10 MG tablet Commonly known as:  ARICEPT Take 1 tablet (10 mg total) by mouth at bedtime.   magnesium oxide 400 (241.3 Mg) MG tablet Commonly known as:  MAG-OX Take 1 tablet (400 mg total) by mouth daily.   metoCLOPramide 5 MG tablet Commonly known as:  REGLAN Take 1 tablet (5 mg total) by mouth 4 (four) times daily -  before meals and at bedtime. What  changed:  when to take this  reasons to take this   mometasone 50 MCG/ACT nasal spray Commonly known as:  NASONEX Place 2 sprays into the nose daily as needed (allergies).   pantoprazole 40 MG tablet Commonly known as:  PROTONIX Take 40 mg by mouth daily.   ramipril 2.5 MG capsule Commonly known as:  ALTACE Take 2.5 mg by mouth daily.   senna-docusate 8.6-50 MG tablet Commonly known as:  Senokot-S Take 1 tablet by mouth at bedtime as needed for mild constipation.   triamcinolone cream 0.1 % Commonly known as:  KENALOG Apply 1 application topically 2 (two) times daily as needed (rash).   zafirlukast 20 MG tablet Commonly known as:  ACCOLATE Take  20 mg by mouth 2 (two) times daily before a meal.       Allergies  Allergen Reactions  . Aspirin Rash     Procedures/Studies:    Dg Chest 2 View  Result Date: 09/18/2017 CLINICAL DATA:  Sepsis EXAM: CHEST  2 VIEW COMPARISON:  09/04/2016 FINDINGS: Mild diffuse chronic bronchitic changes. No acute consolidation or effusion. Stable cardiomediastinal silhouette with borderline cardiomegaly. Aortic atherosclerosis. No pneumothorax. IMPRESSION: No focal pulmonary infiltrate. Electronically Signed   By: Donavan Foil M.D.   On: 09/18/2017 00:52   Ct Abdomen Pelvis W Contrast  Result Date: 09/18/2017 CLINICAL DATA:  Acute onset of nausea and vomiting. Shaking. Initial encounter. EXAM: CT ABDOMEN AND PELVIS WITH CONTRAST TECHNIQUE: Multidetector CT imaging of the abdomen and pelvis was performed using the standard protocol following bolus administration of intravenous contrast. CONTRAST:  150mL ISOVUE-300 IOPAMIDOL (ISOVUE-300) INJECTION 61% COMPARISON:  Pelvic radiograph performed 11/30/2015, and CT of the abdomen and pelvis from 03/17/2015 FINDINGS: Lower chest: Diffuse coronary artery calcifications are seen. Minimal scarring is noted at the lung bases. A likely small mucus plug is noted at the right middle lobe. Minimal peripheral  opacity at the right lower lobe may reflect mild infection. A small hiatal hernia is noted. Hepatobiliary: The liver is unremarkable in appearance. The gallbladder is unremarkable in appearance. The common bile duct remains normal in caliber. Pancreas: The pancreas is within normal limits. Spleen: The spleen is unremarkable in appearance. Adrenals/Urinary Tract: The adrenal glands are unremarkable in appearance. A prominent right renal cyst is noted. Mild left-sided renal pelvicaliectasis remains within normal limits. Nonspecific perinephric stranding is noted bilaterally. There is no evidence of hydronephrosis. No renal or ureteral stones are identified. Stomach/Bowel: The stomach is unremarkable in appearance. The small bowel is within normal limits. The appendix is normal in caliber, without evidence of appendicitis. Diffuse diverticulosis is noted along the descending and proximal sigmoid colon, without evidence of diverticulitis. Vascular/Lymphatic: Diffuse calcification is seen along the abdominal aorta and its branches. The abdominal aorta is otherwise grossly unremarkable. The inferior vena cava is grossly unremarkable. No retroperitoneal lymphadenopathy is seen. No pelvic sidewall lymphadenopathy is identified. Reproductive: Mild bladder wall thickening could reflect cystitis. Would correlate with the patient's symptoms. The prostate is mildly enlarged, measuring 5.0 cm in transverse dimension. Other: No additional soft tissue abnormalities are seen. Musculoskeletal: No acute osseous abnormalities are identified. The left femoral intramedullary rod is incompletely imaged but appears grossly unremarkable. Multilevel vacuum phenomenon is noted along the lumbar spine. The visualized musculature is unremarkable in appearance. IMPRESSION: 1. Mild bladder wall thickening could reflect cystitis. Would correlate with the patient's symptoms. 2. Likely small mucus plug at the right middle lung lobe. Minimal  peripheral opacity at the right lower lobe may reflect mild infection. 3. Mildly enlarged prostate. 4. Diffuse aortic atherosclerosis. 5. Diffuse diverticulosis along the descending and proximal sigmoid colon, without evidence of diverticulitis. 6. Small hiatal hernia noted. 7. Prominent right renal cyst. 8. Degenerative change along the lumbar spine. Electronically Signed   By: Garald Balding M.D.   On: 09/18/2017 02:24       Discharge Exam: Vitals:   09/22/17 0814 09/22/17 0937  BP:  (!) 133/58  Pulse:    Resp:    Temp:    SpO2: 96%    Vitals:   09/22/17 0500 09/22/17 0601 09/22/17 0814 09/22/17 0937  BP:  (!) 184/61  (!) 133/58  Pulse:  73    Resp:  18    Temp:  97.7 F (36.5 C)    TempSrc:  Oral    SpO2:  93% 96%   Weight: 52.2 kg (115 lb 1.3 oz)     Height:        General: Pt is alert, awake, not in acute distress Cardiovascular: RRR, S1/S2 +, no rubs, no gallops Respiratory: CTA bilaterally, no wheezing, no rhonchi Abdominal: Soft, NT, ND, bowel sounds + Extremities: no edema, no cyanosis    The results of significant diagnostics from this hospitalization (including imaging, microbiology, ancillary and laboratory) are listed below for reference.     Microbiology: Recent Results (from the past 240 hour(s))  Blood Culture (routine x 2)     Status: Abnormal   Collection Time: 09/17/17 10:07 PM  Result Value Ref Range Status   Specimen Description BLOOD LEFT ARM  Final   Special Requests   Final    BOTTLES DRAWN AEROBIC AND ANAEROBIC Blood Culture results may not be optimal due to an excessive volume of blood received in culture bottles   Culture  Setup Time   Final    GRAM NEGATIVE RODS IN BOTH AEROBIC AND ANAEROBIC BOTTLES CRITICAL RESULT CALLED TO, READ BACK BY AND VERIFIED WITH: B MANCHERIL,PHARMD AT 1319 09/18/17 BY L BENFIELD    Culture ESCHERICHIA COLI (A)  Final   Report Status 09/20/2017 FINAL  Final   Organism ID, Bacteria ESCHERICHIA COLI  Final       Susceptibility   Escherichia coli - MIC*    AMPICILLIN <=2 SENSITIVE Sensitive     CEFAZOLIN <=4 SENSITIVE Sensitive     CEFEPIME <=1 SENSITIVE Sensitive     CEFTAZIDIME <=1 SENSITIVE Sensitive     CEFTRIAXONE <=1 SENSITIVE Sensitive     CIPROFLOXACIN <=0.25 SENSITIVE Sensitive     GENTAMICIN <=1 SENSITIVE Sensitive     IMIPENEM <=0.25 SENSITIVE Sensitive     TRIMETH/SULFA <=20 SENSITIVE Sensitive     AMPICILLIN/SULBACTAM <=2 SENSITIVE Sensitive     PIP/TAZO <=4 SENSITIVE Sensitive     Extended ESBL NEGATIVE Sensitive     * ESCHERICHIA COLI  Blood Culture ID Panel (Reflexed)     Status: Abnormal   Collection Time: 09/17/17 10:07 PM  Result Value Ref Range Status   Enterococcus species NOT DETECTED NOT DETECTED Final   Listeria monocytogenes NOT DETECTED NOT DETECTED Final   Staphylococcus species NOT DETECTED NOT DETECTED Final   Staphylococcus aureus NOT DETECTED NOT DETECTED Final   Streptococcus species NOT DETECTED NOT DETECTED Final   Streptococcus agalactiae NOT DETECTED NOT DETECTED Final   Streptococcus pneumoniae NOT DETECTED NOT DETECTED Final   Streptococcus pyogenes NOT DETECTED NOT DETECTED Final   Acinetobacter baumannii NOT DETECTED NOT DETECTED Final   Enterobacteriaceae species DETECTED (A) NOT DETECTED Final    Comment: Enterobacteriaceae represent a large family of gram-negative bacteria, not a single organism. CRITICAL RESULT CALLED TO, READ BACK BY AND VERIFIED WITH: B MANCHERIL,PHARMD AT 1319 09/18/17 BY L BENFIELD    Enterobacter cloacae complex NOT DETECTED NOT DETECTED Final   Escherichia coli DETECTED (A) NOT DETECTED Final    Comment: CRITICAL RESULT CALLED TO, READ BACK BY AND VERIFIED WITH: B MANCHERIL,PHARMD AT 1319 09/18/17 BY L BENFIELD    Klebsiella oxytoca NOT DETECTED NOT DETECTED Final   Klebsiella pneumoniae NOT DETECTED NOT DETECTED Final   Proteus species NOT DETECTED NOT DETECTED Final   Serratia marcescens NOT DETECTED NOT DETECTED  Final   Carbapenem resistance NOT DETECTED NOT DETECTED Final   Haemophilus influenzae NOT DETECTED NOT  DETECTED Final   Neisseria meningitidis NOT DETECTED NOT DETECTED Final   Pseudomonas aeruginosa NOT DETECTED NOT DETECTED Final   Candida albicans NOT DETECTED NOT DETECTED Final   Candida glabrata NOT DETECTED NOT DETECTED Final   Candida krusei NOT DETECTED NOT DETECTED Final   Candida parapsilosis NOT DETECTED NOT DETECTED Final   Candida tropicalis NOT DETECTED NOT DETECTED Final  Urine culture     Status: Abnormal   Collection Time: 09/18/17  1:55 AM  Result Value Ref Range Status   Specimen Description URINE, CLEAN CATCH  Final   Special Requests NONE  Final   Culture (A)  Final    30,000 COLONIES/mL DIPHTHEROIDS(CORYNEBACTERIUM SPECIES) Standardized susceptibility testing for this organism is not available.    Report Status 09/19/2017 FINAL  Final  MRSA PCR Screening     Status: None   Collection Time: 09/18/17  2:13 PM  Result Value Ref Range Status   MRSA by PCR NEGATIVE NEGATIVE Final    Comment:        The GeneXpert MRSA Assay (FDA approved for NASAL specimens only), is one component of a comprehensive MRSA colonization surveillance program. It is not intended to diagnose MRSA infection nor to guide or monitor treatment for MRSA infections.   Culture, blood (routine x 2)     Status: None (Preliminary result)   Collection Time: 09/18/17  2:30 PM  Result Value Ref Range Status   Specimen Description BLOOD BLOOD LEFT HAND  Final   Special Requests   Final    BOTTLES DRAWN AEROBIC AND ANAEROBIC Blood Culture adequate volume   Culture NO GROWTH 3 DAYS  Final   Report Status PENDING  Incomplete  Culture, blood (routine x 2)     Status: None (Preliminary result)   Collection Time: 09/18/17  2:37 PM  Result Value Ref Range Status   Specimen Description BLOOD RIGHT ANTECUBITAL  Final   Special Requests   Final    BOTTLES DRAWN AEROBIC ONLY Blood Culture adequate  volume   Culture NO GROWTH 3 DAYS  Final   Report Status PENDING  Incomplete     Labs: BNP (last 3 results) No results for input(s): BNP in the last 8760 hours. Basic Metabolic Panel:  Recent Labs Lab 09/18/17 0548 09/19/17 0236 09/20/17 0222 09/21/17 0715 09/22/17 0535  NA 138 140 141 140 141  K 3.6 4.0 3.8 3.6 3.2*  CL 111 113* 120* 113* 112*  CO2 21* 21* 14* 18* 19*  GLUCOSE 146* 154* 124* 96 87  BUN 12 16 20 11 8   CREATININE 1.04 1.18 1.15 1.08 0.98  CALCIUM 6.7* 7.0* 7.3* 7.6* 7.4*  MG  --   --   --   --  1.4*   Liver Function Tests:  Recent Labs Lab 09/17/17 2306 09/18/17 0548 09/19/17 0236 09/20/17 0222  AST 45* 34 32 30  ALT 28 26 26 29   ALKPHOS 113 78 89 72  BILITOT 0.9 0.5 0.3 0.5  PROT 6.6 4.6* 5.0* 4.8*  ALBUMIN 4.1 2.7* 2.9* 2.7*   No results for input(s): LIPASE, AMYLASE in the last 168 hours. No results for input(s): AMMONIA in the last 168 hours. CBC:  Recent Labs Lab 09/17/17 2306 09/18/17 0019 09/18/17 0548 09/19/17 0236 09/20/17 0416 09/21/17 0715  WBC 10.3  --  9.3 8.4 10.1 9.5  NEUTROABS  --   --  8.0*  --   --   --   HGB 11.0* 9.9* 8.1* 8.6* 8.1* 9.0*  HCT 33.2*  29.0* 24.6* 27.4* 25.4* 27.8*  MCV 89.0  --  89.8 91.6 90.4 89.7  PLT 162  --  109* 116* 133* 144*   Cardiac Enzymes:  Recent Labs Lab 09/18/17 1433 09/18/17 2139  TROPONINI 0.07* 0.06*   BNP: Invalid input(s): POCBNP CBG:  Recent Labs Lab 09/21/17 0809 09/21/17 1308 09/21/17 1749 09/21/17 2125 09/22/17 0813  GLUCAP 102* 92 92 99 87   D-Dimer No results for input(s): DDIMER in the last 72 hours. Hgb A1c No results for input(s): HGBA1C in the last 72 hours. Lipid Profile No results for input(s): CHOL, HDL, LDLCALC, TRIG, CHOLHDL, LDLDIRECT in the last 72 hours. Thyroid function studies No results for input(s): TSH, T4TOTAL, T3FREE, THYROIDAB in the last 72 hours.  Invalid input(s): FREET3 Anemia work up  Recent Labs  09/21/17 0715  FERRITIN  56  TIBC 232*  IRON 11*   Urinalysis    Component Value Date/Time   COLORURINE YELLOW 09/17/2017 2250   APPEARANCEUR CLEAR 09/17/2017 2250   LABSPEC 1.028 09/17/2017 2250   PHURINE 7.0 09/17/2017 2250   GLUCOSEU NEGATIVE 09/17/2017 2250   HGBUR NEGATIVE 09/17/2017 2250   BILIRUBINUR NEGATIVE 09/17/2017 2250   KETONESUR NEGATIVE 09/17/2017 2250   PROTEINUR NEGATIVE 09/17/2017 2250   UROBILINOGEN 1.0 01/24/2012 1216   NITRITE NEGATIVE 09/17/2017 2250   LEUKOCYTESUR NEGATIVE 09/17/2017 2250   Sepsis Labs Invalid input(s): PROCALCITONIN,  WBC,  LACTICIDVEN Microbiology Recent Results (from the past 240 hour(s))  Blood Culture (routine x 2)     Status: Abnormal   Collection Time: 09/17/17 10:07 PM  Result Value Ref Range Status   Specimen Description BLOOD LEFT ARM  Final   Special Requests   Final    BOTTLES DRAWN AEROBIC AND ANAEROBIC Blood Culture results may not be optimal due to an excessive volume of blood received in culture bottles   Culture  Setup Time   Final    GRAM NEGATIVE RODS IN BOTH AEROBIC AND ANAEROBIC BOTTLES CRITICAL RESULT CALLED TO, READ BACK BY AND VERIFIED WITH: B MANCHERIL,PHARMD AT 1319 09/18/17 BY L BENFIELD    Culture ESCHERICHIA COLI (A)  Final   Report Status 09/20/2017 FINAL  Final   Organism ID, Bacteria ESCHERICHIA COLI  Final      Susceptibility   Escherichia coli - MIC*    AMPICILLIN <=2 SENSITIVE Sensitive     CEFAZOLIN <=4 SENSITIVE Sensitive     CEFEPIME <=1 SENSITIVE Sensitive     CEFTAZIDIME <=1 SENSITIVE Sensitive     CEFTRIAXONE <=1 SENSITIVE Sensitive     CIPROFLOXACIN <=0.25 SENSITIVE Sensitive     GENTAMICIN <=1 SENSITIVE Sensitive     IMIPENEM <=0.25 SENSITIVE Sensitive     TRIMETH/SULFA <=20 SENSITIVE Sensitive     AMPICILLIN/SULBACTAM <=2 SENSITIVE Sensitive     PIP/TAZO <=4 SENSITIVE Sensitive     Extended ESBL NEGATIVE Sensitive     * ESCHERICHIA COLI  Blood Culture ID Panel (Reflexed)     Status: Abnormal   Collection  Time: 09/17/17 10:07 PM  Result Value Ref Range Status   Enterococcus species NOT DETECTED NOT DETECTED Final   Listeria monocytogenes NOT DETECTED NOT DETECTED Final   Staphylococcus species NOT DETECTED NOT DETECTED Final   Staphylococcus aureus NOT DETECTED NOT DETECTED Final   Streptococcus species NOT DETECTED NOT DETECTED Final   Streptococcus agalactiae NOT DETECTED NOT DETECTED Final   Streptococcus pneumoniae NOT DETECTED NOT DETECTED Final   Streptococcus pyogenes NOT DETECTED NOT DETECTED Final   Acinetobacter baumannii NOT DETECTED  NOT DETECTED Final   Enterobacteriaceae species DETECTED (A) NOT DETECTED Final    Comment: Enterobacteriaceae represent a large family of gram-negative bacteria, not a single organism. CRITICAL RESULT CALLED TO, READ BACK BY AND VERIFIED WITH: B MANCHERIL,PHARMD AT 1319 09/18/17 BY L BENFIELD    Enterobacter cloacae complex NOT DETECTED NOT DETECTED Final   Escherichia coli DETECTED (A) NOT DETECTED Final    Comment: CRITICAL RESULT CALLED TO, READ BACK BY AND VERIFIED WITH: B MANCHERIL,PHARMD AT 1319 09/18/17 BY L BENFIELD    Klebsiella oxytoca NOT DETECTED NOT DETECTED Final   Klebsiella pneumoniae NOT DETECTED NOT DETECTED Final   Proteus species NOT DETECTED NOT DETECTED Final   Serratia marcescens NOT DETECTED NOT DETECTED Final   Carbapenem resistance NOT DETECTED NOT DETECTED Final   Haemophilus influenzae NOT DETECTED NOT DETECTED Final   Neisseria meningitidis NOT DETECTED NOT DETECTED Final   Pseudomonas aeruginosa NOT DETECTED NOT DETECTED Final   Candida albicans NOT DETECTED NOT DETECTED Final   Candida glabrata NOT DETECTED NOT DETECTED Final   Candida krusei NOT DETECTED NOT DETECTED Final   Candida parapsilosis NOT DETECTED NOT DETECTED Final   Candida tropicalis NOT DETECTED NOT DETECTED Final  Urine culture     Status: Abnormal   Collection Time: 09/18/17  1:55 AM  Result Value Ref Range Status   Specimen Description  URINE, CLEAN CATCH  Final   Special Requests NONE  Final   Culture (A)  Final    30,000 COLONIES/mL DIPHTHEROIDS(CORYNEBACTERIUM SPECIES) Standardized susceptibility testing for this organism is not available.    Report Status 09/19/2017 FINAL  Final  MRSA PCR Screening     Status: None   Collection Time: 09/18/17  2:13 PM  Result Value Ref Range Status   MRSA by PCR NEGATIVE NEGATIVE Final    Comment:        The GeneXpert MRSA Assay (FDA approved for NASAL specimens only), is one component of a comprehensive MRSA colonization surveillance program. It is not intended to diagnose MRSA infection nor to guide or monitor treatment for MRSA infections.   Culture, blood (routine x 2)     Status: None (Preliminary result)   Collection Time: 09/18/17  2:30 PM  Result Value Ref Range Status   Specimen Description BLOOD BLOOD LEFT HAND  Final   Special Requests   Final    BOTTLES DRAWN AEROBIC AND ANAEROBIC Blood Culture adequate volume   Culture NO GROWTH 3 DAYS  Final   Report Status PENDING  Incomplete  Culture, blood (routine x 2)     Status: None (Preliminary result)   Collection Time: 09/18/17  2:37 PM  Result Value Ref Range Status   Specimen Description BLOOD RIGHT ANTECUBITAL  Final   Special Requests   Final    BOTTLES DRAWN AEROBIC ONLY Blood Culture adequate volume   Culture NO GROWTH 3 DAYS  Final   Report Status PENDING  Incomplete     Time coordinating discharge: Over 30 minutes  SIGNED:   Debbe Odea, MD  [facility] 09/22/2017, 10:31 AM Pager   If 7PM-7AM, please contact night-coverage [email or web address] Password [email or web address]

## 2017-09-22 NOTE — NC FL2 (Signed)
[place] LEVEL OF CARE SCREENING TOOL     IDENTIFICATION  Patient Name: Edward [name] Birthdate: 08-Jan-1930 Sex: male Admission Date (Current Location): 09/17/2017  [facility] [place] Number:  Herbalist and Address:  [facility] [place], [place] [address] [place], [place] [address]      Provider Number: [id number]  Attending Physician Name and Address:  Debbe Odea, MD  Relative Name and Phone Number:  Gay Filler, spouse, [phone]    Current Level of Care: Hospital Recommended Level of Care: [place] Prior Approval Number:    Date Approved/Denied:   PASRR Number: [id number]  Discharge Plan: SNF    Current Diagnoses: Patient Active Problem List   Diagnosis Date Noted  . Chronic diastolic CHF (congestive heart failure) ([place]) 09/22/2017  . CAD (coronary artery disease) 09/18/2017  . Sepsis due to pneumonia ([place]) 09/18/2017  . Acute encephalopathy 09/18/2017  . Mild renal insufficiency 09/18/2017  . Memory disorder 03/21/2017  . General weakness 09/04/2016  . Near syncope 09/04/2016  . Essential hypertension   . Chronic sinus bradycardia 11/30/2015  . HLD (hyperlipidemia) 02/15/2008  . Aortic valve disorder 02/15/2008  . COPD (chronic obstructive pulmonary disease) ([address]) 02/15/2008    Orientation RESPIRATION BLADDER Height & Weight     Self, Place  Normal Continent, External catheter Weight: 52.2 kg (115 lb 1.3 oz) Height:  5\' 1"  (154.9 cm)  BEHAVIORAL SYMPTOMS/MOOD NEUROLOGICAL BOWEL NUTRITION STATUS      Continent Diet (Please see DC Summary)  AMBULATORY STATUS COMMUNICATION OF NEEDS Skin   Limited Assist Verbally Normal                       Personal Care Assistance Level of Assistance  Bathing, Feeding, Dressing Bathing Assistance: Limited assistance Feeding assistance: Independent Dressing Assistance: Limited assistance     Functional Limitations Info  Hearing   Hearing Info:  Impaired      SPECIAL CARE FACTORS FREQUENCY  PT (By licensed PT)     PT Frequency: 5x/week              Contractures      Additional Factors Info  Code Status, Allergies, Psychotropic, Insulin Sliding Scale Code Status Info: Full Allergies Info: Aspirin Psychotropic Info: Zoloft Insulin Sliding Scale Info: 3x daily with meals and at bedtime       Current Medications (09/22/2017):  This is the current hospital active medication list Current Facility-Administered Medications  Medication Dose Route Frequency Provider Last Rate Last Dose  . acetaminophen (TYLENOL) tablet 650 mg  650 mg Oral Q6H PRN Opyd, Ilene Qua, MD   650 mg at 09/21/17 1336   Or  . acetaminophen (TYLENOL) suppository 650 mg  650 mg Rectal Q6H PRN Opyd, Ilene Qua, MD      . atorvastatin (LIPITOR) tablet 40 mg  40 mg Oral q1800 Opyd, Ilene Qua, MD   40 mg at 09/21/17 1845  . bisacodyl (DULCOLAX) EC tablet 5 mg  5 mg Oral Daily PRN Opyd, Ilene Qua, MD      . cephALEXin (KEFLEX) capsule 500 mg  500 mg Oral Q12H Rizwan, Saima, MD      . clopidogrel (PLAVIX) tablet 75 mg  75 mg Oral Daily Opyd, Ilene Qua, MD   75 mg at 09/22/17 0826  . enoxaparin (LOVENOX) injection 40 mg  40 mg Subcutaneous Q24H Edwin Dada, MD   40 mg at 09/21/17 1845  . fluticasone (FLONASE) 50 MCG/ACT  nasal spray 2 spray  2 spray Each Nare Daily Opyd, Ilene Qua, MD   2 spray at 09/22/17 0827  . insulin aspart (novoLOG) injection 0-5 Units  0-5 Units Subcutaneous QHS Opyd, Timothy S, MD      . insulin aspart (novoLOG) injection 0-9 Units  0-9 Units Subcutaneous TID WC Opyd, Ilene Qua, MD   Stopped at 09/22/17 [phone]  . magnesium oxide (MAG-OX) tablet 400 mg  400 mg Oral BID Rizwan, Eunice Blase, MD      . magnesium sulfate IVPB 2 g 50 mL  2 g Intravenous Once Rizwan, Eunice Blase, MD      . metoCLOPramide (REGLAN) tablet 5 mg  5 mg Oral TID AC & HS Opyd, Ilene Qua, MD   5 mg at 09/22/17 0825  . mometasone-formoterol (DULERA) 200-5 MCG/ACT inhaler 2  puff  2 puff Inhalation BID Opyd, Ilene Qua, MD   2 puff at 09/22/17 0814  . montelukast (SINGULAIR) tablet 10 mg  10 mg Oral QHS Opyd, Ilene Qua, MD   10 mg at 09/21/17 2144  . ondansetron (ZOFRAN) tablet 4 mg  4 mg Oral Q6H PRN Opyd, Ilene Qua, MD       Or  . ondansetron (ZOFRAN) injection 4 mg  4 mg Intravenous Q6H PRN Opyd, Ilene Qua, MD      . potassium chloride SA (K-DUR,KLOR-CON) CR tablet 40 mEq  40 mEq Oral Q4H Debbe Odea, MD   40 mEq at 09/22/17 0826  . senna-docusate (Senokot-S) tablet 1 tablet  1 tablet Oral QHS PRN Opyd, Ilene Qua, MD      . sertraline (ZOLOFT) tablet 25 mg  25 mg Oral Daily Opyd, Ilene Qua, MD   25 mg at 09/22/17 0825  . sodium chloride flush (NS) 0.9 % injection 3 mL  3 mL Intravenous Q12H Opyd, Ilene Qua, MD   3 mL at 09/22/17 0737     Discharge Medications: Please see discharge summary for a list of discharge medications.  Relevant Imaging Results:  Relevant Lab Results:   Additional Information SSN: [place] [address] [place], [place]

## 2017-09-22 NOTE — Progress Notes (Signed)
Patient will DC to: [facility] Anticipated DC date: 09/22/17 Family notified: Spouse, son Transport by: car   Per MD patient ready for DC to [facility] RN, patient, patient's family, and facility notified of DC. Discharge Summary sent to facility. RN given number for report [phone]).   CSW signing off.  Cedric Fishman, [place] Social Worker [phone]

## 2017-09-23 LAB — CULTURE, BLOOD (ROUTINE X 2)
Culture: NO GROWTH
Culture: NO GROWTH
SPECIAL REQUESTS: ADEQUATE
Special Requests: ADEQUATE

## 2017-10-25 ENCOUNTER — Encounter: Payer: Self-pay | Admitting: Adult Health

## 2017-10-25 ENCOUNTER — Ambulatory Visit: Payer: Medicare Other | Admitting: Adult Health

## 2017-10-25 VITALS — BP 132/55 | HR 58 | Wt 104.8 lb

## 2017-10-25 DIAGNOSIS — R413 Other amnesia: Secondary | ICD-10-CM

## 2017-10-25 MED ORDER — MEMANTINE HCL 28 X 5 MG & 21 X 10 MG PO TABS: ORAL_TABLET | ORAL | 0 refills | Status: DC

## 2017-10-25 NOTE — Progress Notes (Signed)
I have read the note, and I agree with the clinical assessment and plan.  Charles K Willis   
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
## 2017-10-25 NOTE — Patient Instructions (Addendum)
Your Plan:  Memory score slightly declined Namenda titration pack ordered. Call when you start the fourth week so we can send in new prescription.  If your symptoms worsen or you develop new symptoms please let us know.   Thank you for coming to see [place] at [facility]. I hope we have been able to provide you high quality care today.  You may receive a patient satisfaction survey over the next few weeks. We would appreciate your feedback and comments so that we may continue to improve ourselves and the health of our patients.  Memantine Tablets What is this medicine? MEMANTINE (MEM an teen) is used to treat dementia caused by Alzheimer's disease. This medicine may be used for other purposes; ask your health care provider or pharmacist if you have questions. COMMON BRAND NAME(S): Namenda What should I tell my health care provider before I take this medicine? They need to know if you have any of these conditions: -difficulty passing urine -kidney disease -liver disease -seizures -an unusual or allergic reaction to memantine, other medicines, foods, dyes, or preservatives -pregnant or trying to get pregnant -breast-feeding How should I use this medicine? Take this medicine by mouth with a glass of water. Follow the directions on the prescription label. You may take this medicine with or without food. Take your doses at regular intervals. Do not take your medicine more often than directed. Continue to take your medicine even if you feel better. Do not stop taking except on the advice of your doctor or health care professional. Talk to your pediatrician regarding the use of this medicine in children. Special care may be needed. Overdosage: If you think you have taken too much of this medicine contact a poison control center or emergency room at once. NOTE: This medicine is only for you. Do not share this medicine with others. What if I miss a dose? If you miss a dose, take  it as soon as you can. If it is almost time for your next dose, take only that dose. Do not take double or extra doses. If you do not take your medicine for several days, contact your health care provider. Your dose may need to be changed. What may interact with this medicine? -acetazolamide -amantadine -cimetidine -dextromethorphan -dofetilide -hydrochlorothiazide -ketamine -metformin -methazolamide -quinidine -ranitidine -sodium bicarbonate -triamterene This list may not describe all possible interactions. Give your health care provider a list of all the medicines, herbs, non-prescription drugs, or dietary supplements you use. Also tell them if you smoke, drink alcohol, or use illegal drugs. Some items may interact with your medicine. What should I watch for while using this medicine? Visit your doctor or health care professional for regular checks on your progress. Check with your doctor or health care professional if there is no improvement in your symptoms or if they get worse. You may get drowsy or dizzy. Do not drive, use machinery, or do anything that needs mental alertness until you know how this drug affects you. Do not stand or sit up quickly, especially if you are an older patient. This reduces the risk of dizzy or fainting spells. Alcohol can make you more drowsy and dizzy. Avoid alcoholic drinks. What side effects may I notice from receiving this medicine? Side effects that you should report to your doctor or health care professional as soon as possible: -allergic reactions like skin rash, itching or hives, swelling of the face, lips, or tongue -agitation or a feeling of restlessness -depressed mood -dizziness -  hallucinations -redness, blistering, peeling or loosening of the skin, including inside the mouth -seizures -vomiting Side effects that usually do not require medical attention (report to your doctor or health care professional if they continue or are  bothersome): -constipation -diarrhea -headache -nausea -trouble sleeping This list may not describe all possible side effects. Call your doctor for medical advice about side effects. You may report side effects to FDA at 1-800-FDA-1088. Where should I keep my medicine? Keep out of the reach of children. Store at room temperature between 15 degrees and 30 degrees C (59 degrees and 86 degrees F). Throw away any unused medicine after the expiration date. NOTE: This sheet is a summary. It may not cover all possible information. If you have questions about this medicine, talk to your doctor, pharmacist, or health care provider.  2018 Elsevier/Gold Standard (2013-08-27 14:10:42)

## 2017-11-03 ENCOUNTER — Encounter (INDEPENDENT_AMBULATORY_CARE_PROVIDER_SITE_OTHER): Payer: Self-pay

## 2017-11-03 ENCOUNTER — Ambulatory Visit (INDEPENDENT_AMBULATORY_CARE_PROVIDER_SITE_OTHER): Payer: Medicare Other | Admitting: Cardiovascular Disease

## 2017-11-03 ENCOUNTER — Other Ambulatory Visit: Payer: Self-pay

## 2017-11-03 ENCOUNTER — Encounter: Payer: Self-pay | Admitting: Cardiovascular Disease

## 2017-11-03 VITALS — BP 118/52 | HR 65 | Ht 61.0 in | Wt 107.6 lb

## 2017-11-03 DIAGNOSIS — I350 Nonrheumatic aortic (valve) stenosis: Secondary | ICD-10-CM | POA: Diagnosis not present

## 2017-11-03 DIAGNOSIS — I10 Essential (primary) hypertension: Secondary | ICD-10-CM

## 2017-11-03 DIAGNOSIS — I2510 Atherosclerotic heart disease of native coronary artery without angina pectoris: Secondary | ICD-10-CM

## 2017-11-03 DIAGNOSIS — I340 Nonrheumatic mitral (valve) insufficiency: Secondary | ICD-10-CM

## 2017-11-03 MED ORDER — ATORVASTATIN CALCIUM 40 MG PO TABS: 40.0000 mg | ORAL_TABLET | Freq: Every day | ORAL | 3 refills | Status: DC

## 2017-11-03 NOTE — Patient Instructions (Signed)
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
## 2017-12-05 ENCOUNTER — Telehealth: Payer: Self-pay | Admitting: Adult Health

## 2017-12-05 NOTE — Telephone Encounter (Signed)
Patient"s wife is calling. He has almost finished memantine (NAMENDA TITRATION PAK) tablet pack and would like a Rx called to [facility] on [address] in [place].. She also wants to know if this medication causes drowsiness during the day. Please call and advise.

## 2017-12-06 MED ORDER — MEMANTINE HCL 10 MG PO TABS: 10.0000 mg | ORAL_TABLET | Freq: Two times a day (BID) | ORAL | 11 refills | Status: DC

## 2017-12-06 NOTE — Addendum Note (Signed)
Addended by: Trudie Buckler on: 12/06/2017 10:24 AM   Modules accepted: Orders

## 2017-12-06 NOTE — Telephone Encounter (Signed)
LVM for wife advising her the Rx has been sent. Advised that the NP doesn't feel drowsiness is typically reported as a side effect. However if she notices it as an issue she is to call back. The dose may be adjusted. Left number for any questions.

## 2017-12-06 NOTE — Telephone Encounter (Signed)
namenda could potentially cause some drowsiness however this is not a frequent side effect that has been reported to me.  If this becomes an issue we can potentially adjust the dose.

## 2018-01-12 ENCOUNTER — Other Ambulatory Visit: Payer: Self-pay

## 2018-01-12 ENCOUNTER — Emergency Department (HOSPITAL_COMMUNITY): Payer: Medicare Other

## 2018-01-12 ENCOUNTER — Observation Stay (HOSPITAL_COMMUNITY)
Admission: EM | Admit: 2018-01-12 | Discharge: 2018-01-13 | Disposition: A | Discharge status: Home / Self Care | Payer: Medicare Other | Attending: Internal Medicine | Admitting: Internal Medicine

## 2018-01-12 DIAGNOSIS — I5032 Chronic diastolic (congestive) heart failure: Secondary | ICD-10-CM | POA: Diagnosis present

## 2018-01-12 DIAGNOSIS — R413 Other amnesia: Secondary | ICD-10-CM | POA: Diagnosis present

## 2018-01-12 DIAGNOSIS — R61 Generalized hyperhidrosis: Secondary | ICD-10-CM

## 2018-01-12 DIAGNOSIS — J449 Chronic obstructive pulmonary disease, unspecified: Secondary | ICD-10-CM | POA: Diagnosis not present

## 2018-01-12 DIAGNOSIS — Z79899 Other long term (current) drug therapy: Secondary | ICD-10-CM | POA: Diagnosis not present

## 2018-01-12 DIAGNOSIS — G459 Transient cerebral ischemic attack, unspecified: Principal | ICD-10-CM | POA: Insufficient documentation

## 2018-01-12 DIAGNOSIS — I10 Essential (primary) hypertension: Secondary | ICD-10-CM | POA: Diagnosis present

## 2018-01-12 DIAGNOSIS — R55 Syncope and collapse: Secondary | ICD-10-CM | POA: Diagnosis present

## 2018-01-12 DIAGNOSIS — E785 Hyperlipidemia, unspecified: Secondary | ICD-10-CM | POA: Diagnosis present

## 2018-01-12 DIAGNOSIS — Z955 Presence of coronary angioplasty implant and graft: Secondary | ICD-10-CM | POA: Diagnosis not present

## 2018-01-12 DIAGNOSIS — I110 Hypertensive heart disease with heart failure: Secondary | ICD-10-CM | POA: Diagnosis not present

## 2018-01-12 DIAGNOSIS — Z7902 Long term (current) use of antithrombotics/antiplatelets: Secondary | ICD-10-CM | POA: Insufficient documentation

## 2018-01-12 DIAGNOSIS — I359 Nonrheumatic aortic valve disorder, unspecified: Secondary | ICD-10-CM | POA: Diagnosis present

## 2018-01-12 DIAGNOSIS — I2510 Atherosclerotic heart disease of native coronary artery without angina pectoris: Secondary | ICD-10-CM | POA: Diagnosis not present

## 2018-01-12 LAB — BASIC METABOLIC PANEL
Anion gap: 9 (ref 5–15)
BUN: 16 mg/dL (ref 6–20)
CO2: 24 mmol/L (ref 22–32)
Calcium: 8.4 mg/dL — ABNORMAL LOW (ref 8.9–10.3)
Chloride: 109 mmol/L (ref 101–111)
Creatinine, Ser: 0.99 mg/dL (ref 0.61–1.24)
GFR calc Af Amer: >60 mL/min (ref >60)
GFR calc non Af Amer: >60 mL/min (ref >60)
Glucose, Bld: 160 mg/dL — ABNORMAL HIGH (ref 65–99)
Potassium: 3.9 mmol/L (ref 3.5–5.1)
Sodium: 142 mmol/L (ref 135–145)

## 2018-01-12 LAB — URINALYSIS, ROUTINE W REFLEX MICROSCOPIC
Bilirubin Urine: NEGATIVE
Glucose, UA: NEGATIVE mg/dL
Hgb urine dipstick: NEGATIVE
Ketones, ur: NEGATIVE mg/dL
Leukocytes, UA: NEGATIVE
Nitrite: NEGATIVE
Protein, ur: NEGATIVE mg/dL
Specific Gravity, Urine: 1.02 (ref 1.005–1.030)
pH: 5 (ref 5.0–8.0)

## 2018-01-12 LAB — CBC
HCT: 35.1 % — ABNORMAL LOW (ref 39.0–52.0)
Hemoglobin: 11 g/dL — ABNORMAL LOW (ref 13.0–17.0)
MCH: 28 pg (ref 26.0–34.0)
MCHC: 31.3 g/dL (ref 30.0–36.0)
MCV: 89.3 fL (ref 78.0–100.0)
Platelets: 176 10*3/uL (ref 150–400)
RBC: 3.93 MIL/uL — ABNORMAL LOW (ref 4.22–5.81)
RDW: 14.6 % (ref 11.5–15.5)
WBC: 6.8 10*3/uL (ref 4.0–10.5)

## 2018-01-12 LAB — CBG MONITORING, ED: Glucose-Capillary: 122 mg/dL — ABNORMAL HIGH (ref 65–99)

## 2018-01-12 NOTE — ED Triage Notes (Signed)
Pt to ED via EMS for weakness/near syncopal episode tonight after eating dinner at the [employer]. Pt reports generalized weakness. Pt A/O x2 on arrival. Pt in NAD. Denies pain and weakness but sts "doesn't feel normal."

## 2018-01-12 NOTE — ED Notes (Signed)
Patient transported to CT 
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
## 2018-01-13 ENCOUNTER — Encounter (HOSPITAL_COMMUNITY): Payer: Self-pay

## 2018-01-13 ENCOUNTER — Observation Stay (HOSPITAL_COMMUNITY): Payer: Medicare Other

## 2018-01-13 DIAGNOSIS — I259 Chronic ischemic heart disease, unspecified: Secondary | ICD-10-CM

## 2018-01-13 DIAGNOSIS — I10 Essential (primary) hypertension: Secondary | ICD-10-CM | POA: Diagnosis not present

## 2018-01-13 DIAGNOSIS — J449 Chronic obstructive pulmonary disease, unspecified: Secondary | ICD-10-CM

## 2018-01-13 DIAGNOSIS — I359 Nonrheumatic aortic valve disorder, unspecified: Secondary | ICD-10-CM

## 2018-01-13 DIAGNOSIS — R55 Syncope and collapse: Secondary | ICD-10-CM | POA: Diagnosis not present

## 2018-01-13 DIAGNOSIS — F0390 Unspecified dementia without behavioral disturbance: Secondary | ICD-10-CM

## 2018-01-13 LAB — IRON AND TIBC
Iron: 41 ug/dL — ABNORMAL LOW (ref 45–182)
SATURATION RATIOS: 14 % — AB (ref 17.9–39.5)
TIBC: 300 ug/dL (ref 250–450)
UIBC: 259 ug/dL

## 2018-01-13 LAB — CBC
HCT: 31.8 % — ABNORMAL LOW (ref 39.0–52.0)
Hemoglobin: 10.2 g/dL — ABNORMAL LOW (ref 13.0–17.0)
MCH: 28.9 pg (ref 26.0–34.0)
MCHC: 32.1 g/dL (ref 30.0–36.0)
MCV: 90.1 fL (ref 78.0–100.0)
PLATELETS: 150 10*3/uL (ref 150–400)
RBC: 3.53 MIL/uL — AB (ref 4.22–5.81)
RDW: 15.1 % (ref 11.5–15.5)
WBC: 7.1 10*3/uL (ref 4.0–10.5)

## 2018-01-13 LAB — BASIC METABOLIC PANEL
Anion gap: 12 (ref 5–15)
BUN: 15 mg/dL (ref 6–20)
CALCIUM: 8.3 mg/dL — AB (ref 8.9–10.3)
CO2: 23 mmol/L (ref 22–32)
Chloride: 108 mmol/L (ref 101–111)
Creatinine, Ser: 1.01 mg/dL (ref 0.61–1.24)
Glucose, Bld: 116 mg/dL — ABNORMAL HIGH (ref 65–99)
Potassium: 3.9 mmol/L (ref 3.5–5.1)
SODIUM: 143 mmol/L (ref 135–145)

## 2018-01-13 LAB — VITAMIN B12: VITAMIN B 12: 247 pg/mL (ref 180–914)

## 2018-01-13 LAB — TROPONIN I
Troponin I: <0.03 ng/mL (ref <0.03)
Troponin I: <0.03 ng/mL (ref <0.03)

## 2018-01-13 LAB — FOLATE: Folate: 9.3 ng/mL (ref >5.9)

## 2018-01-13 LAB — FERRITIN: Ferritin: 15 ng/mL — ABNORMAL LOW (ref 24–336)

## 2018-01-13 LAB — RETICULOCYTES
RBC.: 3.53 MIL/uL — ABNORMAL LOW (ref 4.22–5.81)
RETIC CT PCT: 1 % (ref 0.4–3.1)
Retic Count, Absolute: 35.3 10*3/uL (ref 19.0–186.0)

## 2018-01-13 MED ORDER — FLUTICASONE PROPIONATE 50 MCG/ACT NA SUSP
2.0000 | Freq: Every day | NASAL | Status: DC | PRN
  Filled 2018-01-13: qty 16

## 2018-01-13 MED ORDER — ONDANSETRON HCL 4 MG/2ML IJ SOLN: 4.0000 mg | Freq: Four times a day (QID) | INTRAMUSCULAR | Status: DC | PRN

## 2018-01-13 MED ORDER — PANTOPRAZOLE SODIUM 40 MG PO TBEC
40.0000 mg | DELAYED_RELEASE_TABLET | Freq: Every day | ORAL | Status: DC
  Administered 2018-01-13: 40 mg via ORAL
  Filled 2018-01-13: qty 1

## 2018-01-13 MED ORDER — MEMANTINE HCL 10 MG PO TABS
10.0000 mg | ORAL_TABLET | Freq: Two times a day (BID) | ORAL | Status: DC
  Administered 2018-01-13: 10 mg via ORAL
  Filled 2018-01-13 (×2): qty 1

## 2018-01-13 MED ORDER — RAMIPRIL 2.5 MG PO CAPS
2.5000 mg | ORAL_CAPSULE | Freq: Every day | ORAL | Status: DC
Start: 2018-01-13 — End: 2018-01-13
  Administered 2018-01-13: 2.5 mg via ORAL
  Filled 2018-01-13 (×2): qty 1

## 2018-01-13 MED ORDER — ACETAMINOPHEN 650 MG RE SUPP: 650.0000 mg | Freq: Four times a day (QID) | RECTAL | Status: DC | PRN

## 2018-01-13 MED ORDER — CLOPIDOGREL BISULFATE 75 MG PO TABS
75.0000 mg | ORAL_TABLET | Freq: Every day | ORAL | Status: DC
  Administered 2018-01-13: 75 mg via ORAL
  Filled 2018-01-13: qty 1

## 2018-01-13 MED ORDER — MOMETASONE FURO-FORMOTEROL FUM 200-5 MCG/ACT IN AERO
2.0000 | INHALATION_SPRAY | Freq: Two times a day (BID) | RESPIRATORY_TRACT | Status: DC
  Filled 2018-01-13: qty 8.8

## 2018-01-13 MED ORDER — ACETAMINOPHEN 325 MG PO TABS: 650.0000 mg | ORAL_TABLET | Freq: Four times a day (QID) | ORAL | Status: DC | PRN

## 2018-01-13 MED ORDER — ZAFIRLUKAST 20 MG PO TABS
20.0000 mg | ORAL_TABLET | Freq: Two times a day (BID) | ORAL | Status: DC
  Administered 2018-01-13: 20 mg via ORAL
  Filled 2018-01-13 (×2): qty 1

## 2018-01-13 MED ORDER — ATORVASTATIN CALCIUM 40 MG PO TABS
40.0000 mg | ORAL_TABLET | Freq: Every day | ORAL | Status: DC
  Administered 2018-01-13: 40 mg via ORAL
  Filled 2018-01-13: qty 1

## 2018-01-13 MED ORDER — GADOBENATE DIMEGLUMINE 529 MG/ML IV SOLN
10.0000 mL | Freq: Once | INTRAVENOUS | Status: AC | PRN
  Administered 2018-01-13: 10 mL via INTRAVENOUS

## 2018-01-13 MED ORDER — METOCLOPRAMIDE HCL 5 MG PO TABS
5.0000 mg | ORAL_TABLET | Freq: Three times a day (TID) | ORAL | Status: DC
  Administered 2018-01-13: 5 mg via ORAL
  Filled 2018-01-13: qty 1

## 2018-01-13 MED ORDER — ENOXAPARIN SODIUM 40 MG/0.4ML ~~LOC~~ SOLN
40.0000 mg | SUBCUTANEOUS | Status: DC
  Administered 2018-01-13: 40 mg via SUBCUTANEOUS
  Filled 2018-01-13: qty 0.4

## 2018-01-13 MED ORDER — ONDANSETRON HCL 4 MG PO TABS: 4.0000 mg | ORAL_TABLET | Freq: Four times a day (QID) | ORAL | Status: DC | PRN

## 2018-01-13 NOTE — Consult Note (Signed)
Neurology Consultation Reason for Consult: Decreased responsiveness episode Referring Physician: Wynelle Cleveland, S  CC: Decreased responsiveness  History is obtained from: Patient, chart  HPI: Edward [name] is a 84 y.o. male with a history of dementia who presents with transient episode of lightheadedness/dizziness who is less responsive during that episode.  He states that he was sitting at dinner, when he felt "dizzy"like he was about to pass out.  He states that this lasted for less than 10 minutes and he does not feel that he had any episode where he was truly unconscious.  Per ER notes, he was diaphoretic during the episode and kept asking "what happened to me."  He has since returned to his baseline.  He did not have any focal weakness, numbness.  He was sitting when it happened.   ROS: A 14 point ROS was performed and is negative except as noted in the HPI. s.   Past Medical History:  Diagnosis Date  . Adenomatous colon polyp   . Aortic stenosis, mild   . Aortic stenosis, mild   . CAD (coronary artery disease)    Status post prior myocardial infarction and multiple percutaneous interventions as described above nonobstructive disease at last cath and low-risk Myoview scan in 2007  . COPD (chronic obstructive pulmonary disease) ([place])   . COPD (chronic obstructive pulmonary disease) ([place])   . Diverticulosis   . Hyperlipidemia   . Hyperlipidemia   . Hypertension   . Hypertension   . Internal hemorrhoids   . Memory disorder 03/21/2017  . Stroke [facility])    Paraprocedural stoke with little residual following diagnostic catheterization, good LV function  . UTI (urinary tract infection)      Family History  Problem Relation Age of Onset  . Tuberculosis Mother   . Coronary artery disease Neg Hx      Social History:  reports that  has never smoked. he has never used smokeless tobacco. He reports that he drinks alcohol. He reports that he does not use drugs.   Exam: Current vital  signs: BP (!) 150/53 (BP Location: Left Arm)   Pulse 62   Temp 97.8 F (36.6 C) (Oral)   Resp 18   Ht 5\' 5"  (1.651 m)   Wt 49.9 kg (110 lb 0.2 oz)   SpO2 96%   BMI 18.31 kg/m  Vital signs in last 24 hours: Temp:  [97.8 F (36.6 C)-98.1 F (36.7 C)] 97.8 F (36.6 C) (02/22 0451) Pulse Rate:  [59-88] 62 (02/22 0451) Resp:  [12-21] 18 (02/22 0451) BP: (110-172)/(44-73) 150/53 (02/22 0451) SpO2:  [93 %-100 %] 96 % (02/22 0451) Weight:  [48.1 kg (106 lb)-49.9 kg (110 lb 0.2 oz)] 49.9 kg (110 lb 0.2 oz) (02/22 6237)   Physical Exam  Constitutional: Appears well-developed and well-nourished.  Psych: Affect appropriate to situation Eyes: No scleral injection HENT: No OP obstrucion Head: Normocephalic.  Cardiovascular: Normal rate and regular rhythm.  Respiratory: Effort normal, non-labored breathing GI: Soft.  No distension. There is no tenderness.  Skin: WDI  Neuro: Mental Status: Patient is awake, alert, oriented to person, hospital, but not month or year Patient is able to give a clear and coherent history. No signs of aphasia or neglect Cranial Nerves: II: Visual Fields are full. Pupils are equal, round, and reactive to light.   III,IV, VI: EOMI without ptosis or diploplia.  V: Facial sensation is symmetric to temperature VII: Facial movement is symmetric.  VIII: hearing is intact to voice X: Uvula elevates  symmetrically XI: Shoulder shrug is symmetric. XII: tongue is midline without atrophy or fasciculations.  Motor: Tone is normal. Bulk is normal. 5/5 strength was present in all four extremities.  Sensory: Sensation is symmetric to light touch and temperature in the arms and legs. Cerebellar: FNF intact bilaterally  I have reviewed labs in epic and the results pertinent to this consultation are: BMP-unremarkable  I have reviewed the images obtained: MRI brain-old left thalamic infarct  Impression: 84 year old male with history of dementia who presents with  transient episode of lightheadedness and decreased responsiveness.  Possible etiologies include presyncope versus partial seizure.  Given his description of the lightheadedness and lack of definite seizure activity, my suspicion is more for presyncope.  I would not favor starting antiepileptic therapy unless he were to have further events or an abnormal EEG.  Recommendations: 1) EEG 2) if EEG is negative, no further recommendations at this time.   Roland Rack, MD [facility] [phone]  If 7pm- 7am, please page neurology on call as listed in [place].

## 2018-01-13 NOTE — Procedures (Signed)
.  ELECTROENCEPHALOGRAM REPORT  Date of Study: 01/13/2018  Patient's Name: Edward [name] MRN: [id number] Date of Birth: 04/03/1930  Referring Provider: Gean Birchwood, MD  Clinical History: 84 year old man with dementia presents with decreased responsiveness.  Medications: memantine Metoclopramide Atorvastatin Clopidogrel Enoxaparin Pantoprazole Ramipril Zafirlukast  Technical Summary: A multichannel digital EEG recording measured by the international 10-20 system with electrodes applied with paste and impedances below 5000 ohms performed in our laboratory with EKG monitoring in an awake and drowsy patient.  Hyperventilation and photic stimulation were not performed.  The digital EEG was referentially recorded, reformatted, and digitally filtered in a variety of bipolar and referential montages for optimal display.    Description: The patient is awake and drowsy during the recording.  During maximal wakefulness, there is a symmetric, medium voltage 11 Hz posterior dominant rhythm that attenuates with eye opening.  The record is symmetric.  During drowsiness, there is an increase in theta slowing of the background.  Stage 2 sleep is not seen.  There were no epileptiform discharges or electrographic seizures seen.    EKG lead was unremarkable.  Impression: This awake and drowsy EEG is normal.    Clinical Correlation: A normal EEG does not exclude a clinical diagnosis of epilepsy.  If further clinical questions remain, prolonged EEG may be helpful.  Clinical correlation is advised.   Metta Clines, DO

## 2018-01-13 NOTE — Progress Notes (Signed)
Routine EEG completed, results pending. 

## 2018-01-13 NOTE — Discharge Summary (Addendum)
Physician Discharge Summary  Edward [name]:[id number] DOB: [birth year] DOA: 01/12/2018  PCP: [name], MD  Admit date: 01/12/2018 Discharge date: 01/13/2018  Admitted From: home Disposition:  home   Discharge Condition:  stable   CODE STATUS:  Full code   Consultations:  Neurology    Discharge Diagnoses:  Principal Problem:   Near syncope Active Problems:   HLD (hyperlipidemia)   Aortic valve disorder   COPD (chronic obstructive pulmonary disease) ([place])   Essential hypertension   Memory disorder   CAD (coronary artery disease)   Chronic diastolic CHF (congestive heart failure) ([place])    Brief Narrative:  Edward [name] is a 84 y.o. male with history of CAD status post stenting, hypertension, diastolic CHF, moderate mitral regurgitation and mild aortic stenosis, COPD admitted in November 2018 for E. coli bacteremia was brought to the ER by his wife. He and his wife were at a [employer] meeting and had just eaten dinner when he had an episode where he was staring at his wife looking "spaced out". He did not respond to being called by his wife and was noted to be diaphoretic. He admitted to feeling light headed at the time. No focal symptoms were noted and there was no loss of consciousness.  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  Moderate to severe parenchymal brain volume loss with disproportionate temporal lobe atrophy seen with neurodegenerative Syndromes  Subjective: The patient has no complaints today. He is back to his baseline per his wife.   Assessment & Plan:   Principal Problem:   Near syncope - imaging of brain reported above - patient returned to baseline after this short lived episode - Neurology evaluated the patient and recommended an EEG but the suspicion for seizures is low.  - EEG is normal - Neurology suspects this to be a  presyncopal episode which does not need further work up   Active Problems: Dementia - cont [place]     COPD (chronic obstructive pulmonary disease)    Essential hypertension   CAD (coronary artery disease)   Chronic diastolic CHF   - these chronic medical issues are currently stable    Discharge Exam: Vitals:   01/13/18 0600 01/13/18 1017  BP: (!) 160/59 (!) 160/44  Pulse: 67 60  Resp: 18 18  Temp: 98 F (36.7 C) 98.6 F (37 C)  SpO2: 97% 98%   Vitals:   01/13/18 0312 01/13/18 0451 01/13/18 0600 01/13/18 1017  BP: (!) 172/54 (!) 150/53 (!) 160/59 (!) 160/44  Pulse: 62 62 67 60  Resp: 16 18 18 18   Temp: 98.1 F (36.7 C) 97.8 F (36.6 C) 98 F (36.7 C) 98.6 F (37 C)  TempSrc: Oral Oral Oral Oral  SpO2: 100% 96% 97% 98%  Weight: 49.9 kg (110 lb 0.2 oz)     Height: 5\' 5"  (1.651 m)       General: Pt is alert, awake, not in acute distress Cardiovascular: RRR, S1/S2 +, no rubs, no gallops Respiratory: CTA bilaterally, no wheezing, no rhonchi Abdominal: Soft, NT, ND, bowel sounds + Extremities: no edema, no cyanosis   Discharge Instructions  Discharge Instructions    Diet - low sodium heart healthy   Complete by:  As directed    Increase activity slowly   Complete by:  As directed      Allergies as of 01/13/2018  Reactions   Aspirin Rash      Medication List    TAKE these medications   acetaminophen 325 MG tablet Commonly known as:  TYLENOL Take 2 tablets (650 mg total) by mouth every 6 (six) hours as needed for mild pain (or Fever >/= 101).   ADVAIR DISKUS 250-50 MCG/DOSE Aepb Generic drug:  Fluticasone-Salmeterol Inhale 1 puff into the lungs 2 (two) times daily as needed (SOB).   atorvastatin 40 MG tablet Commonly known as:  LIPITOR Take 1 tablet (40 mg total) by mouth daily.   clopidogrel 75 MG tablet Commonly known as:  PLAVIX TAKE 1 TABLET BY MOUTH DAILY.   magnesium oxide 400 (241.3 Mg) MG tablet Commonly known as:  MAG-OX Take 1  tablet (400 mg total) by mouth daily.   memantine 10 MG tablet Commonly known as:  NAMENDA Take 1 tablet (10 mg total) by mouth 2 (two) times daily.   metoCLOPramide 5 MG tablet Commonly known as:  REGLAN Take 1 tablet (5 mg total) by mouth 4 (four) times daily -  before meals and at bedtime.   mometasone 50 MCG/ACT nasal spray Commonly known as:  NASONEX Place 2 sprays into the nose daily as needed (allergies).   pantoprazole 40 MG tablet Commonly known as:  PROTONIX Take 40 mg by mouth daily.   ramipril 2.5 MG capsule Commonly known as:  ALTACE Take 2.5 mg by mouth daily.   zafirlukast 20 MG tablet Commonly known as:  ACCOLATE Take 20 mg by mouth 2 (two) times daily before a meal.       Allergies  Allergen Reactions  . Aspirin Rash     Procedures/Studies: EEG  Ct Head Wo Contrast  Result Date: 01/12/2018 CLINICAL DATA:  Altered level of consciousness EXAM: CT HEAD WITHOUT CONTRAST TECHNIQUE: Contiguous axial images were obtained from the base of the skull through the vertex without intravenous contrast. COMPARISON:  02/21/2016 head CT FINDINGS: Brain: Superficial and central atrophy with chronic small vessel ischemic disease of periventricular white matter. No large vascular territory infarct, hemorrhage or midline shift. No intra-axial mass nor extra-axial fluid collections. Vascular: No hyperdense vessels. Moderate atherosclerosis of the carotid siphons. Skull: No skull fracture or suspicious osseous lesions. Sinuses/Orbits: Frontal and anterior ethmoid sinus mucosal thickening. Stigmata of prior functional endoscopic sinus surgery involving the ethmoid sinuses. Circumferential mural thickening of the maxillary sinuses with thickened maxillary sinus walls compatible with changes of chronic sinusitis. Orbits are intact. Bilateral lens replacements. Other: None IMPRESSION: 1. Chronic stable atrophy and small vessel ischemic disease without acute intracranial abnormality. 2.  Functional endoscopic sinus surgical changes with chronic anterior ethmoid and frontal sinus mucosal thickening. Mild circumferential mucosal thickening of the maxillary sinuses with thickened maxillary sinus walls compatible with stigmata of chronic sinusitis. Electronically Signed   By: Ashley Royalty M.D.   On: 01/12/2018 23:01   Mr Jeri Cos And Wo Contrast  Result Date: 01/13/2018 CLINICAL DATA:  Altered level of consciousness, dizziness and blurry vision. History of memory disorder, hypertension, hyperlipidemia, stroke. EXAM: MRI HEAD WITHOUT AND WITH CONTRAST TECHNIQUE: Multiplanar, multiecho pulse sequences of the brain and surrounding structures were obtained without and with intravenous contrast. CONTRAST:  18mL MULTIHANCE GADOBENATE DIMEGLUMINE 529 MG/ML IV SOLN COMPARISON:  CT HEAD January 12, 2017 MRI of the head February 21, 2016 FINDINGS: INTRACRANIAL CONTENTS: No reduced diffusion to suggest acute ischemia. No susceptibility artifact to suggest hemorrhage. 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 Mild ex vacuo dilatation LEFT aspect third ventricle. Moderate to severe parenchymal  brain volume loss, disproportionate temporal lobe atrophy. Patchy supratentorial white matter FLAIR T2 hyperintensities. No midline shift, mass effect or masses. VASCULAR: Normal major intracranial vascular flow voids present at skull base. SKULL AND UPPER CERVICAL SPINE: No abnormal sellar expansion. No suspicious calvarial bone marrow signal. Craniocervical junction maintained. SINUSES/ORBITS: Severe chronic sinusitis, status post FESS. Mastoid air cells appear well-aerated. The included ocular globes and orbital contents are non-suspicious. Status post bilateral ocular lens implants. OTHER: None. IMPRESSION: 1. No acute intracranial process. 2. 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 3. Moderate to severe parenchymal brain volume loss with disproportionate temporal lobe atrophy seen with  neurodegenerative syndromes. Electronically Signed   By: Elon Alas M.D.   On: 01/13/2018 03:03   Dg Chest Port 1 View  Result Date: 01/13/2018 CLINICAL DATA:  84 year old male with diaphoresis. EXAM: PORTABLE CHEST 1 VIEW COMPARISON:  Chest radiograph dated 09/18/2017 FINDINGS: The lungs are clear. There is no pleural effusion or pneumothorax. The cardiac silhouette is within normal limits coronary vascular calcification and stents noted. There is atherosclerotic calcification of the aortic arch. No acute osseous pathology IMPRESSION: No active disease. Electronically Signed   By: Anner Crete M.D.   On: 01/13/2018 05:51     The results of significant diagnostics from this hospitalization (including imaging, microbiology, ancillary and laboratory) are listed below for reference.     Microbiology: No results found for this or any previous visit (from the past 240 hour(s)).   Labs: BNP (last 3 results) No results for input(s): BNP in the last 8760 hours. Basic Metabolic Panel: Recent Labs  Lab 01/12/18 2117 01/13/18 0630  NA 142 143  K 3.9 3.9  CL 109 108  CO2 24 23  GLUCOSE 160* 116*  BUN 16 15  CREATININE 0.99 1.01  CALCIUM 8.4* 8.3*   Liver Function Tests: No results for input(s): AST, ALT, ALKPHOS, BILITOT, PROT, ALBUMIN in the last 168 hours. No results for input(s): LIPASE, AMYLASE in the last 168 hours. No results for input(s): AMMONIA in the last 168 hours. CBC: Recent Labs  Lab 01/12/18 2117 01/13/18 0630  WBC 6.8 7.1  HGB 11.0* 10.2*  HCT 35.1* 31.8*  MCV 89.3 90.1  PLT 176 150   Cardiac Enzymes: Recent Labs  Lab 01/13/18 0630 01/13/18 1056  TROPONINI <0.03 <0.03   BNP: Invalid input(s): POCBNP CBG: Recent Labs  Lab 01/12/18 2237  GLUCAP 122*   D-Dimer No results for input(s): DDIMER in the last 72 hours. Hgb A1c No results for input(s): HGBA1C in the last 72 hours. Lipid Profile No results for input(s): CHOL, HDL, LDLCALC, TRIG,  CHOLHDL, LDLDIRECT in the last 72 hours. Thyroid function studies No results for input(s): TSH, T4TOTAL, T3FREE, THYROIDAB in the last 72 hours.  Invalid input(s): FREET3 Anemia work up Recent Labs    01/13/18 0630  VITAMINB12 247  FOLATE 9.3  FERRITIN 15*  TIBC 300  IRON 41*  RETICCTPCT 1.0   Urinalysis    Component Value Date/Time   COLORURINE YELLOW 01/12/2018 2330   APPEARANCEUR CLEAR 01/12/2018 2330   LABSPEC 1.020 01/12/2018 2330   PHURINE 5.0 01/12/2018 2330   GLUCOSEU NEGATIVE 01/12/2018 2330   HGBUR NEGATIVE 01/12/2018 2330   BILIRUBINUR NEGATIVE 01/12/2018 2330   KETONESUR NEGATIVE 01/12/2018 2330   PROTEINUR NEGATIVE 01/12/2018 2330   UROBILINOGEN 1.0 01/24/2012 1216   NITRITE NEGATIVE 01/12/2018 2330   LEUKOCYTESUR NEGATIVE 01/12/2018 2330   Sepsis Labs Invalid input(s): PROCALCITONIN,  WBC,  [place] Microbiology No results found for this or any previous visit (from the past 240 hour(s)).   Time coordinating discharge: Over 30 minutes  SIGNED:   Debbe Odea, MD  [facility] 01/13/2018, 1:46 PM Pager   If 7PM-7AM, please contact night-coverage [email or web address] Password [email or web address]

## 2018-01-13 NOTE — Progress Notes (Signed)
EEG reported normal. No focality or seizures. No new neurology recs. Please refer to Dr. Cecil Cobbs  consult for recommendations.  Please call neurology with questions  -- Amie Portland, MD [facility] Pager: [phone] If 7pm to 7am, please call on call as listed on AMION.

## 2018-01-13 NOTE — Progress Notes (Signed)
  Echocardiogram 2D Echocardiogram has been attempted. Patient eating will attempt another time.  Randa Lynn Tomara Youngberg 01/13/2018, 12:11 PM

## 2018-01-13 NOTE — Progress Notes (Signed)
Patient discharged home. Discharge instructions were reviewed with the patient. Patient verbalized understanding.  

## 2018-01-13 NOTE — Care Management Note (Signed)
Case Management Note  Patient Details  Name: KHAMERON GRUENWALD MRN: [id number] Date of Birth: December 28, 1929  Subjective/Objective:     Pt in with near syncope. He is from home with spouse.               Action/Plan: Pt discharging home with self care. Pt has PCP, insurance and transportation home.    Expected Discharge Date:  01/13/18               Expected Discharge Plan:  Home/Self Care  In-House Referral:     Discharge planning Services     Post Acute Care Choice:    Choice offered to:     DME Arranged:    DME Agency:     HH Arranged:    HH Agency:     Status of Service:  Completed, signed off  If discussed at [employer] of Stay Meetings, dates discussed:    Additional Comments:  Pollie Friar, RN 01/13/2018, 1:48 PM

## 2018-01-13 NOTE — ED Notes (Signed)
Patient transported to MRI 
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
## 2018-02-08 ENCOUNTER — Other Ambulatory Visit: Payer: Self-pay

## 2018-02-08 ENCOUNTER — Emergency Department (HOSPITAL_COMMUNITY): Payer: Medicare Other

## 2018-02-08 ENCOUNTER — Inpatient Hospital Stay (HOSPITAL_COMMUNITY)
Admission: EM | Admit: 2018-02-08 | Discharge: 2018-02-10 | DRG: 194 | Disposition: A | Discharge status: Home Health | Payer: Medicare Other | Attending: Internal Medicine | Admitting: Internal Medicine

## 2018-02-08 ENCOUNTER — Encounter (HOSPITAL_COMMUNITY): Payer: Self-pay | Admitting: *Deleted

## 2018-02-08 DIAGNOSIS — J189 Pneumonia, unspecified organism: Secondary | ICD-10-CM | POA: Diagnosis present

## 2018-02-08 DIAGNOSIS — R413 Other amnesia: Secondary | ICD-10-CM | POA: Diagnosis not present

## 2018-02-08 DIAGNOSIS — F0390 Unspecified dementia without behavioral disturbance: Secondary | ICD-10-CM | POA: Diagnosis present

## 2018-02-08 DIAGNOSIS — Y95 Nosocomial condition: Secondary | ICD-10-CM | POA: Diagnosis present

## 2018-02-08 DIAGNOSIS — D72829 Elevated white blood cell count, unspecified: Secondary | ICD-10-CM

## 2018-02-08 DIAGNOSIS — E785 Hyperlipidemia, unspecified: Secondary | ICD-10-CM | POA: Diagnosis present

## 2018-02-08 DIAGNOSIS — Z7951 Long term (current) use of inhaled steroids: Secondary | ICD-10-CM | POA: Diagnosis not present

## 2018-02-08 DIAGNOSIS — I2510 Atherosclerotic heart disease of native coronary artery without angina pectoris: Secondary | ICD-10-CM | POA: Diagnosis present

## 2018-02-08 DIAGNOSIS — I252 Old myocardial infarction: Secondary | ICD-10-CM | POA: Diagnosis not present

## 2018-02-08 DIAGNOSIS — D696 Thrombocytopenia, unspecified: Secondary | ICD-10-CM | POA: Diagnosis present

## 2018-02-08 DIAGNOSIS — R509 Fever, unspecified: Secondary | ICD-10-CM

## 2018-02-08 DIAGNOSIS — D638 Anemia in other chronic diseases classified elsewhere: Secondary | ICD-10-CM | POA: Diagnosis present

## 2018-02-08 DIAGNOSIS — I10 Essential (primary) hypertension: Secondary | ICD-10-CM | POA: Diagnosis not present

## 2018-02-08 DIAGNOSIS — Z886 Allergy status to analgesic agent status: Secondary | ICD-10-CM

## 2018-02-08 DIAGNOSIS — D649 Anemia, unspecified: Secondary | ICD-10-CM | POA: Diagnosis not present

## 2018-02-08 DIAGNOSIS — Z79899 Other long term (current) drug therapy: Secondary | ICD-10-CM | POA: Diagnosis not present

## 2018-02-08 DIAGNOSIS — Z8619 Personal history of other infectious and parasitic diseases: Secondary | ICD-10-CM | POA: Diagnosis not present

## 2018-02-08 DIAGNOSIS — I350 Nonrheumatic aortic (valve) stenosis: Secondary | ICD-10-CM | POA: Diagnosis present

## 2018-02-08 DIAGNOSIS — I110 Hypertensive heart disease with heart failure: Secondary | ICD-10-CM | POA: Diagnosis present

## 2018-02-08 DIAGNOSIS — Z7902 Long term (current) use of antithrombotics/antiplatelets: Secondary | ICD-10-CM | POA: Diagnosis not present

## 2018-02-08 DIAGNOSIS — R6889 Other general symptoms and signs: Secondary | ICD-10-CM | POA: Diagnosis not present

## 2018-02-08 DIAGNOSIS — Z955 Presence of coronary angioplasty implant and graft: Secondary | ICD-10-CM | POA: Diagnosis not present

## 2018-02-08 DIAGNOSIS — N3000 Acute cystitis without hematuria: Secondary | ICD-10-CM

## 2018-02-08 DIAGNOSIS — J181 Lobar pneumonia, unspecified organism: Secondary | ICD-10-CM | POA: Diagnosis not present

## 2018-02-08 DIAGNOSIS — J440 Chronic obstructive pulmonary disease with acute lower respiratory infection: Secondary | ICD-10-CM | POA: Diagnosis present

## 2018-02-08 DIAGNOSIS — I5032 Chronic diastolic (congestive) heart failure: Secondary | ICD-10-CM | POA: Diagnosis present

## 2018-02-08 DIAGNOSIS — Z8673 Personal history of transient ischemic attack (TIA), and cerebral infarction without residual deficits: Secondary | ICD-10-CM | POA: Diagnosis not present

## 2018-02-08 LAB — URINALYSIS, COMPLETE (UACMP) WITH MICROSCOPIC
GLUCOSE, UA: NEGATIVE mg/dL
HGB URINE DIPSTICK: NEGATIVE
KETONES UR: NEGATIVE mg/dL
NITRITE: NEGATIVE
PH: 5 (ref 5.0–8.0)
PROTEIN: NEGATIVE mg/dL
Specific Gravity, Urine: 1.03 (ref 1.005–1.030)

## 2018-02-08 LAB — CBC WITH DIFFERENTIAL/PLATELET
BASOS PCT: 0 %
Basophils Absolute: 0 10*3/uL (ref 0.0–0.1)
EOS ABS: 0 10*3/uL (ref 0.0–0.7)
Eosinophils Relative: 0 %
HEMATOCRIT: 34.3 % — AB (ref 39.0–52.0)
HEMOGLOBIN: 10.7 g/dL — AB (ref 13.0–17.0)
Lymphocytes Relative: 9 %
Lymphs Abs: 1.7 10*3/uL (ref 0.7–4.0)
MCH: 27.9 pg (ref 26.0–34.0)
MCHC: 31.2 g/dL (ref 30.0–36.0)
MCV: 89.6 fL (ref 78.0–100.0)
MONOS PCT: 7 %
Monocytes Absolute: 1.3 10*3/uL — ABNORMAL HIGH (ref 0.1–1.0)
NEUTROS ABS: 15.1 10*3/uL — AB (ref 1.7–7.7)
Neutrophils Relative %: 84 %
Platelets: 198 10*3/uL (ref 150–400)
RBC: 3.83 MIL/uL — ABNORMAL LOW (ref 4.22–5.81)
RDW: 14.1 % (ref 11.5–15.5)
WBC: 18 10*3/uL — AB (ref 4.0–10.5)

## 2018-02-08 LAB — COMPREHENSIVE METABOLIC PANEL
ALK PHOS: 129 U/L — AB (ref 38–126)
ALT: 20 U/L (ref 17–63)
AST: 26 U/L (ref 15–41)
Albumin: 3.7 g/dL (ref 3.5–5.0)
Anion gap: 9 (ref 5–15)
BUN: 17 mg/dL (ref 6–20)
CALCIUM: 8.1 mg/dL — AB (ref 8.9–10.3)
CHLORIDE: 106 mmol/L (ref 101–111)
CO2: 24 mmol/L (ref 22–32)
CREATININE: 1.21 mg/dL (ref 0.61–1.24)
GFR, EST NON AFRICAN AMERICAN: 53 mL/min — AB (ref >60)
Glucose, Bld: 105 mg/dL — ABNORMAL HIGH (ref 65–99)
Potassium: 3.7 mmol/L (ref 3.5–5.1)
SODIUM: 139 mmol/L (ref 135–145)
Total Bilirubin: 0.7 mg/dL (ref 0.3–1.2)
Total Protein: 6 g/dL — ABNORMAL LOW (ref 6.5–8.1)

## 2018-02-08 LAB — I-STAT CG4 LACTIC ACID, ED: Lactic Acid, Venous: 2.3 mmol/L (ref 0.5–1.9)

## 2018-02-08 LAB — BRAIN NATRIURETIC PEPTIDE: B Natriuretic Peptide: 290.9 pg/mL — ABNORMAL HIGH (ref 0.0–100.0)

## 2018-02-08 LAB — I-STAT TROPONIN, ED: Troponin i, poc: 0.02 ng/mL (ref 0.00–0.08)

## 2018-02-08 LAB — PROCALCITONIN: Procalcitonin: 0.19 ng/mL

## 2018-02-08 MED ORDER — ENOXAPARIN SODIUM 40 MG/0.4ML ~~LOC~~ SOLN
40.0000 mg | SUBCUTANEOUS | Status: DC
  Administered 2018-02-09 – 2018-02-10 (×2): 40 mg via SUBCUTANEOUS
  Filled 2018-02-08 (×4): qty 0.4

## 2018-02-08 MED ORDER — SODIUM CHLORIDE 0.9 % IV SOLN
500.0000 mg | INTRAVENOUS | Status: DC
  Administered 2018-02-09: 500 mg via INTRAVENOUS
  Filled 2018-02-08: qty 500

## 2018-02-08 MED ORDER — SODIUM CHLORIDE 0.9 % IV SOLN: 1.0000 g | Freq: Once | INTRAVENOUS | Status: DC

## 2018-02-08 MED ORDER — METOCLOPRAMIDE HCL 5 MG PO TABS
5.0000 mg | ORAL_TABLET | Freq: Three times a day (TID) | ORAL | Status: DC
  Administered 2018-02-08 – 2018-02-10 (×5): 5 mg via ORAL
  Filled 2018-02-08 (×6): qty 1

## 2018-02-08 MED ORDER — SODIUM CHLORIDE 0.9 % IV SOLN
1.0000 g | INTRAVENOUS | Status: DC
  Administered 2018-02-08 – 2018-02-09 (×2): 1 g via INTRAVENOUS
  Filled 2018-02-08 (×3): qty 10

## 2018-02-08 MED ORDER — MONTELUKAST SODIUM 10 MG PO TABS
10.0000 mg | ORAL_TABLET | Freq: Every day | ORAL | Status: DC
  Administered 2018-02-08 – 2018-02-09 (×2): 10 mg via ORAL
  Filled 2018-02-08 (×3): qty 1

## 2018-02-08 MED ORDER — SODIUM CHLORIDE 0.9 % IV SOLN
2.0000 g | Freq: Once | INTRAVENOUS | Status: AC
  Administered 2018-02-08: 2 g via INTRAVENOUS
  Filled 2018-02-08: qty 2

## 2018-02-08 MED ORDER — VANCOMYCIN HCL IN DEXTROSE 1-5 GM/200ML-% IV SOLN
1000.0000 mg | Freq: Once | INTRAVENOUS | Status: AC
  Administered 2018-02-08: 1000 mg via INTRAVENOUS
  Filled 2018-02-08: qty 200

## 2018-02-08 MED ORDER — VANCOMYCIN HCL IN DEXTROSE 750-5 MG/150ML-% IV SOLN: 750.0000 mg | INTRAVENOUS | Status: DC

## 2018-02-08 MED ORDER — PANTOPRAZOLE SODIUM 40 MG PO TBEC
40.0000 mg | DELAYED_RELEASE_TABLET | Freq: Every day | ORAL | Status: DC
  Administered 2018-02-09 – 2018-02-10 (×2): 40 mg via ORAL
  Filled 2018-02-08 (×2): qty 1

## 2018-02-08 MED ORDER — MEMANTINE HCL 10 MG PO TABS
10.0000 mg | ORAL_TABLET | Freq: Two times a day (BID) | ORAL | Status: DC
  Administered 2018-02-08 – 2018-02-10 (×4): 10 mg via ORAL
  Filled 2018-02-08 (×5): qty 1

## 2018-02-08 MED ORDER — CLOPIDOGREL BISULFATE 75 MG PO TABS
75.0000 mg | ORAL_TABLET | Freq: Every day | ORAL | Status: DC
  Administered 2018-02-09 – 2018-02-10 (×2): 75 mg via ORAL
  Filled 2018-02-08 (×2): qty 1

## 2018-02-08 MED ORDER — SODIUM CHLORIDE 0.9 % IV SOLN
INTRAVENOUS | Status: DC
  Administered 2018-02-08: 23:00:00 via INTRAVENOUS

## 2018-02-08 MED ORDER — ACETAMINOPHEN 325 MG PO TABS: 650.0000 mg | ORAL_TABLET | Freq: Four times a day (QID) | ORAL | Status: DC | PRN

## 2018-02-08 MED ORDER — SODIUM CHLORIDE 0.9 % IV BOLUS (SEPSIS)
500.0000 mL | Freq: Once | INTRAVENOUS | Status: AC
  Administered 2018-02-08: 500 mL via INTRAVENOUS

## 2018-02-08 MED ORDER — AZITHROMYCIN 250 MG PO TABS
500.0000 mg | ORAL_TABLET | Freq: Once | ORAL | Status: AC
  Administered 2018-02-08: 500 mg via ORAL
  Filled 2018-02-08: qty 2

## 2018-02-08 MED ORDER — SODIUM CHLORIDE 0.9 % IV SOLN
INTRAVENOUS | Status: DC
  Administered 2018-02-08: 17:00:00 via INTRAVENOUS

## 2018-02-08 NOTE — ED Notes (Signed)
Ordered regular tray
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
## 2018-02-08 NOTE — ED Notes (Signed)
Assisted to lobby restroom. UA obtained. Pt steady on feet.

## 2018-02-08 NOTE — Progress Notes (Signed)
Pharmacy Antibiotic Note  Edward [name] is a 84 y.o. male admitted on 02/08/2018 with pneumonia and UTI.  Pharmacy has been consulted for Vancomycin dosing. WBC elevated at 18. SCr 1.21. Here with malaise, fever and chills. MD dosing Cefepime x1 dose and Azithromycin x1. Last weight in Feb 49.9 kg.  Estimated CrCl ~31.5 mL/min.   Plan: Vancomycin 1g IV now, then 750 mg IV every 24 hours.  Monitor renal function, culture results, and clinical status  Temp (24hrs), Avg:98.5 F (36.9 C), Min:98.1 F (36.7 C), Max:98.8 F (37.1 C)  Recent Labs  Lab 02/08/18 1416  WBC 18.0*  CREATININE 1.21    CrCl cannot be calculated (Unknown ideal weight.).    Allergies  Allergen Reactions  . Aspirin Rash    Antimicrobials this admission: Vanc 3/20 >> Cefepime 3/20 x1  Azithromycin 3/20 x1  Dose adjustments this admission:   Microbiology results: 3/20 Blood >> 3/20 Urine >>  Thank you for allowing pharmacy to be a part of this patient's care.  Sloan Leiter, PharmD, BCPS, BCCCP Clinical Pharmacist Clinical phone 02/08/2018 until 11PM [phone] After hours, please call #[phone] 02/08/2018 4:59 PM

## 2018-02-08 NOTE — ED Triage Notes (Signed)
Sent to ED for eval of fever and shaking that start this am around 3:30am. Pt wife took pt to pcp this am and told to come to ED for further eval

## 2018-02-08 NOTE — ED Notes (Signed)
Attempted report X1

## 2018-02-08 NOTE — ED Provider Notes (Signed)
Patient placed in Quick Look pathway, seen and evaluated   Chief Complaint: weakness   HPI:   Chills, fever (100.4), weakness onset at 3 am today saw PCP (Dr Forde Dandy at [facility]) who obtained lab remarkable for elevated WBC. Electrolytes, creatinine, flu, urine negative today. sent to ED for evaluation. Last APAP 0900.   ROS: Denies changes in appetite, cough, sore throat, nausea, vomiting, diarrhea, changes in urine.  Physical Exam:   Gen: No distress  Neuro: Awake and Alert  Skin: Warm    Focused Exam: H/o dementia, AxO to self, place, time and events. RRR. Lungs CTAB. Abdomen soft NTND.   Initiation of care has begun. The patient has been counseled on the process, plan, and necessity for staying for the completion/evaluation, and the remainder of the medical screening examination    Arlean Hopping 02/08/18 1418    Jola Schmidt, MD 02/08/18 1622
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
## 2018-02-09 ENCOUNTER — Inpatient Hospital Stay (HOSPITAL_COMMUNITY): Payer: Medicare Other

## 2018-02-09 ENCOUNTER — Encounter (HOSPITAL_COMMUNITY): Payer: Self-pay

## 2018-02-09 DIAGNOSIS — D649 Anemia, unspecified: Secondary | ICD-10-CM

## 2018-02-09 DIAGNOSIS — I10 Essential (primary) hypertension: Secondary | ICD-10-CM

## 2018-02-09 DIAGNOSIS — D696 Thrombocytopenia, unspecified: Secondary | ICD-10-CM

## 2018-02-09 DIAGNOSIS — J181 Lobar pneumonia, unspecified organism: Principal | ICD-10-CM

## 2018-02-09 LAB — URINE CULTURE: Culture: <10000 — AB

## 2018-02-09 LAB — CBC
HCT: 29.1 % — ABNORMAL LOW (ref 39.0–52.0)
HEMOGLOBIN: 9.1 g/dL — AB (ref 13.0–17.0)
MCH: 27.5 pg (ref 26.0–34.0)
MCHC: 31.3 g/dL (ref 30.0–36.0)
MCV: 87.9 fL (ref 78.0–100.0)
PLATELETS: 142 10*3/uL — AB (ref 150–400)
RBC: 3.31 MIL/uL — ABNORMAL LOW (ref 4.22–5.81)
RDW: 14.1 % (ref 11.5–15.5)
WBC: 10.4 10*3/uL (ref 4.0–10.5)

## 2018-02-09 LAB — BASIC METABOLIC PANEL
ANION GAP: 6 (ref 5–15)
BUN: 15 mg/dL (ref 6–20)
CO2: 25 mmol/L (ref 22–32)
CREATININE: 1.08 mg/dL (ref 0.61–1.24)
Calcium: 7.5 mg/dL — ABNORMAL LOW (ref 8.9–10.3)
Chloride: 110 mmol/L (ref 101–111)
GLUCOSE: 98 mg/dL (ref 65–99)
Potassium: 3.5 mmol/L (ref 3.5–5.1)
Sodium: 141 mmol/L (ref 135–145)

## 2018-02-09 LAB — STREP PNEUMONIAE URINARY ANTIGEN: STREP PNEUMO URINARY ANTIGEN: NEGATIVE

## 2018-02-09 LAB — LACTIC ACID, PLASMA
LACTIC ACID, VENOUS: 0.6 mmol/L (ref 0.5–1.9)
LACTIC ACID, VENOUS: 0.7 mmol/L (ref 0.5–1.9)

## 2018-02-09 MED ORDER — SENNA 8.6 MG PO TABS
2.0000 | ORAL_TABLET | Freq: Every day | ORAL | Status: DC | PRN
  Administered 2018-02-10: 17.2 mg via ORAL
  Filled 2018-02-09: qty 2

## 2018-02-09 NOTE — Evaluation (Signed)
Physical Therapy Evaluation Patient Details Name: Edward [name] MRN: [id number] DOB: 02/10/1930 Today's Date: 02/09/2018   History of Present Illness  84 year old married male with PMH of CAD, chronic diastolic CHF, COPD, HLD, HTN, stroke, memory issues/dementia, E. coli bacteremia November 2018, hospitalized 2/21-2/22/19 for syncope, presented to ED with progressive weakness over week prior to admission, on day of admission noted fever >100, rigors, seen by PCP, flu negative in office, leukocytosis, admitted for possible community-acquired pneumonia.    Clinical Impression  Pt admitted with above diagnosis. Pt currently with functional limitations due to the deficits listed below (see PT Problem List). Pt was able to ambulate in hallway with min assist and slightly unbalanced at times needing assist.  Has RW at home and PT encouraged pt to use it initially.  Overall did well but will need to use RW and wife to be present.   Pt will benefit from skilled PT to increase their independence and safety with mobility to allow discharge to the venue listed below.      Follow Up Recommendations Home health PT;Supervision/Assistance - 24 hour    Equipment Recommendations  None recommended by PT    Recommendations for Other Services       Precautions / Restrictions Precautions Precautions: Fall Restrictions Weight Bearing Restrictions: No      Mobility  Bed Mobility Overal bed mobility: Independent                Transfers Overall transfer level: Needs assistance   Transfers: Sit to/from Stand Sit to Stand: Min guard         General transfer comment: steadying assist once up  Ambulation/Gait Ambulation/Gait assistance: Min guard;Min assist Ambulation Distance (Feet): 125 Feet Assistive device: None Gait Pattern/deviations: Step-through pattern;Decreased stride length;Staggering right;Staggering left;Decreased dorsiflexion - left   Gait velocity interpretation: <1.8  ft/sec, indicative of risk for recurrent falls General Gait Details: Pt at times losing balance with ambulation needing steadying assist and even min assist x1.  Pt states he is at his baseline but feel that he is slightly worse as has not fallen in past and would have fallen if PT had not steadied him.  Pt sats 96% and greater on RA.     Stairs            Wheelchair Mobility    Modified Rankin (Stroke Patients Only)       Balance Overall balance assessment: Needs assistance Sitting-balance support: No upper extremity supported;Feet supported Sitting balance-Leahy Scale: Fair     Standing balance support: No upper extremity supported;During functional activity Standing balance-Leahy Scale: Fair Standing balance comment: can stand statically without UE support for up to aminute                             Pertinent Vitals/Pain Pain Assessment: No/denies pain    Home Living Family/patient expects to be discharged to:: Private residence Living Arrangements: Spouse/significant other Available Help at Discharge: Family;Available 24 hours/day Type of Home: House Home Access: Stairs to enter Entrance Stairs-Rails: Psychiatric nurse of Steps: 3 Home Layout: One level Home Equipment: Walker - 2 wheels;Cane - single point;Bedside commode;Shower seat      Prior Function Level of Independence: Needs assistance   Gait / Transfers Assistance Needed: generally independent in the home ambulating without an AD  ADL's / Homemaking Assistance Needed: manages most of his ADL's--not specified        Hand Dominance  Dominant Hand: Right    Extremity/Trunk Assessment   Upper Extremity Assessment Upper Extremity Assessment: Defer to OT evaluation    Lower Extremity Assessment Lower Extremity Assessment: Generalized weakness       Communication   Communication: No difficulties  Cognition Arousal/Alertness: Awake/alert Behavior During Therapy:  WFL for tasks assessed/performed Overall Cognitive Status: Within Functional Limits for tasks assessed                                        General Comments      Exercises     Assessment/Plan    PT Assessment Patient needs continued PT services  PT Problem List Decreased activity tolerance;Decreased balance;Decreased mobility;Decreased knowledge of use of DME;Decreased safety awareness;Decreased knowledge of precautions       PT Treatment Interventions DME instruction;Gait training;Functional mobility training;Therapeutic activities;Therapeutic exercise;Balance training;Patient/family education;Stair training    PT Goals (Current goals can be found in the Care Plan section)  Acute Rehab PT Goals Patient Stated Goal: to go home PT Goal Formulation: With patient Time For Goal Achievement: 02/23/18 Potential to Achieve Goals: Good    Frequency Min 3X/week   Barriers to discharge        Co-evaluation               AM-PAC PT "6 Clicks" Daily Activity  Outcome Measure Difficulty turning over in bed (including adjusting bedclothes, sheets and blankets)?: None Difficulty moving from lying on back to sitting on the side of the bed? : None Difficulty sitting down on and standing up from a chair with arms (e.g., wheelchair, bedside commode, etc,.)?: A Little Help needed moving to and from a bed to chair (including a wheelchair)?: A Little Help needed walking in hospital room?: A Little Help needed climbing 3-5 steps with a railing? : A Little 6 Click Score: 20    End of Session Equipment Utilized During Treatment: Gait belt Activity Tolerance: Patient limited by fatigue Patient left: in chair;with call bell/phone within reach;with chair alarm set Nurse Communication: Mobility status PT Visit Diagnosis: Unsteadiness on feet (R26.81)    Time: 3244-0102 PT Time Calculation (min) (ACUTE ONLY): 22 min   Charges:   PT Evaluation $PT Eval Moderate  Complexity: 1 Mod     PT G Codes:        Carlton Bryn Saline,PT Acute Rehabilitation [phone] (pager)   Denice Paradise 02/09/2018, 1:47 PM

## 2018-02-09 NOTE — Progress Notes (Signed)
PROGRESS NOTE   Edward [name]  OJJ:[id number]    DOB: 1930-06-19    DOA: 02/08/2018  PCP: [name], MD   I have briefly reviewed patients previous medical records in [facility].  Brief Narrative:  84 year old married male with PMH of CAD, chronic diastolic CHF, COPD, HLD, HTN, stroke, memory issues/dementia, E. coli bacteremia November 2018, hospitalized 2/21-2/22/19 for syncope, presented to ED with progressive weakness over week prior to admission, on day of admission noted fever >100, rigors, seen by PCP, flu negative in office, leukocytosis, admitted for possible community-acquired pneumonia.  Improving.   Assessment & Plan:   Active Problems:   Essential hypertension   Memory disorder   Pneumonia   Rigors   Leukocytosis   Fever   Community-acquired pneumonia/lobar pneumonia (right upper lobe): Flu testing in PCPs office reportedly negative.  Mildly elevated lactate has normalized.  Pneumococcal urinary antigen negative.  Pro-calcitonin 0.19.  Blood cultures x2 pending.  Continue empirically started IV ceftriaxone and azithromycin.  Recommend repeating chest x-ray in 4 weeks to ensure resolution of pneumonia findings.  No urinary symptoms and urine culture shows insignificant growth and hence no UTI.  Essential hypertension: Controlled.  Home meds currently on hold and consider resuming as blood pressures increase or at discharge.  Normocytic anemia: Likely chronic disease and probably at baseline.  Follow CBC in a.m.  Thrombocytopenia: Appears chronic, mild and intermittent.  Now may be related to acute infection.  Follow CBC in a.m.  CAD status post PCI/chronic diastolic CHF: Clinically compensated.  History of stroke: Continue clopidogrel.  COPD: Stable without clinical bronchospasm.  Dementia: Stable without behavioral abnormalities.   DVT prophylaxis: Lovenox Code Status: Full Family Communication: None at bedside Disposition: DC home pending clinical  improvement, possibly 3/22.   Consultants:  None  Procedures:  None  Antimicrobials:  IV ceftriaxone and azithromycin 3/20 >   Subjective: States that he feels much better and almost back to his baseline.  Denies cough, chest pain, fever or chills.  Denies pain anywhere.  As per RN, no acute issues reported.  ROS: As above  Objective:  Vitals:   02/08/18 2045 02/08/18 2100 02/08/18 2145 02/09/18 0529  BP: (!) 116/50 (!) 104/55 (!) 120/52 (!) 115/52  Pulse: 74 65  74  Resp: 18 13  16   Temp:   98.3 F (36.8 C) 98.1 F (36.7 C)  TempSrc:   Oral Oral  SpO2: 100% 100%  97%  Weight:   51.2 kg (112 lb 14 oz)   Height:   5\' 1"  (1.549 m)     Examination:  General exam: Pleasant elderly male, moderately built and nourished, sitting up comfortably in bed eating his breakfast this morning. Respiratory system: Clear to auscultation. Respiratory effort normal. Cardiovascular system: S1 & S2 heard, RRR. No JVD, murmurs, rubs, gallops or clicks. No pedal edema.  Telemetry personally reviewed: Sinus rhythm with first-degree AV block. Gastrointestinal system: Abdomen is nondistended, soft and nontender. No organomegaly or masses felt. Normal bowel sounds heard. Central nervous system: Alert and oriented x2. No focal neurological deficits. Extremities: Symmetric 5 x 5 power. Skin: No rashes, lesions or ulcers Psychiatry: Judgement and insight impaired. Mood & affect appropriate.     Data Reviewed: I have personally reviewed following labs and imaging studies  CBC: Recent Labs  Lab 02/08/18 1416 02/09/18 0230  WBC 18.0* 10.4  NEUTROABS 15.1*  --   HGB 10.7* 9.1*  HCT 34.3* 29.1*  MCV 89.6 87.9  PLT 198 142*  Basic Metabolic Panel: Recent Labs  Lab 02/08/18 1416 02/09/18 0230  NA 139 141  K 3.7 3.5  CL 106 110  CO2 24 25  GLUCOSE 105* 98  BUN 17 15  CREATININE 1.21 1.08  CALCIUM 8.1* 7.5*   Liver Function Tests: Recent Labs  Lab 02/08/18 1416  AST 26  ALT  20  ALKPHOS 129*  BILITOT 0.7  PROT 6.0*  ALBUMIN 3.7     Recent Results (from the past 240 hour(s))  Urine culture     Status: Abnormal   Collection Time: 02/08/18  3:26 PM  Result Value Ref Range Status   Specimen Description URINE, CLEAN CATCH  Final   Special Requests NONE  Final   Culture (A)  Final    <10,000 COLONIES/mL INSIGNIFICANT GROWTH Performed at [facility], [place] [address], [place], [place] [address]    Report Status 02/09/2018 FINAL  Final         Radiology Studies: Dg Chest 2 View  Result Date: 02/09/2018 CLINICAL DATA:  Pneumonia EXAM: CHEST - 2 VIEW COMPARISON:  02/08/2018 FINDINGS: Cardiac shadow is prominent accentuated by the frontal technique. Previously seen patchy changes in the right upper lobe are again noted and stable. No new infiltrate is seen. Small bilateral pleural effusions are now noted new from the prior exam. No acute bony abnormality is seen. IMPRESSION: Stable right upper lobe infiltrate. New bilateral small effusions. Electronically Signed   By: Inez Catalina M.D.   On: 02/09/2018 09:59   Dg Chest 2 View  Result Date: 02/08/2018 CLINICAL DATA:  Fever.  Weakness. EXAM: CHEST - 2 VIEW COMPARISON:  01/13/2018 and 09/18/2017 FINDINGS: There is a small hazy infiltrate in the right midzone. The lungs are otherwise clear. Heart size and vascularity are normal. Aortic atherosclerosis. No acute bone abnormality. Coronary stents in place. IMPRESSION: Small faint area of infiltrate in the right midzone. This is consistent with pneumonia. Aortic Atherosclerosis (ICD10-I70.0). Electronically Signed   By: Lorriane Shire M.D.   On: 02/08/2018 15:56        Scheduled Meds: . clopidogrel  75 mg Oral Daily  . enoxaparin (LOVENOX) injection  40 mg Subcutaneous Q24H  . memantine  10 mg Oral BID  . metoCLOPramide  5 mg Oral TID AC & HS  . montelukast  10 mg Oral QHS  . pantoprazole  40 mg Oral Daily   Continuous Infusions: . azithromycin Stopped  (02/08/18 2143)  . cefTRIAXone (ROCEPHIN)  IV Stopped (02/09/18 0021)     LOS: 1 day     Vernell Leep, MD, FACP, [facility]. [facility] Pager [phone]  If 7PM-7AM, please contact night-coverage [email or web address] Password [facility] 02/09/2018, 11:41 AM

## 2018-02-10 LAB — CBC
HCT: 28.4 % — ABNORMAL LOW (ref 39.0–52.0)
Hemoglobin: 9.2 g/dL — ABNORMAL LOW (ref 13.0–17.0)
MCH: 28.3 pg (ref 26.0–34.0)
MCHC: 32.4 g/dL (ref 30.0–36.0)
MCV: 87.4 fL (ref 78.0–100.0)
PLATELETS: 161 10*3/uL (ref 150–400)
RBC: 3.25 MIL/uL — ABNORMAL LOW (ref 4.22–5.81)
RDW: 14.1 % (ref 11.5–15.5)
WBC: 8.7 10*3/uL (ref 4.0–10.5)

## 2018-02-10 MED ORDER — MAGNESIUM OXIDE 400 (241.3 MG) MG PO TABS: 400.0000 mg | ORAL_TABLET | ORAL | Status: DC

## 2018-02-10 MED ORDER — AMOXICILLIN 500 MG PO TABS: 500.0000 mg | ORAL_TABLET | Freq: Three times a day (TID) | ORAL | 0 refills | Status: DC

## 2018-02-10 MED ORDER — AZITHROMYCIN 500 MG PO TABS: 500.0000 mg | ORAL_TABLET | Freq: Once | ORAL | 0 refills | Status: AC

## 2018-02-10 MED ORDER — AZITHROMYCIN 250 MG PO TABS: 500.0000 mg | ORAL_TABLET | Freq: Every day | ORAL | Status: DC

## 2018-02-10 NOTE — Progress Notes (Signed)
Discharge paperwork, instructions, and medications reviewed w/ pt and pts wife. Pt and pts wife state no questions or concerns. Pt taken downstairs by wheelchair. D/c'd to home w/ home health.

## 2018-02-10 NOTE — Care Management Note (Addendum)
Case Management Note  Patient Details  Name: Edward [name] MRN: [id number] Date of Birth: 06-12-1930  Subjective/Objective:    From home with wife, presents with pna, ?uti, per pt eval rec HHPT,  Wife chose [facility] from [facility] list, referral given to [facility] with [facility] , soc will begin 24-48 hrs post dc.  He has a walker, a cane a shower chair at home. He has PCP and medication coverage.             Action/Plan: DC home with HHPT with [facility] .  Expected Discharge Date:                  Expected Discharge Plan:  [place]  In-House Referral:     Discharge planning Services  CM Consult  Post Acute Care Choice:  Home Health Choice offered to:  Spouse  DME Arranged:    DME Agency:     HH Arranged:  PT [place]:  [place]  Status of Service:  Completed, signed off  If discussed at [place] of Stay Meetings, dates discussed:    Additional Comments:  Zenon Mayo, RN 02/10/2018, 10:36 AM

## 2018-02-10 NOTE — Progress Notes (Signed)
PHARMACIST - PHYSICIAN COMMUNICATION DR:   Algis Liming CONCERNING: Antibiotic IV to Oral Route Change Policy  RECOMMENDATION: This patient is receiving Azithromycin by the intravenous route.  Based on criteria approved by the Pharmacy and Therapeutics Committee, the antibiotic(s) is/are being converted to the equivalent oral dose form(s).   DESCRIPTION: These criteria include:  Patient being treated for a respiratory tract infection, urinary tract infection, cellulitis or clostridium difficile associated diarrhea if on metronidazole  The patient is not neutropenic and does not exhibit a GI malabsorption state  The patient is eating (either orally or via tube) and/or has been taking other orally administered medications for a least 24 hours  The patient is improving clinically and has a Tmax < 100.5  If you have questions about this conversion, please contact the Pharmacy Department  []   [phone] )  [facility] []   [phone] )  [facility] [x]   [phone] )  [facility] []   [phone] )  [facility] []   [phone] )  [place], PharmD, BCPS 10:03 AM

## 2018-02-10 NOTE — Discharge Summary (Signed)
Physician Discharge Summary  EMERICK WEATHERLY HKV:[id number] DOB: Nov 21, 1930  PCP: [name], MD  Admit date: 02/08/2018 Discharge date: 02/10/2018  Recommendations for Outpatient Follow-up:  1. Dr. [name], PCP in 1 week with repeat labs (CBC & BMP). 2. Recommend repeating chest x-ray in 4 weeks to ensure resolution of pneumonia findings.  Home Health: PT Equipment/Devices: None  Discharge Condition: Improved and stable. CODE STATUS: Full Diet recommendation: Heart healthy diet.  Discharge Diagnoses:  Active Problems:   Essential hypertension   Memory disorder   Pneumonia   Rigors   Leukocytosis   Fever   Brief Summary: 84 year old married male with PMH of CAD, chronic diastolic CHF, COPD, HLD, HTN, stroke, memory issues/dementia, E. coli bacteremia November 2018, hospitalized 2/21-2/22/19 for syncope, presented to ED with progressive weakness over week prior to admission, on day of admission noted fever >100, rigors, seen by PCP, flu negative in office, leukocytosis, admitted for possible community-acquired pneumonia.     Assessment & Plan:   Community-acquired pneumonia/lobar pneumonia (right upper lobe): Flu testing in PCPs office reportedly negative.  Mildly elevated lactate has normalized.  Pneumococcal urinary antigen negative.  Pro-calcitonin 0.19.  Blood cultures x2 negative to date. Treated empirically with IV ceftriaxone and azithromycin and completed 2 days of same. Patient is transitioned to oral azithromycin to complete 3 days and amoxicillin to complete 7 days at discharge. Recommend repeating chest x-ray in 4 weeks to ensure resolution of pneumonia findings.  No urinary symptoms and urine culture shows insignificant growth and hence no UTI.  Essential hypertension: Controlled.  Continue ramipril.  Normocytic anemia: Likely chronic disease and probably at baseline.  Stable.  Thrombocytopenia: Appears chronic, mild and intermittent.  Now may be related  to acute infection. Normalized today.  CAD status post PCI/chronic diastolic CHF: Clinically compensated.  Continue statins, Plavix.  History of stroke: Continue clopidogrel and statins.  COPD: Stable without clinical bronchospasm.  Continue prior home medications.  Dementia: Stable without behavioral abnormalities.   Consultants:  None  Procedures:  None    Discharge Instructions  Discharge Instructions    Call MD for:  difficulty breathing, headache or visual disturbances   Complete by:  As directed    Call MD for:  extreme fatigue   Complete by:  As directed    Call MD for:  persistant dizziness or light-headedness   Complete by:  As directed    Call MD for:  temperature >100.4   Complete by:  As directed    Diet - low sodium heart healthy   Complete by:  As directed    Increase activity slowly   Complete by:  As directed        Medication List    TAKE these medications   acetaminophen 325 MG tablet Commonly known as:  TYLENOL Take 2 tablets (650 mg total) by mouth every 6 (six) hours as needed for mild pain (or Fever >/= 101).   ADVAIR DISKUS 250-50 MCG/DOSE Aepb Generic drug:  Fluticasone-Salmeterol Inhale 1 puff into the lungs 2 (two) times daily as needed (SOB).   amoxicillin 500 MG tablet Commonly known as:  AMOXIL Take 1 tablet (500 mg total) by mouth 3 (three) times daily.   atorvastatin 40 MG tablet Commonly known as:  LIPITOR Take 1 tablet (40 mg total) by mouth daily.   azithromycin 500 MG tablet Commonly known as:  ZITHROMAX Take 1 tablet (500 mg total) by mouth once for 1 dose. Take this evening at 6pm.   clopidogrel  75 MG tablet Commonly known as:  PLAVIX TAKE 1 TABLET BY MOUTH DAILY. What changed:    how much to take  how to take this  when to take this  additional instructions   magnesium oxide 400 (241.3 Mg) MG tablet Commonly known as:  MAG-OX Take 1 tablet (400 mg total) by mouth every 7 (seven) days.   memantine  10 MG tablet Commonly known as:  NAMENDA Take 1 tablet (10 mg total) by mouth 2 (two) times daily.   metoCLOPramide 5 MG tablet Commonly known as:  REGLAN Take 1 tablet (5 mg total) by mouth 4 (four) times daily -  before meals and at bedtime.   mometasone 50 MCG/ACT nasal spray Commonly known as:  NASONEX Place 2 sprays into the nose daily as needed (allergies).   pantoprazole 40 MG tablet Commonly known as:  PROTONIX Take 40 mg by mouth daily.   ramipril 2.5 MG capsule Commonly known as:  ALTACE Take 2.5 mg by mouth daily.   zafirlukast 20 MG tablet Commonly known as:  ACCOLATE Take 20 mg by mouth 2 (two) times daily before a meal.      Follow-up Information    [facility].   Specialty:  Home Health Services Why:  HHPT Contact information: [address] [place] [address] [phone]        [name], MD. Schedule an appointment as soon as possible for a visit in 1 week(s).   Specialty:  Endocrinology Why:  To be seen with repeat labs (CBC & BMP).  Recommend repeating chest x-ray in 4 weeks to ensure resolution of pneumonia findings. Contact information: [address] [place]  [address] [phone]          Allergies  Allergen Reactions  . Aspirin Rash      Procedures/Studies: Dg Chest 2 View  Result Date: 02/09/2018 CLINICAL DATA:  Pneumonia EXAM: CHEST - 2 VIEW COMPARISON:  02/08/2018 FINDINGS: Cardiac shadow is prominent accentuated by the frontal technique. Previously seen patchy changes in the right upper lobe are again noted and stable. No new infiltrate is seen. Small bilateral pleural effusions are now noted new from the prior exam. No acute bony abnormality is seen. IMPRESSION: Stable right upper lobe infiltrate. New bilateral small effusions. Electronically Signed   By: Inez Catalina M.D.   On: 02/09/2018 09:59   Dg Chest 2 View  Result Date: 02/08/2018 CLINICAL DATA:  Fever.  Weakness.  EXAM: CHEST - 2 VIEW COMPARISON:  01/13/2018 and 09/18/2017 FINDINGS: There is a small hazy infiltrate in the right midzone. The lungs are otherwise clear. Heart size and vascularity are normal. Aortic atherosclerosis. No acute bone abnormality. Coronary stents in place. IMPRESSION: Small faint area of infiltrate in the right midzone. This is consistent with pneumonia. Aortic Atherosclerosis (ICD10-I70.0). Electronically Signed   By: Lorriane Shire M.D.   On: 02/08/2018 15:56    Subjective: Denies complaints.  Anxious to go home.  Denies fever, chills, dyspnea, chest pain, dizziness or lightheadedness.  Discharge Exam:  Vitals:   02/08/18 2145 02/09/18 0529 02/09/18 1249 02/09/18 2342  BP: (!) 120/52 (!) 115/52 (!) 144/59 (!) 143/53  Pulse:  74 60 76  Resp:  16 20 18   Temp: 98.3 F (36.8 C) 98.1 F (36.7 C) (!) 97.5 F (36.4 C) 98.7 F (37.1 C)  TempSrc: Oral Oral Oral Oral  SpO2:  97% 97% 94%  Weight: 51.2 kg (112 lb 14 oz)     Height:  5\' 1"  (1.549 m)       General exam: Pleasant elderly male, moderately built and nourished, sitting up comfortably in bed. Respiratory system: Clear to auscultation. Respiratory effort normal. Cardiovascular system: S1 & S2 heard, RRR. No JVD, murmurs, rubs, gallops or clicks. No pedal edema.  Gastrointestinal system: Abdomen is nondistended, soft and nontender. No organomegaly or masses felt. Normal bowel sounds heard. Central nervous system: Alert and oriented x2. No focal neurological deficits. Extremities: Symmetric 5 x 5 power. Skin: No rashes, lesions or ulcers Psychiatry: Judgement and insight impaired. Mood & affect appropriate.       The results of significant diagnostics from this hospitalization (including imaging, microbiology, ancillary and laboratory) are listed below for reference.     Microbiology: Recent Results (from the past 240 hour(s))  Blood Culture (routine x 2)     Status: None (Preliminary result)   Collection Time:  02/08/18  2:21 PM  Result Value Ref Range Status   Specimen Description BLOOD SITE NOT SPECIFIED  Final   Special Requests   Final    BOTTLES DRAWN AEROBIC ONLY Blood Culture adequate volume   Culture   Final    NO GROWTH < 24 HOURS Performed at [facility], [address], [place], [place] [address]    Report Status PENDING  Incomplete  Blood Culture (routine x 2)     Status: None (Preliminary result)   Collection Time: 02/08/18  2:40 PM  Result Value Ref Range Status   Specimen Description BLOOD SITE NOT SPECIFIED  Final   Special Requests   Final    BOTTLES DRAWN AEROBIC AND ANAEROBIC Blood Culture adequate volume   Culture   Final    NO GROWTH < 24 HOURS Performed at [facility], [place] [address] [place], [place] [address]    Report Status PENDING  Incomplete  Urine culture     Status: Abnormal   Collection Time: 02/08/18  3:26 PM  Result Value Ref Range Status   Specimen Description URINE, CLEAN CATCH  Final   Special Requests NONE  Final   Culture (A)  Final    <10,000 COLONIES/mL INSIGNIFICANT GROWTH Performed at [facility], [place] [address] [place], [place] [address]    Report Status 02/09/2018 FINAL  Final     Labs: CBC: Recent Labs  Lab 02/08/18 1416 02/09/18 0230 02/10/18 0251  WBC 18.0* 10.4 8.7  NEUTROABS 15.1*  --   --   HGB 10.7* 9.1* 9.2*  HCT 34.3* 29.1* 28.4*  MCV 89.6 87.9 87.4  PLT 198 142* 517   Basic Metabolic Panel: Recent Labs  Lab 02/08/18 1416 02/09/18 0230  NA 139 141  K 3.7 3.5  CL 106 110  CO2 24 25  GLUCOSE 105* 98  BUN 17 15  CREATININE 1.21 1.08  CALCIUM 8.1* 7.5*   Liver Function Tests: Recent Labs  Lab 02/08/18 1416  AST 26  ALT 20  ALKPHOS 129*  BILITOT 0.7  PROT 6.0*  ALBUMIN 3.7   BNP (last 3 results) Recent Labs    02/08/18 1417  BNP 290.9*   Urinalysis    Component Value Date/Time   COLORURINE AMBER (A) 02/08/2018 1416   APPEARANCEUR HAZY (A) 02/08/2018 1416   LABSPEC 1.030  02/08/2018 1416   PHURINE 5.0 02/08/2018 1416   GLUCOSEU NEGATIVE 02/08/2018 1416   HGBUR NEGATIVE 02/08/2018 1416   BILIRUBINUR MODERATE (A) 02/08/2018 1416   KETONESUR NEGATIVE 02/08/2018 1416   PROTEINUR NEGATIVE 02/08/2018  1416   UROBILINOGEN 1.0 01/24/2012 1216   NITRITE NEGATIVE 02/08/2018 1416   LEUKOCYTESUR TRACE (A) 02/08/2018 1416    I discussed with patient's spouse in detail.  Updated care and answered questions.  Time coordinating discharge: Less than 30 minutes  SIGNED:  Vernell Leep, MD, FACP, [facility]. [facility] Pager [phone]  If 7PM-7AM, please contact night-coverage [email or web address] Password [facility] 02/10/2018, 11:25 AM

## 2018-02-10 NOTE — Discharge Instructions (Signed)
Please get your medications reviewed and adjusted by your Primary MD. ° °Please request your Primary MD to go over all Hospital Tests and Procedure/Radiological results at the follow up, please get all Hospital records sent to your Prim MD by signing hospital release before you go home. ° °If you had Pneumonia of Lung problems at the Hospital: °Please get a 2 view Chest X ray done in 6-8 weeks after hospital discharge or sooner if instructed by your Primary MD. ° °If you have Congestive Heart Failure: °Please call your Cardiologist or Primary MD anytime you have any of the following symptoms:  °1) 3 pound weight gain in 24 hours or 5 pounds in 1 week  °2) shortness of breath, with or without a dry hacking cough  °3) swelling in the hands, feet or stomach  °4) if you have to sleep on extra pillows at night in order to breathe ° °Follow cardiac low salt diet and 1.5 lit/day fluid restriction. ° °If you have diabetes °Accuchecks 4 times/day, Once in AM empty stomach and then before each meal. °Log in all results and show them to your primary doctor at your next visit. °If any glucose reading is under 80 or above 300 call your primary MD immediately. ° °If you have Seizure/Convulsions/Epilepsy: °Please do not drive, operate heavy machinery, participate in activities at heights or participate in high speed sports until you have seen by Primary MD or a Neurologist and advised to do so again. ° °If you had Gastrointestinal Bleeding: °Please ask your Primary MD to check a complete blood count within one week of discharge or at your next visit. Your endoscopic/colonoscopic biopsies that are pending at the time of discharge, will also need to followed by your Primary MD. ° °Get Medicines reviewed and adjusted. °Please take all your medications with you for your next visit with your Primary MD ° °Please request your Primary MD to go over all hospital tests and procedure/radiological results at the follow up, please ask your  Primary MD to get all Hospital records sent to his/her office. ° °If you experience worsening of your admission symptoms, develop shortness of breath, life threatening emergency, suicidal or homicidal thoughts you must seek medical attention immediately by calling 911 or calling your MD immediately  if symptoms less severe. ° °You must read complete instructions/literature along with all the possible adverse reactions/side effects for all the Medicines you take and that have been prescribed to you. Take any new Medicines after you have completely understood and accpet all the possible adverse reactions/side effects.  ° °Do not drive or operate heavy machinery when taking Pain medications.  ° °Do not take more than prescribed Pain, Sleep and Anxiety Medications ° °Special Instructions: If you have smoked or chewed Tobacco  in the last 2 yrs please stop smoking, stop any regular Alcohol  and or any Recreational drug use. ° °Wear Seat belts while driving. ° °Please note °You were cared for by a hospitalist during your hospital stay. If you have any questions about your discharge medications or the care you received while you were in the hospital after you are discharged, you can call the unit and asked to speak with the hospitalist on call if the hospitalist that took care of you is not available. Once you are discharged, your primary care physician will handle any further medical issues. Please note that NO REFILLS for any discharge medications will be authorized once you are discharged, as it is imperative that you   return to your primary care physician (or establish a relationship with a primary care physician if you do not have one) for your aftercare needs so that they can reassess your need for medications and monitor your lab values.  You can reach the hospitalist office at phone [phone] or fax [phone]   If you do not have a primary care physician, you can call [phone] for a physician  referral.   Community-Acquired Pneumonia, Adult Pneumonia is an infection of the lungs. One type of pneumonia can happen while a person is in a hospital. A different type can happen when a person is not in a hospital (community-acquired pneumonia). It is easy for this kind to spread from person to person. It can spread to you if you breathe near an infected person who coughs or sneezes. Some symptoms include:  A dry cough.  A wet (productive) cough.  Fever.  Sweating.  Chest pain.  Follow these instructions at home:  Take over-the-counter and prescription medicines only as told by your doctor. ? Only take cough medicine if you are losing sleep. ? If you were prescribed an antibiotic medicine, take it as told by your doctor. Do not stop taking the antibiotic even if you start to feel better.  Sleep with your head and neck raised (elevated). You can do this by putting a few pillows under your head, or you can sleep in a recliner.  Do not use tobacco products. These include cigarettes, chewing tobacco, and e-cigarettes. If you need help quitting, ask your doctor.  Drink enough water to keep your pee (urine) clear or pale yellow. A shot (vaccine) can help prevent pneumonia. Shots are often suggested for:  People older than 82 years of age.  People older than 82 years of age: ? Who are having cancer treatment. ? Who have long-term (chronic) lung disease. ? Who have problems with their body's defense system (immune system).  You may also prevent pneumonia if you take these actions:  Get the flu (influenza) shot every year.  Go to the dentist as often as told.  Wash your hands often. If soap and water are not available, use hand sanitizer.  Contact a doctor if:  You have a fever.  You lose sleep because your cough medicine does not help. Get help right away if:  You are short of breath and it gets worse.  You have more chest pain.  Your sickness gets worse. This is  very serious if: ? You are an older adult. ? Your body's defense system is weak.  You cough up blood. This information is not intended to replace advice given to you by your health care provider. Make sure you discuss any questions you have with your health care provider. Document Released: 04/26/2008 Document Revised: 04/15/2016 Document Reviewed: 03/05/2015 Elsevier Interactive Patient Education  [employer].

## 2018-02-13 LAB — CULTURE, BLOOD (ROUTINE X 2)
Culture: NO GROWTH
Culture: NO GROWTH
SPECIAL REQUESTS: ADEQUATE
Special Requests: ADEQUATE

## 2018-03-05 ENCOUNTER — Emergency Department (HOSPITAL_COMMUNITY): Payer: Medicare Other

## 2018-03-05 ENCOUNTER — Encounter (HOSPITAL_COMMUNITY): Payer: Self-pay | Admitting: *Deleted

## 2018-03-05 ENCOUNTER — Emergency Department (HOSPITAL_COMMUNITY)
Admission: EM | Admit: 2018-03-05 | Discharge: 2018-03-05 | Disposition: A | Discharge status: Home / Self Care | Payer: Medicare Other | Attending: Emergency Medicine | Admitting: Emergency Medicine

## 2018-03-05 ENCOUNTER — Other Ambulatory Visit: Payer: Self-pay

## 2018-03-05 DIAGNOSIS — I2510 Atherosclerotic heart disease of native coronary artery without angina pectoris: Secondary | ICD-10-CM | POA: Insufficient documentation

## 2018-03-05 DIAGNOSIS — I110 Hypertensive heart disease with heart failure: Secondary | ICD-10-CM | POA: Diagnosis not present

## 2018-03-05 DIAGNOSIS — J449 Chronic obstructive pulmonary disease, unspecified: Secondary | ICD-10-CM | POA: Diagnosis not present

## 2018-03-05 DIAGNOSIS — Z79899 Other long term (current) drug therapy: Secondary | ICD-10-CM | POA: Insufficient documentation

## 2018-03-05 DIAGNOSIS — I5032 Chronic diastolic (congestive) heart failure: Secondary | ICD-10-CM | POA: Insufficient documentation

## 2018-03-05 DIAGNOSIS — Z955 Presence of coronary angioplasty implant and graft: Secondary | ICD-10-CM | POA: Insufficient documentation

## 2018-03-05 DIAGNOSIS — R404 Transient alteration of awareness: Secondary | ICD-10-CM

## 2018-03-05 DIAGNOSIS — E860 Dehydration: Secondary | ICD-10-CM | POA: Insufficient documentation

## 2018-03-05 LAB — COMPREHENSIVE METABOLIC PANEL
ALBUMIN: 4.1 g/dL (ref 3.5–5.0)
ALT: 21 U/L (ref 17–63)
ANION GAP: 11 (ref 5–15)
AST: 33 U/L (ref 15–41)
Alkaline Phosphatase: 142 U/L — ABNORMAL HIGH (ref 38–126)
BUN: 19 mg/dL (ref 6–20)
CHLORIDE: 107 mmol/L (ref 101–111)
CO2: 22 mmol/L (ref 22–32)
Calcium: 8.9 mg/dL (ref 8.9–10.3)
Creatinine, Ser: 1.22 mg/dL (ref 0.61–1.24)
GFR calc Af Amer: >60 mL/min (ref >60)
GFR calc non Af Amer: 52 mL/min — ABNORMAL LOW (ref >60)
GLUCOSE: 103 mg/dL — AB (ref 65–99)
POTASSIUM: 4.9 mmol/L (ref 3.5–5.1)
SODIUM: 140 mmol/L (ref 135–145)
Total Bilirubin: 0.8 mg/dL (ref 0.3–1.2)
Total Protein: 6.9 g/dL (ref 6.5–8.1)

## 2018-03-05 LAB — URINALYSIS, COMPLETE (UACMP) WITH MICROSCOPIC
BILIRUBIN URINE: NEGATIVE
Bacteria, UA: NONE SEEN
GLUCOSE, UA: NEGATIVE mg/dL
HGB URINE DIPSTICK: NEGATIVE
Ketones, ur: NEGATIVE mg/dL
LEUKOCYTES UA: NEGATIVE
NITRITE: NEGATIVE
PH: 6 (ref 5.0–8.0)
Protein, ur: NEGATIVE mg/dL
Specific Gravity, Urine: 1.019 (ref 1.005–1.030)

## 2018-03-05 LAB — CBC WITH DIFFERENTIAL/PLATELET
Basophils Absolute: 0 10*3/uL (ref 0.0–0.1)
Basophils Relative: 0 %
EOS ABS: 0.1 10*3/uL (ref 0.0–0.7)
Eosinophils Relative: 1 %
HEMATOCRIT: 37.8 % — AB (ref 39.0–52.0)
HEMOGLOBIN: 12 g/dL — AB (ref 13.0–17.0)
LYMPHS ABS: 1.6 10*3/uL (ref 0.7–4.0)
Lymphocytes Relative: 20 %
MCH: 27.8 pg (ref 26.0–34.0)
MCHC: 31.7 g/dL (ref 30.0–36.0)
MCV: 87.7 fL (ref 78.0–100.0)
MONOS PCT: 4 %
Monocytes Absolute: 0.4 10*3/uL (ref 0.1–1.0)
NEUTROS ABS: 6.2 10*3/uL (ref 1.7–7.7)
NEUTROS PCT: 75 %
Platelets: 216 10*3/uL (ref 150–400)
RBC: 4.31 MIL/uL (ref 4.22–5.81)
RDW: 13.8 % (ref 11.5–15.5)
WBC: 8.3 10*3/uL (ref 4.0–10.5)

## 2018-03-05 LAB — PROTIME-INR
INR: 0.99
Prothrombin Time: 13 seconds (ref 11.4–15.2)

## 2018-03-05 LAB — I-STAT CG4 LACTIC ACID, ED
Lactic Acid, Venous: 3.51 mmol/L (ref 0.5–1.9)
Lactic Acid, Venous: 0.56 mmol/L (ref 0.5–1.9)

## 2018-03-05 MED ORDER — SODIUM CHLORIDE 0.9 % IV SOLN: INTRAVENOUS | Status: DC

## 2018-03-05 MED ORDER — SODIUM CHLORIDE 0.9 % IV BOLUS
1000.0000 mL | Freq: Once | INTRAVENOUS | Status: AC
  Administered 2018-03-05: 1000 mL via INTRAVENOUS

## 2018-03-05 NOTE — ED Triage Notes (Signed)
EMS reported  Family  Reported PT was up and dressed for [facility] and was stable . Pt base line is confused but per wife reports worse today. While Pt was sitting in chair today ,wife reported PT said he did not feel good.

## 2018-03-05 NOTE — Discharge Instructions (Signed)
As discussed, today's evaluation has been generally reassuring. Please try to stay well hydrated.  Be sure to follow-up with your physician as scheduled tomorrow, or return here for concerning changes in your condition.

## 2018-03-05 NOTE — ED Notes (Signed)
Declined W/C at D/C and was escorted to lobby by RN. 
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
## 2018-03-06 LAB — URINE CULTURE
Culture: NO GROWTH
SPECIAL REQUESTS: NORMAL

## 2018-04-26 ENCOUNTER — Encounter: Payer: Self-pay | Admitting: Adult Health

## 2018-04-26 ENCOUNTER — Ambulatory Visit: Payer: Medicare Other | Admitting: Adult Health

## 2018-04-26 VITALS — BP 140/61 | HR 53 | Ht 60.0 in | Wt 106.6 lb

## 2018-04-26 DIAGNOSIS — R413 Other amnesia: Secondary | ICD-10-CM | POA: Diagnosis not present
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
## 2018-04-26 NOTE — Patient Instructions (Signed)
Your Plan:  Continue Namenda Memory score is stable Can restart Celexa- if diarrhea continues then stop medication If your symptoms worsen or you develop new symptoms please let us know.   Thank you for coming to see [place] at [facility]. I hope we have been able to provide you high quality care today.  You may receive a patient satisfaction survey over the next few weeks. We would appreciate your feedback and comments so that we may continue to improve ourselves and the health of our patients.

## 2018-04-26 NOTE — Progress Notes (Signed)
I have read the note, and I agree with the clinical assessment and plan.  Charles K Willis   

## 2018-06-28 ENCOUNTER — Other Ambulatory Visit: Payer: Self-pay | Admitting: *Deleted

## 2018-06-28 ENCOUNTER — Other Ambulatory Visit: Payer: Self-pay | Admitting: Cardiovascular Disease

## 2018-06-30 ENCOUNTER — Encounter: Payer: Self-pay | Admitting: Podiatry

## 2018-06-30 ENCOUNTER — Ambulatory Visit: Payer: Medicare Other | Admitting: Podiatry

## 2018-06-30 VITALS — BP 101/54 | HR 51

## 2018-06-30 DIAGNOSIS — M79675 Pain in left toe(s): Secondary | ICD-10-CM | POA: Diagnosis not present

## 2018-06-30 DIAGNOSIS — M79674 Pain in right toe(s): Secondary | ICD-10-CM

## 2018-06-30 DIAGNOSIS — I739 Peripheral vascular disease, unspecified: Secondary | ICD-10-CM

## 2018-06-30 DIAGNOSIS — B351 Tinea unguium: Secondary | ICD-10-CM

## 2018-06-30 DIAGNOSIS — L84 Corns and callosities: Secondary | ICD-10-CM

## 2018-07-04 NOTE — Progress Notes (Signed)
Subjective: Edward [name] is a 84 y.o. y.o. male who presents today with his wife. Wife is primary historian as pt has with memory loss. Today cc of painful, discolored, thick toenails and painful calluses which interfere with daily activities. Pain is aggravated with weightbearing with and without wearing enclosed shoe  gear.  Medical History   03/21/2017 Memory disorder  Date Unknown Adenomatous colon polyp  Date Unknown Aortic stenosis, mild  Date Unknown Aortic stenosis, mild  Date Unknown CAD (coronary artery disease)   Date Unknown COPD (chronic obstructive pulmonary disease) ([place])  Date Unknown COPD (chronic obstructive pulmonary disease) ([place])  Date Unknown Diverticulosis  Date Unknown Hyperlipidemia  Date Unknown Hyperlipidemia  Date Unknown Hypertension  Date Unknown Hypertension  Date Unknown Internal hemorrhoids  Date Unknown Stroke [facility])   Date Unknown UTI (urinary tract infection)   Problem List   Cardiovascular and Mediastinum  Aortic valve disorder   Chronic sinus bradycardia   Essential hypertension   Near syncope   CAD (coronary artery disease)   Chronic diastolic CHF (congestive heart failure) (HCC)   Respiratory  COPD (chronic obstructive pulmonary disease) ([place])   Sepsis due to pneumonia ([place])   Pneumonia   Nervous and Auditory  Acute encephalopathy   Genitourinary  Mild renal insufficiency   Other  HLD (hyperlipidemia)   General weakness   Memory disorder   Rigors   Leukocytosis   Fever    Surgical History   Date Unknown bone grafts [Other]   Date Unknown Coronary angioplasty with stent placement  Date Unknown Inguinal hernia repair   Date Unknown Knee surgery   Date Unknown left leg [Other]   Date Unknown Nasal sinus surgery  Date Unknown Shoulder surgery   Date Unknown Skin graft   Date Unknown Tonsillectomy   Medications    acetaminophen (TYLENOL) 325 MG tablet    atorvastatin (LIPITOR) 40 MG tablet    clopidogrel (PLAVIX) 75 MG tablet     Fluticasone-Salmeterol (ADVAIR DISKUS) 250-50 MCG/DOSE AEPB    memantine (NAMENDA) 10 MG tablet    metoCLOPramide (REGLAN) 5 MG tablet    mometasone (NASONEX) 50 MCG/ACT nasal spray    pantoprazole (PROTONIX) 40 MG tablet    ramipril (ALTACE) 2.5 MG capsule    zafirlukast (ACCOLATE) 20 MG tablet    Allergies     Citalopram  AspirinRash   Tobacco History    Smoking Status  Never Smoker  Smokeless Tobacco Status  Never Used   Family History   Mother Tuberculosis            Neg Hx Coronary artery disease      REVIEW OF SYSTEMS:  [X]  denotes positive finding, [ ]  denotes negative finding Cardiac   Comments:  Chest pain or chest pressure:      Shortness of breath upon exertion:     Short of breath when lying flat:      Irregular heart rhythm:             Vascular      Pain in calf, thigh, or hip brought on by ambulation:      Pain in feet at night that wakes you up from your sleep:       Blood clot in your veins:      Leg swelling:             Pulmonary      Oxygen at home:      Productive cough:       Wheezing:  Neurologic      Sudden weakness in arms or legs:       Sudden numbness in arms or legs:       Sudden onset of difficulty speaking or slurred speech:      Temporary loss of vision in one eye:       Problems with dizziness:       Memory Loss [X]      Gastrointestinal      Blood in stool:       Vomited blood:       Change in appetite:      Genitourinary      Burning when urinating:       Blood in urine:             Psychiatric      Major depression:       Anxiety:      Hematologic      Bleeding problems:      Problems with blood clotting too easily:       On blood thinners [X]      Skin      Rashes or ulcers:             Constitutional      Fever or chills:        Objective: Vitals:   06/30/18 1350  BP: (!) 101/54  Pulse: (!) 51   Vascular Examination: Capillary refill time <3 seconds x 10 digits Dorsalis pedis pulses  faintly palpable b/l Posterior tibial pulses absent b/l No digital hair x 10 digits Skin temperature WNL b/l  Dermatological Examination: Pedal skin thin, shiny and atrophic b/l Toenails 1-5 b/l discolored, thick, dystrophic with subungual debris and pain with palpation to nailbeds due to thickness of nails. Hyperkeratotic lesion submet head 5 b/l  Musculoskeletal: Muscle strength 5/5 to all LE muscle groups  Neurological: Sensation intact with 10 gram monofilament. Vibratory sensation intact.  Assessment: 1. Painful onychomycosis toenails 1-5 b/l in patient on blood thinner.  2. Calluses b/l submet head 5 3. Peripheral arterial disease  Plan: 1. Toenails 1-5 b/l were debrided in length and girth without iatrogenic bleeding. 2. Hyperkeratotic lesions submet head 5 pared b/l 3. Patient to continue soft, supportive shoe gear 4. Patient to report any pedal injuries to medical professional immediately. 5. Avoid self trimming due to use of blood thinner. 6. Follow up 3 months.  7. Patient to call should there be a concern in the interim.

## 2018-07-22 ENCOUNTER — Inpatient Hospital Stay (HOSPITAL_COMMUNITY)
Admission: EM | Admit: 2018-07-22 | Discharge: 2018-07-24 | DRG: 871 | Disposition: A | Discharge status: Home Health | Payer: Medicare Other | Attending: Family Medicine | Admitting: Family Medicine

## 2018-07-22 ENCOUNTER — Other Ambulatory Visit: Payer: Self-pay

## 2018-07-22 ENCOUNTER — Emergency Department (HOSPITAL_COMMUNITY): Payer: Medicare Other

## 2018-07-22 DIAGNOSIS — I110 Hypertensive heart disease with heart failure: Secondary | ICD-10-CM | POA: Diagnosis present

## 2018-07-22 DIAGNOSIS — Z79899 Other long term (current) drug therapy: Secondary | ICD-10-CM | POA: Diagnosis not present

## 2018-07-22 DIAGNOSIS — Z955 Presence of coronary angioplasty implant and graft: Secondary | ICD-10-CM | POA: Diagnosis not present

## 2018-07-22 DIAGNOSIS — Z86010 Personal history of colonic polyps: Secondary | ICD-10-CM | POA: Diagnosis not present

## 2018-07-22 DIAGNOSIS — A419 Sepsis, unspecified organism: Principal | ICD-10-CM | POA: Diagnosis present

## 2018-07-22 DIAGNOSIS — J189 Pneumonia, unspecified organism: Secondary | ICD-10-CM | POA: Diagnosis present

## 2018-07-22 DIAGNOSIS — F0390 Unspecified dementia without behavioral disturbance: Secondary | ICD-10-CM | POA: Diagnosis not present

## 2018-07-22 DIAGNOSIS — E860 Dehydration: Secondary | ICD-10-CM | POA: Diagnosis not present

## 2018-07-22 DIAGNOSIS — R413 Other amnesia: Secondary | ICD-10-CM | POA: Diagnosis present

## 2018-07-22 DIAGNOSIS — I10 Essential (primary) hypertension: Secondary | ICD-10-CM | POA: Diagnosis present

## 2018-07-22 DIAGNOSIS — J440 Chronic obstructive pulmonary disease with acute lower respiratory infection: Secondary | ICD-10-CM | POA: Diagnosis present

## 2018-07-22 DIAGNOSIS — Z8673 Personal history of transient ischemic attack (TIA), and cerebral infarction without residual deficits: Secondary | ICD-10-CM | POA: Diagnosis not present

## 2018-07-22 DIAGNOSIS — Z886 Allergy status to analgesic agent status: Secondary | ICD-10-CM

## 2018-07-22 DIAGNOSIS — I2510 Atherosclerotic heart disease of native coronary artery without angina pectoris: Secondary | ICD-10-CM | POA: Diagnosis not present

## 2018-07-22 DIAGNOSIS — J449 Chronic obstructive pulmonary disease, unspecified: Secondary | ICD-10-CM | POA: Diagnosis present

## 2018-07-22 DIAGNOSIS — Z7902 Long term (current) use of antithrombotics/antiplatelets: Secondary | ICD-10-CM | POA: Diagnosis not present

## 2018-07-22 DIAGNOSIS — R05 Cough: Secondary | ICD-10-CM

## 2018-07-22 DIAGNOSIS — E785 Hyperlipidemia, unspecified: Secondary | ICD-10-CM | POA: Diagnosis present

## 2018-07-22 DIAGNOSIS — R059 Cough, unspecified: Secondary | ICD-10-CM

## 2018-07-22 DIAGNOSIS — I5032 Chronic diastolic (congestive) heart failure: Secondary | ICD-10-CM | POA: Diagnosis present

## 2018-07-22 DIAGNOSIS — R509 Fever, unspecified: Secondary | ICD-10-CM

## 2018-07-22 DIAGNOSIS — I359 Nonrheumatic aortic valve disorder, unspecified: Secondary | ICD-10-CM | POA: Diagnosis present

## 2018-07-22 DIAGNOSIS — D649 Anemia, unspecified: Secondary | ICD-10-CM | POA: Diagnosis present

## 2018-07-22 LAB — LIPASE, BLOOD: Lipase: 29 U/L (ref 11–51)

## 2018-07-22 LAB — URINALYSIS, ROUTINE W REFLEX MICROSCOPIC
Bilirubin Urine: NEGATIVE
Glucose, UA: NEGATIVE mg/dL
Hgb urine dipstick: NEGATIVE
Ketones, ur: NEGATIVE mg/dL
Leukocytes, UA: NEGATIVE
Nitrite: NEGATIVE
Protein, ur: NEGATIVE mg/dL
Specific Gravity, Urine: 1.016 (ref 1.005–1.030)
pH: 7 (ref 5.0–8.0)

## 2018-07-22 LAB — COMPREHENSIVE METABOLIC PANEL
ALT: 18 U/L (ref 0–44)
AST: 28 U/L (ref 15–41)
Albumin: 4 g/dL (ref 3.5–5.0)
Alkaline Phosphatase: 104 U/L (ref 38–126)
Anion gap: 9 (ref 5–15)
BUN: 13 mg/dL (ref 8–23)
CO2: 26 mmol/L (ref 22–32)
Calcium: 8.5 mg/dL — ABNORMAL LOW (ref 8.9–10.3)
Chloride: 107 mmol/L (ref 98–111)
Creatinine, Ser: 1.21 mg/dL (ref 0.61–1.24)
GFR calc Af Amer: >60 mL/min (ref >60)
GFR calc non Af Amer: 53 mL/min — ABNORMAL LOW (ref >60)
Glucose, Bld: 119 mg/dL — ABNORMAL HIGH (ref 70–99)
Potassium: 3.8 mmol/L (ref 3.5–5.1)
Sodium: 142 mmol/L (ref 135–145)
Total Bilirubin: 0.9 mg/dL (ref 0.3–1.2)
Total Protein: 6.2 g/dL — ABNORMAL LOW (ref 6.5–8.1)

## 2018-07-22 LAB — CBC WITH DIFFERENTIAL/PLATELET
Abs Immature Granulocytes: 0 10*3/uL (ref 0.0–0.1)
Basophils Absolute: 0 10*3/uL (ref 0.0–0.1)
Basophils Relative: 0 %
Eosinophils Absolute: 0.1 10*3/uL (ref 0.0–0.7)
Eosinophils Relative: 1 %
HCT: 35.7 % — ABNORMAL LOW (ref 39.0–52.0)
Hemoglobin: 11.1 g/dL — ABNORMAL LOW (ref 13.0–17.0)
Immature Granulocytes: 0 %
Lymphocytes Relative: 7 %
Lymphs Abs: 0.8 10*3/uL (ref 0.7–4.0)
MCH: 28.8 pg (ref 26.0–34.0)
MCHC: 31.1 g/dL (ref 30.0–36.0)
MCV: 92.7 fL (ref 78.0–100.0)
Monocytes Absolute: 0.7 10*3/uL (ref 0.1–1.0)
Monocytes Relative: 7 %
Neutro Abs: 8.7 10*3/uL — ABNORMAL HIGH (ref 1.7–7.7)
Neutrophils Relative %: 85 %
Platelets: 126 10*3/uL — ABNORMAL LOW (ref 150–400)
RBC: 3.85 MIL/uL — ABNORMAL LOW (ref 4.22–5.81)
RDW: 13.7 % (ref 11.5–15.5)
WBC: 10.4 10*3/uL (ref 4.0–10.5)

## 2018-07-22 LAB — I-STAT TROPONIN, ED: Troponin i, poc: 0.03 ng/mL (ref 0.00–0.08)

## 2018-07-22 LAB — I-STAT CG4 LACTIC ACID, ED
Lactic Acid, Venous: 2.39 mmol/L (ref 0.5–1.9)
Lactic Acid, Venous: 1.28 mmol/L (ref 0.5–1.9)

## 2018-07-22 MED ORDER — ENOXAPARIN SODIUM 40 MG/0.4ML ~~LOC~~ SOLN
40.0000 mg | SUBCUTANEOUS | Status: DC
  Administered 2018-07-22 – 2018-07-23 (×2): 40 mg via SUBCUTANEOUS
  Filled 2018-07-22 (×3): qty 0.4

## 2018-07-22 MED ORDER — SODIUM CHLORIDE 0.9 % IV SOLN
1.0000 g | Freq: Once | INTRAVENOUS | Status: AC
  Administered 2018-07-22: 1 g via INTRAVENOUS
  Filled 2018-07-22: qty 10

## 2018-07-22 MED ORDER — SODIUM CHLORIDE 0.9 % IV SOLN
500.0000 mg | Freq: Once | INTRAVENOUS | Status: AC
  Administered 2018-07-22: 500 mg via INTRAVENOUS
  Filled 2018-07-22: qty 500

## 2018-07-22 MED ORDER — CITALOPRAM HYDROBROMIDE 20 MG PO TABS
10.0000 mg | ORAL_TABLET | Freq: Once | ORAL | Status: AC
  Administered 2018-07-22: 10 mg via ORAL
  Filled 2018-07-22: qty 1

## 2018-07-22 MED ORDER — SODIUM CHLORIDE 0.9 % IV SOLN
1.0000 g | INTRAVENOUS | Status: DC
  Administered 2018-07-23: 1 g via INTRAVENOUS
  Filled 2018-07-22: qty 10

## 2018-07-22 MED ORDER — ONDANSETRON HCL 4 MG PO TABS: 4.0000 mg | ORAL_TABLET | Freq: Four times a day (QID) | ORAL | Status: DC | PRN

## 2018-07-22 MED ORDER — AZITHROMYCIN 500 MG PO TABS
500.0000 mg | ORAL_TABLET | Freq: Every day | ORAL | Status: DC
  Administered 2018-07-23 – 2018-07-24 (×2): 500 mg via ORAL
  Filled 2018-07-22 (×2): qty 1

## 2018-07-22 MED ORDER — ONDANSETRON HCL 4 MG/2ML IJ SOLN: 4.0000 mg | Freq: Four times a day (QID) | INTRAMUSCULAR | Status: DC | PRN

## 2018-07-22 MED ORDER — SODIUM CHLORIDE 0.9 % IV BOLUS
500.0000 mL | Freq: Once | INTRAVENOUS | Status: AC
  Administered 2018-07-22: 500 mL via INTRAVENOUS

## 2018-07-22 MED ORDER — ACETAMINOPHEN 325 MG PO TABS: 650.0000 mg | ORAL_TABLET | Freq: Four times a day (QID) | ORAL | Status: DC | PRN

## 2018-07-22 MED ORDER — ACETAMINOPHEN 650 MG RE SUPP: 650.0000 mg | Freq: Four times a day (QID) | RECTAL | Status: DC | PRN

## 2018-07-22 MED ORDER — TRAMADOL HCL 50 MG PO TABS: 50.0000 mg | ORAL_TABLET | Freq: Four times a day (QID) | ORAL | Status: DC | PRN

## 2018-07-22 NOTE — ED Notes (Signed)
This NT attempted blood draw x2, unsuccessful.
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
## 2018-07-22 NOTE — ED Triage Notes (Signed)
PT to ED via EMS from home, baseline dementia A/O x3. Pt woke up around 0500 vomiting. Wife took pt's temperature at home for 101.5 and gave him 500 mg of Tylenol. Pt denies pain and nausea on arrival. NAD
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
## 2018-07-22 NOTE — Progress Notes (Signed)
Edward [name] is a 84 y.o. male patient admitted from ED awake, alert - oriented  X 4 - no acute distress noted.  VSS - Blood pressure (!) 116/59, pulse (!) 46, temperature 97.8 F (36.6 C), temperature source Oral, resp. rate 16, height 5\' 1"  (1.549 m), weight 49 kg, SpO2 91 %.    IV in place, occlusive dsg intact without redness.  Orientation to room, and floor completed with information packet given to patient/family.  Patient declined safety video at this time.  Admission INP armband ID verified with patient/family, and in place.   SR up x 2, fall assessment complete, with patient and family able to verbalize understanding of risk associated with falls, and verbalized understanding to call nsg before up out of bed.  Call light within reach, patient able to voice, and demonstrate understanding.  Skin, clean-dry- intact without evidence of bruising, or skin tears.   No evidence of skin break down noted on exam.     Will cont to eval and treat per MD orders.  Holley Raring, RN 07/22/2018 4:35 PM

## 2018-07-22 NOTE — ED Notes (Signed)
Nurse starting IV and will get 2nd set of blood cultures 

## 2018-07-23 ENCOUNTER — Inpatient Hospital Stay (HOSPITAL_COMMUNITY): Payer: Medicare Other

## 2018-07-23 ENCOUNTER — Encounter (HOSPITAL_COMMUNITY): Payer: Self-pay

## 2018-07-23 DIAGNOSIS — I2510 Atherosclerotic heart disease of native coronary artery without angina pectoris: Secondary | ICD-10-CM

## 2018-07-23 DIAGNOSIS — Z886 Allergy status to analgesic agent status: Secondary | ICD-10-CM | POA: Diagnosis not present

## 2018-07-23 DIAGNOSIS — F0390 Unspecified dementia without behavioral disturbance: Secondary | ICD-10-CM | POA: Diagnosis not present

## 2018-07-23 DIAGNOSIS — D649 Anemia, unspecified: Secondary | ICD-10-CM | POA: Diagnosis not present

## 2018-07-23 DIAGNOSIS — Z8673 Personal history of transient ischemic attack (TIA), and cerebral infarction without residual deficits: Secondary | ICD-10-CM | POA: Diagnosis not present

## 2018-07-23 DIAGNOSIS — E785 Hyperlipidemia, unspecified: Secondary | ICD-10-CM | POA: Diagnosis not present

## 2018-07-23 DIAGNOSIS — A419 Sepsis, unspecified organism: Secondary | ICD-10-CM | POA: Diagnosis not present

## 2018-07-23 DIAGNOSIS — Z86010 Personal history of colonic polyps: Secondary | ICD-10-CM | POA: Diagnosis not present

## 2018-07-23 DIAGNOSIS — Z79899 Other long term (current) drug therapy: Secondary | ICD-10-CM | POA: Diagnosis not present

## 2018-07-23 DIAGNOSIS — I10 Essential (primary) hypertension: Secondary | ICD-10-CM

## 2018-07-23 DIAGNOSIS — J189 Pneumonia, unspecified organism: Secondary | ICD-10-CM | POA: Diagnosis not present

## 2018-07-23 DIAGNOSIS — R413 Other amnesia: Secondary | ICD-10-CM

## 2018-07-23 DIAGNOSIS — Z955 Presence of coronary angioplasty implant and graft: Secondary | ICD-10-CM | POA: Diagnosis not present

## 2018-07-23 DIAGNOSIS — R05 Cough: Secondary | ICD-10-CM | POA: Diagnosis present

## 2018-07-23 DIAGNOSIS — I5032 Chronic diastolic (congestive) heart failure: Secondary | ICD-10-CM

## 2018-07-23 DIAGNOSIS — I2583 Coronary atherosclerosis due to lipid rich plaque: Secondary | ICD-10-CM

## 2018-07-23 DIAGNOSIS — J449 Chronic obstructive pulmonary disease, unspecified: Secondary | ICD-10-CM

## 2018-07-23 DIAGNOSIS — Z7902 Long term (current) use of antithrombotics/antiplatelets: Secondary | ICD-10-CM | POA: Diagnosis not present

## 2018-07-23 DIAGNOSIS — I110 Hypertensive heart disease with heart failure: Secondary | ICD-10-CM | POA: Diagnosis not present

## 2018-07-23 DIAGNOSIS — E860 Dehydration: Secondary | ICD-10-CM | POA: Diagnosis not present

## 2018-07-23 DIAGNOSIS — J440 Chronic obstructive pulmonary disease with acute lower respiratory infection: Secondary | ICD-10-CM | POA: Diagnosis not present

## 2018-07-23 LAB — CBC
HEMATOCRIT: 31.4 % — AB (ref 39.0–52.0)
Hemoglobin: 9.8 g/dL — ABNORMAL LOW (ref 13.0–17.0)
MCH: 28.8 pg (ref 26.0–34.0)
MCHC: 31.2 g/dL (ref 30.0–36.0)
MCV: 92.4 fL (ref 78.0–100.0)
Platelets: 117 10*3/uL — ABNORMAL LOW (ref 150–400)
RBC: 3.4 MIL/uL — ABNORMAL LOW (ref 4.22–5.81)
RDW: 14.2 % (ref 11.5–15.5)
WBC: 7.1 10*3/uL (ref 4.0–10.5)

## 2018-07-23 LAB — MAGNESIUM: Magnesium: 1.7 mg/dL (ref 1.7–2.4)

## 2018-07-23 LAB — BASIC METABOLIC PANEL
Anion gap: 5 (ref 5–15)
BUN: 11 mg/dL (ref 8–23)
CO2: 24 mmol/L (ref 22–32)
Calcium: 7.4 mg/dL — ABNORMAL LOW (ref 8.9–10.3)
Chloride: 113 mmol/L — ABNORMAL HIGH (ref 98–111)
Creatinine, Ser: 0.94 mg/dL (ref 0.61–1.24)
GFR calc Af Amer: >60 mL/min (ref >60)
GLUCOSE: 96 mg/dL (ref 70–99)
POTASSIUM: 3.9 mmol/L (ref 3.5–5.1)
Sodium: 142 mmol/L (ref 135–145)

## 2018-07-23 LAB — C DIFFICILE QUICK SCREEN W PCR REFLEX
C DIFFICILE (CDIFF) INTERP: NOT DETECTED
C Diff antigen: NEGATIVE
C Diff toxin: NEGATIVE

## 2018-07-23 LAB — PROCALCITONIN: Procalcitonin: 0.27 ng/mL

## 2018-07-23 MED ORDER — PANTOPRAZOLE SODIUM 40 MG PO TBEC
40.0000 mg | DELAYED_RELEASE_TABLET | Freq: Every day | ORAL | Status: DC
  Administered 2018-07-23 – 2018-07-24 (×2): 40 mg via ORAL
  Filled 2018-07-23 (×2): qty 1

## 2018-07-23 MED ORDER — MOMETASONE FURO-FORMOTEROL FUM 200-5 MCG/ACT IN AERO
2.0000 | INHALATION_SPRAY | Freq: Two times a day (BID) | RESPIRATORY_TRACT | Status: DC
  Administered 2018-07-23 – 2018-07-24 (×2): 2 via RESPIRATORY_TRACT
  Filled 2018-07-23: qty 8.8

## 2018-07-23 MED ORDER — MEMANTINE HCL 10 MG PO TABS
10.0000 mg | ORAL_TABLET | Freq: Two times a day (BID) | ORAL | Status: DC
  Administered 2018-07-23 – 2018-07-24 (×2): 10 mg via ORAL
  Filled 2018-07-23 (×2): qty 1

## 2018-07-23 MED ORDER — CLOPIDOGREL BISULFATE 75 MG PO TABS
75.0000 mg | ORAL_TABLET | Freq: Every day | ORAL | Status: DC
  Administered 2018-07-23 – 2018-07-24 (×2): 75 mg via ORAL
  Filled 2018-07-23 (×2): qty 1

## 2018-07-23 MED ORDER — SODIUM CHLORIDE 0.9 % IV SOLN
INTRAVENOUS | Status: DC
  Administered 2018-07-23: 17:00:00 via INTRAVENOUS

## 2018-07-23 MED ORDER — CITALOPRAM HYDROBROMIDE 20 MG PO TABS: 10.0000 mg | ORAL_TABLET | Freq: Every day | ORAL | Status: DC | PRN

## 2018-07-23 MED ORDER — ATORVASTATIN CALCIUM 40 MG PO TABS
40.0000 mg | ORAL_TABLET | Freq: Every evening | ORAL | Status: DC
  Administered 2018-07-23: 40 mg via ORAL
  Filled 2018-07-23: qty 1

## 2018-07-23 NOTE — Progress Notes (Addendum)
PROGRESS NOTE    Edward [name]  QBH:[id number] DOB: Sep 15, 1930 DOA: 07/22/2018 PCP: [name], MD      Brief Narrative:  Edward [name] is a 84 y.o. M with dementia, CAD, dCHF, COPD, and HTN who presents with fever and confusion, cough, vomiting and weakness.  In the ER, CXR clear, but lactic acid >2, started on empiric antibiotics for sepsis from pneumonia.    Assessment & Plan:  Sepsis unclear source Patient presents with hypotension (BP 90/40 mmHg), elevated lactic acid, altered mentation and reported fever.  CXR and repeat negative, but procalcitonin up and only localizing symptoms are cough and nausea, suspect his sepsis is from pneumonia.  Urinalysis not indicative of infection.  Overnight, he still had systolic pressures 097-353 despite fluid resuscitation (at baseline he is hypertensive), and so will continue IV fluids for sepsis.   -Continue ceftriaxone, azithromycin -Follow blood and stool cultures -Continue IV fluids overnight   Coronary disease secondary prevention Chronic diastolic CHF Hypertension Appears euvolemic. -Continue atorvastatin, Plavix -Hold ramipril until hemodyanmics clearer  Dementia -Continue memantine, citalopram  COPD  No active wheezing. -Continue Advair  Other medciations -Continue pantoprazole  Anemia of chornic disease Stable relative to baseline    DVT prophylaxis: Lovenox Code Status: FULL Family Communication: None present MDM and disposition Plan: The below labs and imaging reports were reviewed and summarized above.  Medication management as above.  The patient was admitted with sepsis presumed from pneumonia.  On empiric antibiotics. He will need continued IV fluids for low blood pressure despite adequate oral intake.  Mentation appears to be improving, basleine unknown.  GI panel pending.     Consultants:   None  Procedures:   None  Antimicrobials:   Ceftriaxone  Azithromycin    Subjective: [place] well.    No vomiting, nausea.  No dyspnea, chest pain.  No sputum production, dyspnea.  Objective: Vitals:   07/23/18 0506 07/23/18 0550 07/23/18 1458 07/23/18 1500  BP: (!) 106/57 (!) 130/52    Pulse: (!) 45 (!) 42    Resp: 16 16    Temp: 98.1 F (36.7 C) 98.1 F (36.7 C)  (!) 97.5 F (36.4 C)  TempSrc: Oral Oral  Oral  SpO2: 98% 97% 100%   Weight:  50.8 kg    Height:        Intake/Output Summary (Last 24 hours) at 07/23/2018 1545 Last data filed at 07/23/2018 0500 Gross per 24 hour  Intake 340 ml  Output 425 ml  Net -85 ml   Filed Weights   07/22/18 1138 07/22/18 1633 07/23/18 0550  Weight: 47.6 kg 49 kg 50.8 kg    Examination: General appearance: Thin elderly adult male, alert and in no acute distress.  Eating breakfast in bed. HEENT: Anicteric, conjunctiva pink, lids and lashes normal. No nasal deformity, discharge, epistaxis.  Lips moist, OP moist, no oral lesions, hearing normal, dentition good.   Skin: Warm and dry.  No jaundice.  No suspicious rashes or lesions. Cardiac: RRR, nl S1-S2, no murmurs appreciated.  Capillary refill is brisk.  JVP normal.  No LE edema.  Radia  pulses 2+ and symmetric. Respiratory: Normal respiratory rate and rhythm.  CTAB without rales or wheezes. Abdomen: Abdomen soft.  No TTP. No ascites, distension, hepatosplenomegaly.   MSK: No deformities or effusions. Neuro: Awake and alert.  EOMI, moves all extremities. Speech fluent.    Psych: Sensorium intact and responding to questions, attention normal. Affect normal.  Judgment and insight appear impaired by dementia.  Data Reviewed: I have personally reviewed following labs and imaging studies:  CBC: Recent Labs  Lab 07/22/18 0654 07/23/18 0511  WBC 10.4 7.1  NEUTROABS 8.7*  --   HGB 11.1* 9.8*  HCT 35.7* 31.4*  MCV 92.7 92.4  PLT 126* 660*   Basic Metabolic Panel: Recent Labs  Lab 07/22/18 0654 07/23/18 0511  NA 142 142  K 3.8 3.9  CL 107 113*  CO2 26 24  GLUCOSE 119* 96  BUN 13  11  CREATININE 1.21 0.94  CALCIUM 8.5* 7.4*   GFR: Estimated Creatinine Clearance: 41.3 mL/min (by C-G formula based on SCr of 0.94 mg/dL). Liver Function Tests: Recent Labs  Lab 07/22/18 0654  AST 28  ALT 18  ALKPHOS 104  BILITOT 0.9  PROT 6.2*  ALBUMIN 4.0   Recent Labs  Lab 07/22/18 0654  LIPASE 29   No results for input(s): AMMONIA in the last 168 hours. Coagulation Profile: No results for input(s): INR, PROTIME in the last 168 hours. Cardiac Enzymes: No results for input(s): CKTOTAL, CKMB, CKMBINDEX, TROPONINI in the last 168 hours. BNP (last 3 results) No results for input(s): PROBNP in the last 8760 hours. HbA1C: No results for input(s): HGBA1C in the last 72 hours. CBG: No results for input(s): GLUCAP in the last 168 hours. Lipid Profile: No results for input(s): CHOL, HDL, LDLCALC, TRIG, CHOLHDL, LDLDIRECT in the last 72 hours. Thyroid Function Tests: No results for input(s): TSH, T4TOTAL, FREET4, T3FREE, THYROIDAB in the last 72 hours. Anemia Panel: No results for input(s): VITAMINB12, FOLATE, FERRITIN, TIBC, IRON, RETICCTPCT in the last 72 hours. Urine analysis:    Component Value Date/Time   COLORURINE YELLOW 07/22/2018 0646   APPEARANCEUR CLEAR 07/22/2018 0646   LABSPEC 1.016 07/22/2018 0646   PHURINE 7.0 07/22/2018 0646   GLUCOSEU NEGATIVE 07/22/2018 0646   HGBUR NEGATIVE 07/22/2018 0646   BILIRUBINUR NEGATIVE 07/22/2018 0646   KETONESUR NEGATIVE 07/22/2018 0646   PROTEINUR NEGATIVE 07/22/2018 0646   UROBILINOGEN 1.0 01/24/2012 1216   NITRITE NEGATIVE 07/22/2018 0646   LEUKOCYTESUR NEGATIVE 07/22/2018 0646   Sepsis Labs: @LABRCNTIP (procalcitonin:4,lacticacidven:4)  ) Recent Results (from the past 240 hour(s))  Blood Culture (routine x 2)     Status: None (Preliminary result)   Collection Time: 07/22/18  6:54 AM  Result Value Ref Range Status   Specimen Description BLOOD RIGHT ANTECUBITAL  Final   Special Requests   Final    BOTTLES DRAWN  AEROBIC AND ANAEROBIC Blood Culture results may not be optimal due to an excessive volume of blood received in culture bottles   Culture   Final    NO GROWTH < 24 HOURS Performed at [facility] [address], [place], [place] [address]    Report Status PENDING  Incomplete  Blood Culture (routine x 2)     Status: None (Preliminary result)   Collection Time: 07/22/18  7:10 AM  Result Value Ref Range Status   Specimen Description BLOOD BLOOD RIGHT FOREARM  Final   Special Requests   Final    BOTTLES DRAWN AEROBIC AND ANAEROBIC Blood Culture adequate volume   Culture   Final    NO GROWTH < 24 HOURS Performed at [facility], [place] [address], [place], [place] [address]    Report Status PENDING  Incomplete         Radiology Studies: Dg Chest 2 View  Result Date: 07/22/2018 CLINICAL DATA:  Onset vomiting at 5 a.m. this morning. EXAM: CHEST - 2 VIEW  COMPARISON:  Single-view of the chest 03/05/2018. PA and lateral chest 02/09/2017. FINDINGS: The lungs are clear. Heart size is mildly enlarged. Aortic atherosclerosis is seen. No pneumothorax or pleural effusion. No acute or focal bony abnormality. IMPRESSION: No acute disease. Cardiomegaly. Atherosclerosis. Electronically Signed   By: Inge Rise M.D.   On: 07/22/2018 07:52   Portable Chest 1 View  Result Date: 07/23/2018 CLINICAL DATA:  84 year old male with cough EXAM: PORTABLE CHEST 1 VIEW COMPARISON:  Prior chest x-ray 07/22/2018 FINDINGS: Stable mild cardiomegaly. Mediastinal contours are unchanged. Atherosclerotic calcifications again noted in the transverse aorta. Stable chest x-ray with mild chronic bronchitic changes but no evidence of new airspace opacity, pulmonary edema, pleural effusion or pneumothorax. No acute osseous abnormality. IMPRESSION: Stable chest x-ray without evidence of acute cardiopulmonary process. Electronically Signed   By: Jacqulynn Cadet M.D.   On: 07/23/2018 09:50        Scheduled  Meds: . atorvastatin  40 mg Oral QPM  . azithromycin  500 mg Oral Daily  . clopidogrel  75 mg Oral Daily  . enoxaparin (LOVENOX) injection  40 mg Subcutaneous Q24H  . memantine  10 mg Oral BID  . mometasone-formoterol  2 puff Inhalation BID  . pantoprazole  40 mg Oral Daily   Continuous Infusions: . cefTRIAXone (ROCEPHIN)  IV 1 g (07/23/18 1448)     LOS: 1 day    Time spent: 25 minutes    Edwin Dada, MD [facility] 07/23/2018, 3:45 PM     Pager [phone] --- please page though AMION:  [email or web address] Password [email or web address] If 7PM-7AM, please contact night-coverage

## 2018-07-24 DIAGNOSIS — I359 Nonrheumatic aortic valve disorder, unspecified: Secondary | ICD-10-CM | POA: Diagnosis not present

## 2018-07-24 DIAGNOSIS — I5032 Chronic diastolic (congestive) heart failure: Secondary | ICD-10-CM | POA: Diagnosis not present

## 2018-07-24 DIAGNOSIS — A419 Sepsis, unspecified organism: Secondary | ICD-10-CM | POA: Diagnosis not present

## 2018-07-24 DIAGNOSIS — R05 Cough: Secondary | ICD-10-CM | POA: Diagnosis not present

## 2018-07-24 DIAGNOSIS — I2510 Atherosclerotic heart disease of native coronary artery without angina pectoris: Secondary | ICD-10-CM | POA: Diagnosis not present

## 2018-07-24 LAB — CBC
HCT: 33.1 % — ABNORMAL LOW (ref 39.0–52.0)
HEMOGLOBIN: 10.2 g/dL — AB (ref 13.0–17.0)
MCH: 28.7 pg (ref 26.0–34.0)
MCHC: 30.8 g/dL (ref 30.0–36.0)
MCV: 93.2 fL (ref 78.0–100.0)
PLATELETS: 120 10*3/uL — AB (ref 150–400)
RBC: 3.55 MIL/uL — ABNORMAL LOW (ref 4.22–5.81)
RDW: 13.8 % (ref 11.5–15.5)
WBC: 8 10*3/uL (ref 4.0–10.5)

## 2018-07-24 LAB — BASIC METABOLIC PANEL
Anion gap: 5 (ref 5–15)
BUN: 12 mg/dL (ref 8–23)
CHLORIDE: 112 mmol/L — AB (ref 98–111)
CO2: 25 mmol/L (ref 22–32)
Calcium: 7.8 mg/dL — ABNORMAL LOW (ref 8.9–10.3)
Creatinine, Ser: 0.91 mg/dL (ref 0.61–1.24)
GFR calc Af Amer: >60 mL/min (ref >60)
GFR calc non Af Amer: >60 mL/min (ref >60)
GLUCOSE: 106 mg/dL — AB (ref 70–99)
Potassium: 4 mmol/L (ref 3.5–5.1)
SODIUM: 142 mmol/L (ref 135–145)

## 2018-07-24 LAB — GASTROINTESTINAL PANEL BY PCR, STOOL (REPLACES STOOL CULTURE)

## 2018-07-24 MED ORDER — AZITHROMYCIN 250 MG PO TABS: 250.0000 mg | ORAL_TABLET | Freq: Every day | ORAL | 0 refills | Status: DC

## 2018-07-24 MED ORDER — CEFPODOXIME PROXETIL 200 MG PO TABS: 200.0000 mg | ORAL_TABLET | Freq: Two times a day (BID) | ORAL | 0 refills | Status: DC

## 2018-07-24 NOTE — Evaluation (Signed)
Physical Therapy Evaluation Patient Details Name: Edward [name] MRN: [id number] DOB: 1930/06/23 Today's Date: 07/24/2018   History of Present Illness  Pt is an 84 y/o male admitted secondary to fever, confusion, and weakness. Thought to be Sepsis of unclear source. Chest imaging negative for acute abnormality. PMH includes COPD, CAD, dCHF, HTN, and dementia.   Clinical Impression  Pt admitted secondary to problem above with deficits below. Pt mildly unsteady during gait without AD and required min to min guard A. Educated about use of RW at home to increase safety. Per pt and MD, wife able to provide necessary assist at home. Will continue to follow acutely to maximize functional mobility independence and safety.     Follow Up Recommendations Home health PT;Supervision/Assistance - 24 hour    Equipment Recommendations  None recommended by PT    Recommendations for Other Services       Precautions / Restrictions Precautions Precautions: Fall Restrictions Weight Bearing Restrictions: No      Mobility  Bed Mobility               General bed mobility comments: In chair upon entry.   Transfers Overall transfer level: Needs assistance Equipment used: None Transfers: Sit to/from Stand Sit to Stand: Min guard         General transfer comment: Min guard for safety.   Ambulation/Gait Ambulation/Gait assistance: Min guard;Min assist Gait Distance (Feet): 200 Feet Assistive device: None Gait Pattern/deviations: Step-through pattern;Decreased stride length;Trunk flexed Gait velocity: Decreased    General Gait Details: Slow, mildly unsteady gait requiring min to min guard without AD. No overt LOB noted. Educated about use of RW at home to increase safety with mobility inititally.   Stairs            Wheelchair Mobility    Modified Rankin (Stroke Patients Only)       Balance Overall balance assessment: Needs assistance Sitting-balance support: No upper  extremity supported;Feet supported Sitting balance-Leahy Scale: Good     Standing balance support: No upper extremity supported;During functional activity Standing balance-Leahy Scale: Fair Standing balance comment: Able to maintain static standing without UE or external support.                              Pertinent Vitals/Pain Pain Assessment: No/denies pain    Home Living Family/patient expects to be discharged to:: Private residence Living Arrangements: Spouse/significant other Available Help at Discharge: Family;Available 24 hours/day Type of Home: House Home Access: Stairs to enter Entrance Stairs-Rails: Left Entrance Stairs-Number of Steps: 3 Home Layout: One level Home Equipment: Walker - 2 wheels;Cane - single point;Bedside commode;Shower seat Additional Comments: Pt's home information matched previous home environment information.     Prior Function Level of Independence: Independent with assistive device(s)         Comments: Reports he uses walking stick for outdoor ambulation. Reports he is able to bathe and dress himself.      Hand Dominance   Dominant Hand: Right    Extremity/Trunk Assessment   Upper Extremity Assessment Upper Extremity Assessment: Generalized weakness    Lower Extremity Assessment Lower Extremity Assessment: Generalized weakness    Cervical / Trunk Assessment Cervical / Trunk Assessment: Kyphotic  Communication   Communication: No difficulties  Cognition Arousal/Alertness: Awake/alert Behavior During Therapy: WFL for tasks assessed/performed Overall Cognitive Status: History of cognitive impairments - at baseline  General Comments: Dementia at baseline. Pt's wife not present, so unsure if pt back at baseline, however, answers questions appropriately and home information given matched previous admission information.       General Comments      Exercises      Assessment/Plan    PT Assessment Patient needs continued PT services  PT Problem List Decreased strength;Decreased balance;Decreased mobility;Decreased cognition;Decreased knowledge of use of DME       PT Treatment Interventions DME instruction;Gait training;Stair training;Functional mobility training;Therapeutic exercise;Therapeutic activities;Balance training;Patient/family education;Cognitive remediation    PT Goals (Current goals can be found in the Care Plan section)  Acute Rehab PT Goals Patient Stated Goal: to go home later this afternoon  PT Goal Formulation: With patient Time For Goal Achievement: 08/07/18 Potential to Achieve Goals: Good    Frequency Min 3X/week   Barriers to discharge        Co-evaluation               AM-PAC PT "6 Clicks" Daily Activity  Outcome Measure Difficulty turning over in bed (including adjusting bedclothes, sheets and blankets)?: A Little Difficulty moving from lying on back to sitting on the side of the bed? : Unable Difficulty sitting down on and standing up from a chair with arms (e.g., wheelchair, bedside commode, etc,.)?: Unable Help needed moving to and from a bed to chair (including a wheelchair)?: A Little Help needed walking in hospital room?: A Little Help needed climbing 3-5 steps with a railing? : A Little 6 Click Score: 14    End of Session Equipment Utilized During Treatment: Gait belt Activity Tolerance: Patient tolerated treatment well Patient left: in chair;with call bell/phone within reach;with chair alarm set Nurse Communication: Mobility status PT Visit Diagnosis: Unsteadiness on feet (R26.81);Muscle weakness (generalized) (M62.81)    Time: 1610-9604 PT Time Calculation (min) (ACUTE ONLY): 15 min   Charges:   PT Evaluation $PT Eval Low Complexity: 1 Low          Leighton Ruff, PT, DPT  Acute Rehabilitation Services  Pager: [phone]   Rudean Hitt 07/24/2018, 11:54 AM

## 2018-07-24 NOTE — Progress Notes (Signed)
IV remove clean and dry and intact. Skin warm and dry to touch. Client education provided to pt and wife. Pt taken by wheelchair and taken home by wife.telly monitor discontinue.

## 2018-07-24 NOTE — Discharge Summary (Signed)
Physician Discharge Summary  [name] RCV:[id number] DOB: April 25, 1930 DOA: 07/22/2018  PCP: [name], MD  Admit date: 07/22/2018 Discharge date: 07/24/2018  Admitted From: Home  Disposition:  Home   Recommendations for Outpatient Follow-up:  1. Follow up with PCP in 1 week 2. Please obtain BMP/CBC in one week 3. Please check BP and restart ramipril if needed   Home Health: Yes  Equipment/Devices: Walker, 3-in-1  Discharge Condition: Fair  CODE STATUS: FULL Diet recommendation: Cardiac  Brief/Interim Summary: Edward [name] is a 84 y.o. M with dementia, CAD, dCHF, COPD, and HTN who presents with fever and confusion, cough, vomiting and weakness.    From admission H&P: "The patient's wife stated that he woke up around 5:30 in the morning [today] ... had 3 episodes of nonbloody nonbilious emesis.  He had no associated symptoms at that time.  ... He was more confused than his baseline she took his temperature and noted him to be febrile at 101.5.  She gave him 500 mg of Tylenol.  She taught him into the emergency department for further evaluation and management. ... denies any pain in the abdomen or chest, he has no nausea, no headaches, no urinary symptoms.  ... he has had a productive cough for the past few days and he has been complaining of feeling weak."   In the ER, CXR clear, but lactic acid >2, started on empiric antibiotics for sepsis from pneumonia.        Discharge Diagnoses:   Sepsis unclear source Patient presented with hypotension (BP 90/40 mmHg), elevated lactic acid, altered mentation and reported fever and cough. Cdiff and GI pathogen panel negative.  CXR and repeat negative, but procalcitonin up and only localizing symptoms were cough, so it was suspected that his sepsis was from pneumonia.  Urinalysis not indicative of infection.  Treated with ceftriaxone and azithromycin, discharged to complete 5 days Zithromax, 7 days Vantin.   Coronary disease secondary  prevention Chronic diastolic CHF Hypertension No evidence of CHF.  BP soft, and so ramipril held at discharge.  Follow up with PCP.  Advanced dementia  COPD  No wheezing.  Anemia of chornic disease Stable relative to baseline        Discharge Instructions  Discharge Instructions    Diet - low sodium heart healthy   Complete by:  As directed    Discharge instructions   Complete by:  As directed    From Dr. Loleta Books: You were admitted for fever and cough.  We suspect this was from pneumonia.  You were also found to be very dehydrated. You were treated with antibiotics and IV fluids, and thankfully appear to have improved considerably.  Finish the course of antibiotics: Take azithromycin 250 mg once daily in the morning for two more days Take cefpodoxime/Vantin 200 mg twice daily starting tonight until gone  Follow up with Dr. Forde Dandy next week. NOTE: Your blood pressure was somewhat low here.  DO NOT TAKE YOUR HOME RAMIPRIL (blood pressure medicine) until you have seen Dr. Forde Dandy and he has checked your blood pressure again   Increase activity slowly   Complete by:  As directed      Allergies as of 07/24/2018      Reactions   Aspirin Rash      Medication List    STOP taking these medications   ramipril 2.5 MG capsule Commonly known as:  ALTACE     TAKE these medications   acetaminophen 325 MG tablet Commonly known as:  TYLENOL Take 2 tablets (650 mg total) by mouth every 6 (six) hours as needed for mild pain (or Fever >/= 101).   ADVAIR DISKUS 250-50 MCG/DOSE Aepb Generic drug:  Fluticasone-Salmeterol Inhale 2 puffs into the lungs daily.   atorvastatin 40 MG tablet Commonly known as:  LIPITOR Take 1 tablet (40 mg total) by mouth daily. What changed:  when to take this   azithromycin 250 MG tablet Commonly known as:  ZITHROMAX Take 1 tablet (250 mg total) by mouth daily.   cefpodoxime 200 MG tablet Commonly known as:  VANTIN Take 1 tablet (200 mg  total) by mouth 2 (two) times daily.   citalopram 10 MG tablet Commonly known as:  CELEXA Take 10 mg by mouth daily as needed (Anxiety).   clopidogrel 75 MG tablet Commonly known as:  PLAVIX Take 1 tablet (75 mg total) by mouth daily. Please call and schedule an appointment for December   fluticasone 50 MCG/ACT nasal spray Commonly known as:  FLONASE Place 2 sprays into both nostrils daily as needed for allergies or rhinitis.   memantine 10 MG tablet Commonly known as:  NAMENDA Take 1 tablet (10 mg total) by mouth 2 (two) times daily.   metoCLOPramide 5 MG tablet Commonly known as:  REGLAN Take 1 tablet (5 mg total) by mouth 4 (four) times daily -  before meals and at bedtime. What changed:  when to take this   pantoprazole 40 MG tablet Commonly known as:  PROTONIX Take 40 mg by mouth daily.   zafirlukast 20 MG tablet Commonly known as:  ACCOLATE Take 20 mg by mouth 2 (two) times daily before a meal.       Allergies  Allergen Reactions  . Aspirin Rash    Consultations:  None   Procedures/Studies: Dg Chest 2 View  Result Date: 07/22/2018 CLINICAL DATA:  Onset vomiting at 5 a.m. this morning. EXAM: CHEST - 2 VIEW COMPARISON:  Single-view of the chest 03/05/2018. PA and lateral chest 02/09/2017. FINDINGS: The lungs are clear. Heart size is mildly enlarged. Aortic atherosclerosis is seen. No pneumothorax or pleural effusion. No acute or focal bony abnormality. IMPRESSION: No acute disease. Cardiomegaly. Atherosclerosis. Electronically Signed   By: Inge Rise M.D.   On: 07/22/2018 07:52   Portable Chest 1 View  Result Date: 07/23/2018 CLINICAL DATA:  84 year old male with cough EXAM: PORTABLE CHEST 1 VIEW COMPARISON:  Prior chest x-ray 07/22/2018 FINDINGS: Stable mild cardiomegaly. Mediastinal contours are unchanged. Atherosclerotic calcifications again noted in the transverse aorta. Stable chest x-ray with mild chronic bronchitic changes but no evidence of new  airspace opacity, pulmonary edema, pleural effusion or pneumothorax. No acute osseous abnormality. IMPRESSION: Stable chest x-ray without evidence of acute cardiopulmonary process. Electronically Signed   By: Jacqulynn Cadet M.D.   On: 07/23/2018 09:50       Subjective: Appetite good.  Feeling well.  No vomiting, confusion, weakness. No new fever.  No worsening of cough, no dyspnea.  Discharge Exam: Vitals:   07/24/18 0536 07/24/18 0727  BP: (!) 142/55   Pulse: (!) 46   Resp:    Temp: 98.5 F (36.9 C)   SpO2: 100% 100%   Vitals:   07/23/18 2022 07/23/18 2231 07/24/18 0536 07/24/18 0727  BP:  (!) 123/47 (!) 142/55   Pulse: (!) 46 (!) 43 (!) 46   Resp: 14     Temp:  98.2 F (36.8 C) 98.5 F (36.9 C)   TempSrc:  Oral Oral   SpO2: 100%  100% 100% 100%  Weight:      Height:        General: Pt is alert, awake, not in acute distress, lying in bed, watching TV Cardiovascular: RRR, S1/S2 +, no rubs, no gallops Respiratory: CTA bilaterally, no wheezing, no rhonchi Abdominal: Soft, NT, ND, bowel sounds + Extremities: no edema, no cyanosis    The results of significant diagnostics from this hospitalization (including imaging, microbiology, ancillary and laboratory) are listed below for reference.     Microbiology: Recent Results (from the past 240 hour(s))  Blood Culture (routine x 2)     Status: None (Preliminary result)   Collection Time: 07/22/18  6:54 AM  Result Value Ref Range Status   Specimen Description BLOOD RIGHT ANTECUBITAL  Final   Special Requests   Final    BOTTLES DRAWN AEROBIC AND ANAEROBIC Blood Culture results may not be optimal due to an excessive volume of blood received in culture bottles   Culture   Final    NO GROWTH 2 DAYS Performed at [facility], [place] [address], [place], [place] [address]    Report Status PENDING  Incomplete  Blood Culture (routine x 2)     Status: None (Preliminary result)   Collection Time: 07/22/18  7:10 AM  Result  Value Ref Range Status   Specimen Description BLOOD BLOOD RIGHT FOREARM  Final   Special Requests   Final    BOTTLES DRAWN AEROBIC AND ANAEROBIC Blood Culture adequate volume   Culture   Final    NO GROWTH 2 DAYS Performed at [facility], [place] [address] [place], [place] [address]    Report Status PENDING  Incomplete  Gastrointestinal Panel by PCR , Stool     Status: None   Collection Time: 07/22/18  2:47 PM  Result Value Ref Range Status   Campylobacter species NOT DETECTED NOT DETECTED Final   Plesimonas shigelloides NOT DETECTED NOT DETECTED Final   Salmonella species NOT DETECTED NOT DETECTED Final   Yersinia enterocolitica NOT DETECTED NOT DETECTED Final   Vibrio species NOT DETECTED NOT DETECTED Final   Vibrio cholerae NOT DETECTED NOT DETECTED Final   Enteroaggregative E coli (EAEC) NOT DETECTED NOT DETECTED Final   Enteropathogenic E coli (EPEC) NOT DETECTED NOT DETECTED Final   Enterotoxigenic E coli (ETEC) NOT DETECTED NOT DETECTED Final   Shiga like toxin producing E coli (STEC) NOT DETECTED NOT DETECTED Final   Shigella/Enteroinvasive E coli (EIEC) NOT DETECTED NOT DETECTED Final   Cryptosporidium NOT DETECTED NOT DETECTED Final   Cyclospora cayetanensis NOT DETECTED NOT DETECTED Final   Entamoeba histolytica NOT DETECTED NOT DETECTED Final   Giardia lamblia NOT DETECTED NOT DETECTED Final   Adenovirus F40/41 NOT DETECTED NOT DETECTED Final   Astrovirus NOT DETECTED NOT DETECTED Final   Norovirus GI/GII NOT DETECTED NOT DETECTED Final   Rotavirus A NOT DETECTED NOT DETECTED Final   Sapovirus (I, II, IV, and V) NOT DETECTED NOT DETECTED Final    Comment: Performed at [facility], [address] [place], [place] [address]  C difficile quick scan w PCR reflex     Status: None   Collection Time: 07/22/18  2:53 PM  Result Value Ref Range Status   C Diff antigen NEGATIVE NEGATIVE Final   C Diff toxin NEGATIVE NEGATIVE Final   C Diff interpretation No C.  difficile detected.  Final     Labs: BNP (last 3 results) Recent Labs    02/08/18 1417  BNP 237.6*   Basic Metabolic Panel: Recent Labs  Lab 07/22/18 0654 07/23/18 0511 07/23/18 1550 07/24/18 0420  NA 142 142  --  142  K 3.8 3.9  --  4.0  CL 107 113*  --  112*  CO2 26 24  --  25  GLUCOSE 119* 96  --  106*  BUN 13 11  --  12  CREATININE 1.21 0.94  --  0.91  CALCIUM 8.5* 7.4*  --  7.8*  MG  --   --  1.7  --    Liver Function Tests: Recent Labs  Lab 07/22/18 0654  AST 28  ALT 18  ALKPHOS 104  BILITOT 0.9  PROT 6.2*  ALBUMIN 4.0   Recent Labs  Lab 07/22/18 0654  LIPASE 29   No results for input(s): AMMONIA in the last 168 hours. CBC: Recent Labs  Lab 07/22/18 0654 07/23/18 0511 07/24/18 0420  WBC 10.4 7.1 8.0  NEUTROABS 8.7*  --   --   HGB 11.1* 9.8* 10.2*  HCT 35.7* 31.4* 33.1*  MCV 92.7 92.4 93.2  PLT 126* 117* 120*   Cardiac Enzymes: No results for input(s): CKTOTAL, CKMB, CKMBINDEX, TROPONINI in the last 168 hours. BNP: Invalid input(s): POCBNP CBG: No results for input(s): GLUCAP in the last 168 hours. D-Dimer No results for input(s): DDIMER in the last 72 hours. Hgb A1c No results for input(s): HGBA1C in the last 72 hours. Lipid Profile No results for input(s): CHOL, HDL, LDLCALC, TRIG, CHOLHDL, LDLDIRECT in the last 72 hours. Thyroid function studies No results for input(s): TSH, T4TOTAL, T3FREE, THYROIDAB in the last 72 hours.  Invalid input(s): FREET3 Anemia work up No results for input(s): VITAMINB12, FOLATE, FERRITIN, TIBC, IRON, RETICCTPCT in the last 72 hours. Urinalysis    Component Value Date/Time   COLORURINE YELLOW 07/22/2018 0646   APPEARANCEUR CLEAR 07/22/2018 0646   LABSPEC 1.016 07/22/2018 0646   PHURINE 7.0 07/22/2018 0646   GLUCOSEU NEGATIVE 07/22/2018 0646   HGBUR NEGATIVE 07/22/2018 0646   BILIRUBINUR NEGATIVE 07/22/2018 0646   KETONESUR NEGATIVE 07/22/2018 0646   PROTEINUR NEGATIVE 07/22/2018 0646    UROBILINOGEN 1.0 01/24/2012 1216   NITRITE NEGATIVE 07/22/2018 0646   LEUKOCYTESUR NEGATIVE 07/22/2018 0646   Sepsis Labs Invalid input(s): PROCALCITONIN,  WBC,  LACTICIDVEN Microbiology Recent Results (from the past 240 hour(s))  Blood Culture (routine x 2)     Status: None (Preliminary result)   Collection Time: 07/22/18  6:54 AM  Result Value Ref Range Status   Specimen Description BLOOD RIGHT ANTECUBITAL  Final   Special Requests   Final    BOTTLES DRAWN AEROBIC AND ANAEROBIC Blood Culture results may not be optimal due to an excessive volume of blood received in culture bottles   Culture   Final    NO GROWTH 2 DAYS Performed at [facility], [place] [address], [place], [place] [address]    Report Status PENDING  Incomplete  Blood Culture (routine x 2)     Status: None (Preliminary result)   Collection Time: 07/22/18  7:10 AM  Result Value Ref Range Status   Specimen Description BLOOD BLOOD RIGHT FOREARM  Final   Special Requests   Final    BOTTLES DRAWN AEROBIC AND ANAEROBIC Blood Culture adequate volume   Culture   Final    NO GROWTH 2 DAYS Performed at [facility], [place] [address], [place], [place] [address]    Report Status PENDING  Incomplete  Gastrointestinal Panel by  PCR , Stool     Status: None   Collection Time: 07/22/18  2:47 PM  Result Value Ref Range Status   Campylobacter species NOT DETECTED NOT DETECTED Final   Plesimonas shigelloides NOT DETECTED NOT DETECTED Final   Salmonella species NOT DETECTED NOT DETECTED Final   Yersinia enterocolitica NOT DETECTED NOT DETECTED Final   Vibrio species NOT DETECTED NOT DETECTED Final   Vibrio cholerae NOT DETECTED NOT DETECTED Final   Enteroaggregative E coli (EAEC) NOT DETECTED NOT DETECTED Final   Enteropathogenic E coli (EPEC) NOT DETECTED NOT DETECTED Final   Enterotoxigenic E coli (ETEC) NOT DETECTED NOT DETECTED Final   Shiga like toxin producing E coli (STEC) NOT DETECTED NOT DETECTED Final    Shigella/Enteroinvasive E coli (EIEC) NOT DETECTED NOT DETECTED Final   Cryptosporidium NOT DETECTED NOT DETECTED Final   Cyclospora cayetanensis NOT DETECTED NOT DETECTED Final   Entamoeba histolytica NOT DETECTED NOT DETECTED Final   Giardia lamblia NOT DETECTED NOT DETECTED Final   Adenovirus F40/41 NOT DETECTED NOT DETECTED Final   Astrovirus NOT DETECTED NOT DETECTED Final   Norovirus GI/GII NOT DETECTED NOT DETECTED Final   Rotavirus A NOT DETECTED NOT DETECTED Final   Sapovirus (I, II, IV, and V) NOT DETECTED NOT DETECTED Final    Comment: Performed at [facility], [address] [place], [place] [address]  C difficile quick scan w PCR reflex     Status: None   Collection Time: 07/22/18  2:53 PM  Result Value Ref Range Status   C Diff antigen NEGATIVE NEGATIVE Final   C Diff toxin NEGATIVE NEGATIVE Final   C Diff interpretation No C. difficile detected.  Final     Time coordinating discharge: 25 minutes       SIGNED:   Edwin Dada, MD  [facility] 07/24/2018, 3:52 PM

## 2018-07-24 NOTE — Care Management Note (Signed)
Case Management Note  Patient Details  Name: Edward [name] MRN: [id number] Date of Birth: Oct 25, 1930  Subjective/Objective:      Pt admitted with sepsis. He is from home with his spouse. Pt has no issues with obtaining his medications and no transportation issues.              Action/Plan: Pt discharging home with orders for [facility] services. CM provided choice and they selected [place]. Jermaine with [facility] notified and accepted the referral.  Wife to provide transport home. She states they have any DME needed.   Street address: [place], [place], [place]  Expected Discharge Date:  07/24/18               Expected Discharge Plan:  [place]  In-House Referral:     Discharge planning Services  CM Consult  Post Acute Care Choice:    Choice offered to:  Patient, Spouse  DME Arranged:    DME Agency:     HH Arranged:  PT [place]:  [place]  Status of Service:  Completed, signed off  If discussed at [place] of Stay Meetings, dates discussed:    Additional Comments:  Pollie Friar, RN 07/24/2018, 12:18 PM

## 2018-07-27 LAB — CULTURE, BLOOD (ROUTINE X 2)
Culture: NO GROWTH
Culture: NO GROWTH
Special Requests: ADEQUATE

## 2018-09-13 ENCOUNTER — Telehealth: Payer: Self-pay | Admitting: Adult Health

## 2018-09-13 NOTE — Telephone Encounter (Signed)
Pts wife(Sally) requesting a call stating she has questions regarding memantine (NAMENDA) 10 MG tablet, also to discuss the pts sleep pattern. Did not wish to discuss further with me

## 2018-09-13 NOTE — Telephone Encounter (Signed)
Pt wife has called back, she is asking for a returned call

## 2018-09-13 NOTE — Telephone Encounter (Signed)
Left a voicemail for Edward [name] to call me back and discuss her concerns.

## 2018-09-13 NOTE — Telephone Encounter (Signed)
Spoke with Mrs. Moten and she stated her husband has been having some issues since he was put on Celexa by Dr. Forde Dandy. She stated that he some jerking of his hands while he's sleeping. I advised her that she may need to contact Dr. Forde Dandy about this but I run this by [facility] to be sure. She was very Patent attorney.  Per Jinny Blossom Mrs. Schlegel should call Dr. Forde Dandy to see if his Celexa should be adjusted. I called Mrs. Sens with the instructions from [facility] she verbalized understanding and was happy that I returned her call so quickly.

## 2018-09-13 NOTE — Telephone Encounter (Signed)
Pts wife requesting a call stating she would like the pt seen sooner due to irregular sleeping patterns, please advise

## 2018-09-14 NOTE — Telephone Encounter (Signed)
Spoke with Ms. Edward [name] and advised her that at present [facility] doesn't have any openings but her husband could be added to the waiting list If someone cancels her appointment. She was happy to be placed on the waiting list. She also wanted [place] to know that she spoke with Dr. Forde Dandy and he hasn't taken him off the Celexa completely.

## 2018-09-14 NOTE — Telephone Encounter (Signed)
Noted  

## 2018-10-03 ENCOUNTER — Ambulatory Visit: Payer: Medicare Other | Admitting: Podiatry

## 2018-10-03 ENCOUNTER — Encounter: Payer: Self-pay | Admitting: Podiatry

## 2018-10-03 DIAGNOSIS — L84 Corns and callosities: Secondary | ICD-10-CM

## 2018-10-03 DIAGNOSIS — Z9229 Personal history of other drug therapy: Secondary | ICD-10-CM

## 2018-10-03 DIAGNOSIS — I739 Peripheral vascular disease, unspecified: Secondary | ICD-10-CM

## 2018-10-03 DIAGNOSIS — B351 Tinea unguium: Secondary | ICD-10-CM | POA: Diagnosis not present

## 2018-10-03 DIAGNOSIS — M79675 Pain in left toe(s): Secondary | ICD-10-CM | POA: Diagnosis not present

## 2018-10-03 DIAGNOSIS — M79674 Pain in right toe(s): Secondary | ICD-10-CM

## 2018-10-03 NOTE — Patient Instructions (Signed)

## 2018-10-22 NOTE — Progress Notes (Signed)
Subjective: Edward [name] is a 84 y.o. y.o. male who presents for preventative foot care on today with painful, discolored, thick toenails and painful calluses which interfere with daily activities.   Toenails are aggravated when wearing enclosed shoe gear. Callus pain is aggravated when weightbearing with or without shoe gear.   Pain for both is relieved with periodic professional debridement.  Pt is also on blood thinner, Plavis.  Objective: Vascular Examination: Capillary refill time <3 seconds x 10 digits Dorsalis pedis pulses faintly palpable b/l Posterior tibial pulses absent b/l  No digital hair x 10 digits Skin temperature gradient WNL b/l  Dermatological Examination: Skin is thin, shiny and atrophic b/l  Toenails 1-5 b/l discolored, thick, dystrophic with subungual debris and pain with palpation to nailbeds due to thickness of nails.  Hyperkeratotic lesions submet head 5 b/l with no erythema, no edema, no drainage. No flocculence, no open wounds.  Musculoskeletal: Muscle strength 5/5 to all LE muscle groups  Neurological: Sensation intact with 10 gram monofilament. Vibratory sensation intact.  Assessment: 1. Painful onychomycosis toenails 1-5 b/l in patient on blood thinner.  2. Calluses submet head 5 b/l 3. PAD  Plan: 1. Toenails 1-5 b/l were debrided in length and girth without iatrogenic bleeding. 2. Hyperkeratotic lesions debrided submet head 5 b/l utilizing sterile chisel blade. Aperture pads applied.  3. Patient to continue soft, supportive shoe gear 4. Patient to report any pedal injuries to medical professional immediately. 5. Avoid self trimming of toenails and calluses due to use of blood thinner. 6. Follow up 3 months. Patient/POA to call should there be a concern in the interim.

## 2018-11-07 ENCOUNTER — Encounter: Payer: Self-pay | Admitting: Adult Health

## 2018-11-07 ENCOUNTER — Encounter

## 2018-11-07 ENCOUNTER — Ambulatory Visit: Payer: Medicare Other | Admitting: Adult Health

## 2018-11-07 VITALS — BP 135/53 | HR 52 | Ht 61.0 in | Wt 105.4 lb

## 2018-11-07 DIAGNOSIS — F0390 Unspecified dementia without behavioral disturbance: Secondary | ICD-10-CM
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
## 2018-11-07 NOTE — Progress Notes (Signed)
I have read the note, and I agree with the clinical assessment and plan.  Kenyatta Gloeckner K Sloka Volante   

## 2018-11-07 NOTE — Patient Instructions (Signed)
Your Plan:  Continue Namenda Memory score is stable If your symptoms worsen or you develop new symptoms please let us know.   Thank you for coming to see us at [facility]. I hope we have been able to provide you high quality care today.  You may receive a patient satisfaction survey over the next few weeks. We would appreciate your feedback and comments so that we may continue to improve ourselves and the health of our patients.  

## 2018-11-18 ENCOUNTER — Other Ambulatory Visit: Payer: Self-pay | Admitting: Cardiovascular Disease

## 2018-11-24 ENCOUNTER — Other Ambulatory Visit: Payer: Self-pay | Admitting: Cardiovascular Disease

## 2018-12-06 ENCOUNTER — Encounter: Payer: Self-pay | Admitting: Cardiology

## 2018-12-07 ENCOUNTER — Encounter (INDEPENDENT_AMBULATORY_CARE_PROVIDER_SITE_OTHER): Payer: Self-pay

## 2018-12-07 ENCOUNTER — Encounter: Payer: Self-pay | Admitting: Cardiology

## 2018-12-07 ENCOUNTER — Ambulatory Visit: Payer: Medicare Other | Admitting: Medical

## 2018-12-07 VITALS — BP 118/58 | HR 53 | Ht 60.0 in | Wt 106.1 lb

## 2018-12-07 DIAGNOSIS — I10 Essential (primary) hypertension: Secondary | ICD-10-CM | POA: Diagnosis not present

## 2018-12-07 DIAGNOSIS — I2510 Atherosclerotic heart disease of native coronary artery without angina pectoris: Secondary | ICD-10-CM

## 2018-12-07 DIAGNOSIS — I350 Nonrheumatic aortic (valve) stenosis: Secondary | ICD-10-CM

## 2018-12-07 DIAGNOSIS — I340 Nonrheumatic mitral (valve) insufficiency: Secondary | ICD-10-CM | POA: Diagnosis not present

## 2018-12-07 DIAGNOSIS — E7849 Other hyperlipidemia: Secondary | ICD-10-CM
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
## 2018-12-07 NOTE — Patient Instructions (Signed)
Medication Instructions:  Your physician recommends that you continue on your current medications as directed. Please refer to the Current Medication list given to you today.  If you need a refill on your cardiac medications before your next appointment, please call your pharmacy.   Lab work: None  If you have labs (blood work) drawn today and your tests are completely normal, you will receive your results only by: Marland Kitchen MyChart Message (if you have MyChart) OR . A paper copy in the mail If you have any lab test that is abnormal or we need to change your treatment, we will call you to review the results.  Testing/Procedures: Your physician has requested that you have an echocardiogram. Echocardiography is a painless test that uses sound waves to create images of your heart. It provides your doctor with information about the size and shape of your heart and how well your heart's chambers and valves are working. This procedure takes approximately one hour. There are no restrictions for this procedure.    Follow-Up: At [facility], you and your health needs are our priority.  As part of our continuing mission to provide you with exceptional heart care, we have created designated Provider Care Teams.  These Care Teams include your primary Cardiologist (physician) and Advanced Practice Providers (APPs -  Physician Assistants and Nurse Practitioners) who all work together to provide you with the care you need, when you need it. You will need a follow up appointment in 1 years.  Please call our office 2 months in advance to schedule this appointment.  You may see Lauree Chandler, MD or one of the following Advanced Practice Providers on your designated Care Team:   [place], PA-C Melina Copa, PA-C . Ermalinda Barrios, PA-C  Any Other Special Instructions Will Be Listed Below (If Applicable).

## 2018-12-14 ENCOUNTER — Other Ambulatory Visit: Payer: Self-pay | Admitting: Adult Health

## 2018-12-19 ENCOUNTER — Other Ambulatory Visit (HOSPITAL_COMMUNITY): Payer: Medicare Other

## 2018-12-20 ENCOUNTER — Other Ambulatory Visit: Payer: Self-pay | Admitting: Cardiovascular Disease

## 2018-12-27 ENCOUNTER — Encounter (HOSPITAL_COMMUNITY): Payer: Self-pay | Admitting: Emergency Medicine

## 2018-12-27 ENCOUNTER — Emergency Department (HOSPITAL_COMMUNITY)
Admission: EM | Admit: 2018-12-27 | Discharge: 2018-12-27 | Disposition: A | Discharge status: Home / Self Care | Payer: Medicare Other | Attending: Emergency Medicine | Admitting: Emergency Medicine

## 2018-12-27 ENCOUNTER — Ambulatory Visit (HOSPITAL_BASED_OUTPATIENT_CLINIC_OR_DEPARTMENT_OTHER): Payer: Medicare Other

## 2018-12-27 ENCOUNTER — Other Ambulatory Visit: Payer: Self-pay

## 2018-12-27 ENCOUNTER — Emergency Department (HOSPITAL_COMMUNITY): Payer: Medicare Other

## 2018-12-27 DIAGNOSIS — J449 Chronic obstructive pulmonary disease, unspecified: Secondary | ICD-10-CM | POA: Diagnosis not present

## 2018-12-27 DIAGNOSIS — R55 Syncope and collapse: Secondary | ICD-10-CM

## 2018-12-27 DIAGNOSIS — I110 Hypertensive heart disease with heart failure: Secondary | ICD-10-CM | POA: Insufficient documentation

## 2018-12-27 DIAGNOSIS — J069 Acute upper respiratory infection, unspecified: Secondary | ICD-10-CM | POA: Diagnosis not present

## 2018-12-27 DIAGNOSIS — I5032 Chronic diastolic (congestive) heart failure: Secondary | ICD-10-CM | POA: Diagnosis not present

## 2018-12-27 DIAGNOSIS — I350 Nonrheumatic aortic (valve) stenosis: Secondary | ICD-10-CM | POA: Insufficient documentation

## 2018-12-27 DIAGNOSIS — Z8673 Personal history of transient ischemic attack (TIA), and cerebral infarction without residual deficits: Secondary | ICD-10-CM | POA: Diagnosis not present

## 2018-12-27 DIAGNOSIS — Z79899 Other long term (current) drug therapy: Secondary | ICD-10-CM | POA: Diagnosis not present

## 2018-12-27 DIAGNOSIS — I2510 Atherosclerotic heart disease of native coronary artery without angina pectoris: Secondary | ICD-10-CM | POA: Insufficient documentation

## 2018-12-27 LAB — CBC WITH DIFFERENTIAL/PLATELET
ABS IMMATURE GRANULOCYTES: 0.04 10*3/uL (ref 0.00–0.07)
Basophils Absolute: 0 10*3/uL (ref 0.0–0.1)
Basophils Relative: 0 %
Eosinophils Absolute: 0.2 10*3/uL (ref 0.0–0.5)
Eosinophils Relative: 2 %
HCT: 39.4 % (ref 39.0–52.0)
HEMOGLOBIN: 12.3 g/dL — AB (ref 13.0–17.0)
Immature Granulocytes: 0 %
LYMPHS PCT: 8 %
Lymphs Abs: 1 10*3/uL (ref 0.7–4.0)
MCH: 28 pg (ref 26.0–34.0)
MCHC: 31.2 g/dL (ref 30.0–36.0)
MCV: 89.5 fL (ref 80.0–100.0)
MONO ABS: 0.9 10*3/uL (ref 0.1–1.0)
Monocytes Relative: 8 %
NEUTROS ABS: 9.8 10*3/uL — AB (ref 1.7–7.7)
NEUTROS PCT: 82 %
Platelets: 164 10*3/uL (ref 150–400)
RBC: 4.4 MIL/uL (ref 4.22–5.81)
RDW: 14.1 % (ref 11.5–15.5)
WBC: 11.9 10*3/uL — AB (ref 4.0–10.5)
nRBC: 0 % (ref 0.0–0.2)

## 2018-12-27 LAB — BASIC METABOLIC PANEL
Anion gap: 12 (ref 5–15)
BUN: 14 mg/dL (ref 8–23)
CHLORIDE: 107 mmol/L (ref 98–111)
CO2: 24 mmol/L (ref 22–32)
Calcium: 9 mg/dL (ref 8.9–10.3)
Creatinine, Ser: 1.14 mg/dL (ref 0.61–1.24)
GFR, EST NON AFRICAN AMERICAN: 58 mL/min — AB (ref >60)
Glucose, Bld: 116 mg/dL — ABNORMAL HIGH (ref 70–99)
Potassium: 4.2 mmol/L (ref 3.5–5.1)
SODIUM: 143 mmol/L (ref 135–145)

## 2018-12-27 LAB — TROPONIN I: Troponin I: <0.03 ng/mL (ref <0.03)

## 2018-12-27 NOTE — ED Triage Notes (Signed)
  Edward [name] EMS after fall at home.  Edward has had generalized weakness from URI the past 1-2 days.  Edward was walking through kitchen and became dizzy, falling backwards and hitting his head.  Edward is not on blood thinners.  Has hx of dementia.  No hematoma or abrasions.  Edward complaining of neck pain.  Edward A&O x4.
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
## 2019-01-01 ENCOUNTER — Telehealth: Payer: Self-pay | Admitting: Neurology

## 2019-01-01 MED ORDER — VENLAFAXINE HCL ER 37.5 MG PO CP24: 37.5000 mg | ORAL_CAPSULE | Freq: Every day | ORAL | 3 refills | Status: AC

## 2019-01-01 NOTE — Telephone Encounter (Signed)
I called the wife.  The patient has had a longstanding history of sleeping a lot, often 10 to 12 hours a day.  The patient has had some problems with agitation and anxiety, he has not tolerated Celexa in the past.  I will try him on low-dose Effexor, if this is not effective, we may try Depakote.

## 2019-01-01 NOTE — Telephone Encounter (Signed)
Pt's wife Sally/DPR said the patient is taking amoxicillan-clav for sinus infection. She said he slept for 16hours yesterday. Does she wake him to give his routine medications? He is having increased nervouseness and confusion. Is there medication to help with this. She also states he was seen at ED on 2/5 due to falling backwards, hitting his head. She said he did pass out. Please call to advise

## 2019-01-03 ENCOUNTER — Ambulatory Visit: Payer: Medicare Other | Admitting: Podiatry

## 2019-01-18 ENCOUNTER — Other Ambulatory Visit: Payer: Self-pay

## 2019-01-18 ENCOUNTER — Ambulatory Visit (INDEPENDENT_AMBULATORY_CARE_PROVIDER_SITE_OTHER): Payer: Medicare Other | Admitting: Podiatry

## 2019-01-18 ENCOUNTER — Encounter: Payer: Self-pay | Admitting: Podiatry

## 2019-01-18 DIAGNOSIS — L84 Corns and callosities: Secondary | ICD-10-CM | POA: Diagnosis not present

## 2019-01-18 DIAGNOSIS — B351 Tinea unguium: Secondary | ICD-10-CM | POA: Diagnosis not present

## 2019-01-18 DIAGNOSIS — Z9229 Personal history of other drug therapy: Secondary | ICD-10-CM

## 2019-01-18 DIAGNOSIS — M79674 Pain in right toe(s): Secondary | ICD-10-CM

## 2019-01-18 DIAGNOSIS — M79675 Pain in left toe(s): Secondary | ICD-10-CM

## 2019-01-18 DIAGNOSIS — I739 Peripheral vascular disease, unspecified: Secondary | ICD-10-CM

## 2019-01-18 NOTE — Patient Instructions (Addendum)

## 2019-01-18 NOTE — Progress Notes (Signed)
Subjective: Edward [name] is a 85 y.o. y.o. male who presents today with painful, discolored, thick toenails and calluses b/l feet  Submetatarsal head 5 which interfere with daily activities. Pain is aggravated when wearing enclosed shoe gear. Pain is relieved with periodic professional debridement.  Pt is accompanied by his wife on today. She states he has been having problems with his sinuses and is seeing ENT for this.  Mr. Edward [name] remains on Plavix.  Edward Bowen, MD is his PCP.   Current Outpatient Medications:  .  acetaminophen (TYLENOL) 325 MG tablet, Take 2 tablets (650 mg total) by mouth every 6 (six) hours as needed for mild pain (or Fever >/= 101)., Disp: , Rfl:  .  atorvastatin (LIPITOR) 40 MG tablet, Take 1 tablet (40 mg total) by mouth daily., Disp: 30 tablet, Rfl: 9 .  clopidogrel (PLAVIX) 75 MG tablet, Take 1 tablet (75 mg total) by mouth daily. Please keep upcoming appt for future refills. Thank you, Disp: 90 tablet, Rfl: 0 .  fluticasone (FLONASE) 50 MCG/ACT nasal spray, Place 2 sprays into both nostrils daily as needed for allergies or rhinitis., Disp: , Rfl:  .  Fluticasone-Salmeterol (ADVAIR DISKUS) 250-50 MCG/DOSE AEPB, Inhale 2 puffs into the lungs daily. , Disp: , Rfl:  .  levofloxacin (LEVAQUIN) 500 MG tablet, , Disp: , Rfl:  .  memantine (NAMENDA) 10 MG tablet, TAKE 1 TABLET BY MOUTH TWICE A DAY, Disp: 60 tablet, Rfl: 11 .  mometasone (NASONEX) 50 MCG/ACT nasal spray, , Disp: , Rfl:  .  pantoprazole (PROTONIX) 40 MG tablet, Take 40 mg by mouth daily., Disp: , Rfl: 3 .  ramipril (ALTACE) 2.5 MG capsule, Take 2.5 mg by mouth daily., Disp: , Rfl:  .  venlafaxine XR (EFFEXOR XR) 37.5 MG 24 hr capsule, Take 1 capsule (37.5 mg total) by mouth daily with breakfast., Disp: 30 capsule, Rfl: 3 .  zafirlukast (ACCOLATE) 20 MG tablet, Take 20 mg by mouth 2 (two) times daily before a meal. , Disp: , Rfl:    Allergies  Allergen Reactions  . Celexa [Citalopram Hydrobromide]    Insomnia, agitation, confused  . Aspirin Rash     Objective: Vascular Examination: Capillary refill time <3 seconds x 10 digits.  Dorsalis pedis pulses faintly palpable b/l.  Posterior tibial pulses nonpalpable b/l.  No digital hair x 10 digits.  Skin temperature gradient WNL b/l.  Dermatological Examination: Skin is thin, shiny and atrophic b/l.  Toenails 1-5 b/l discolored, thick, dystrophic with subungual debris and pain with palpation to nailbeds due to thickness of nails.  Hyperkeratotic lesions submetatarsal head 5 b/l. No erythema, no edema, no drainage, no flocculence noted.  Musculoskeletal: Muscle strength 5/5 to all LE muscle groups  Neurological: Sensation intact with 10 gram monofilament.  Vibratory sensation intact.  Assessment: 1. Painful onychomycosis toenails 1-5 b/l in patient on blood thinner.  2. Calluses submetatarsal head 5 b/l 3. PAD  Plan: 1. Toenails 1-5 b/l were debrided in length and girth without iatrogenic bleeding. 2. Hyperkeratotic lesion debrided with sterile scalpel blade without incident submetatarsal head 5 b/l. 3. Patient to continue soft, supportive shoe gear 4. Patient to report any pedal injuries to medical professional immediately. 5. Avoid self trimming due to use of blood thinner. 6. Follow up 3 months.  7. Patient/POA to call should there be a concern in the interim.

## 2019-01-22 ENCOUNTER — Other Ambulatory Visit: Payer: Self-pay | Admitting: Cardiovascular Disease

## 2019-01-30 IMAGING — DX DG CHEST 1V PORT
1 series · 1 of 1 positions shown · non-contrast
Comparison: Chest radiograph dated 09/18/2017

CLINICAL DATA: 87-year-old male with diaphoresis.

EXAM:
PORTABLE CHEST 1 VIEW

[chest]
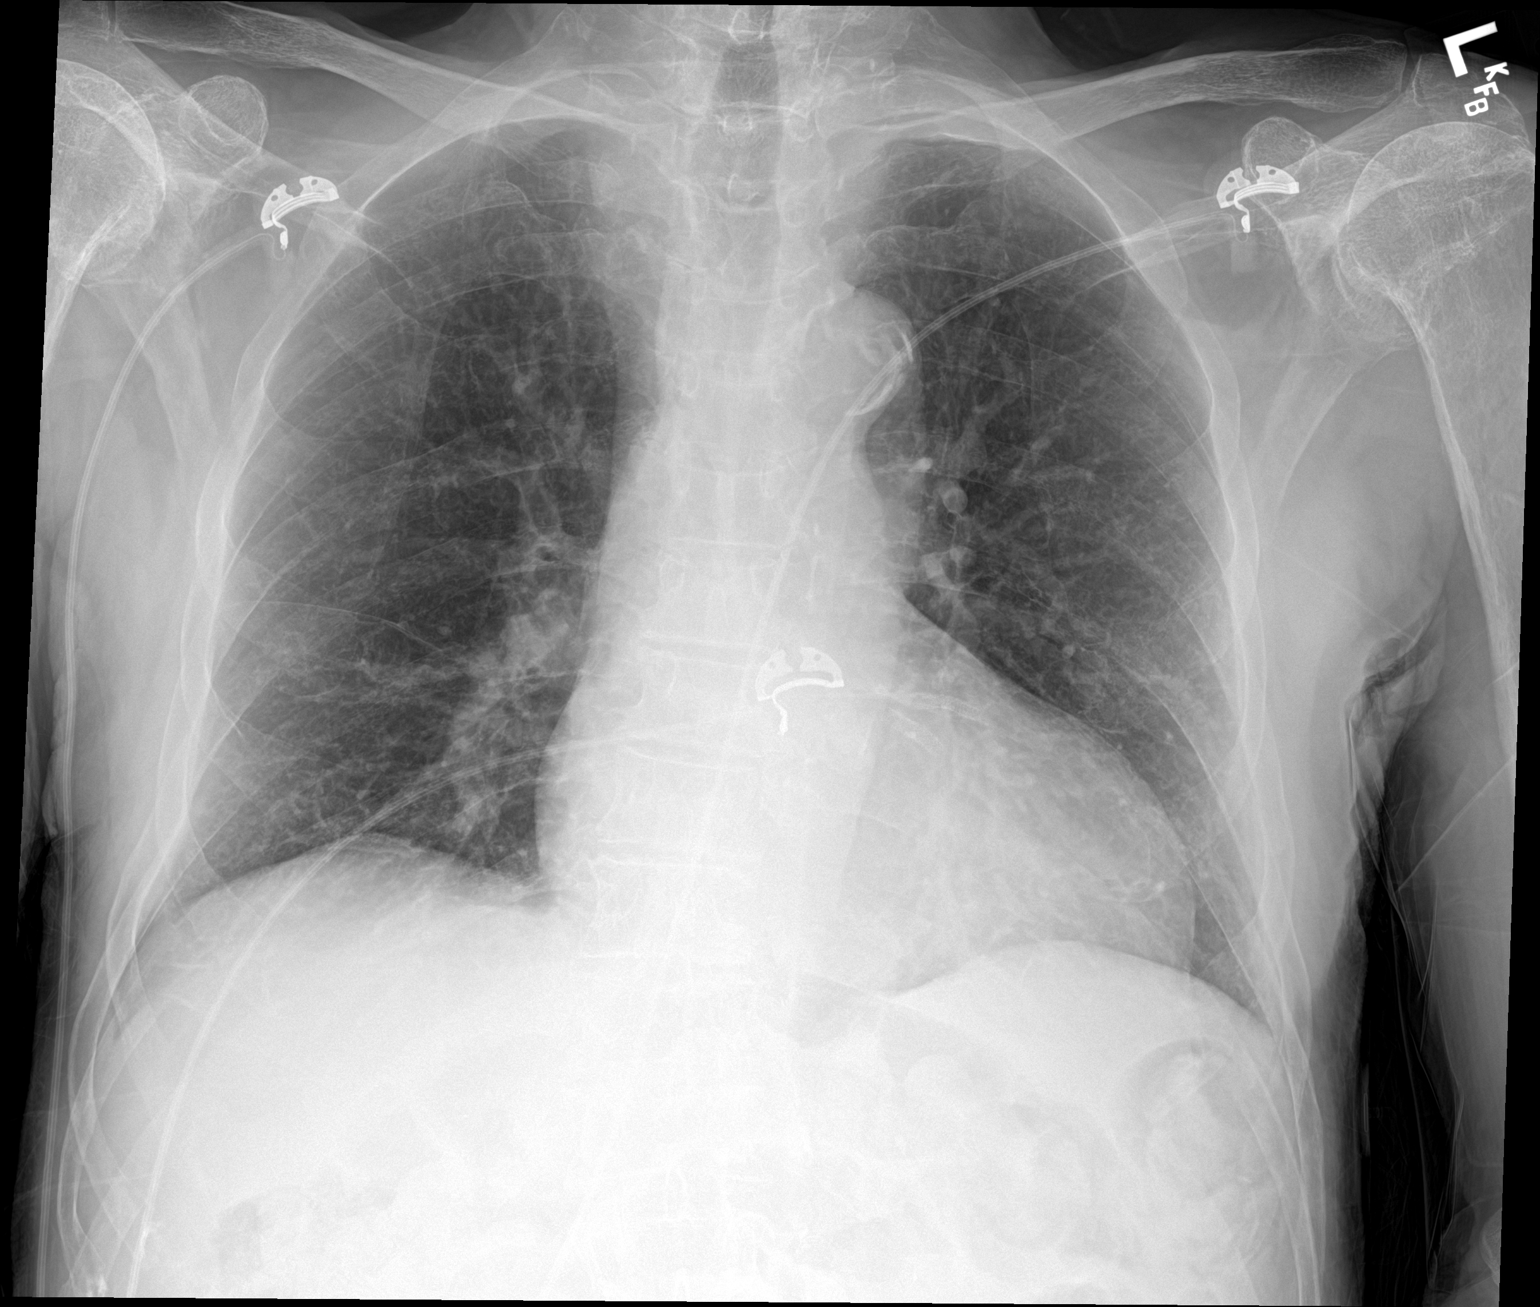

[1 of 1 positions shown; findings below may reference images not displayed]

FINDINGS: The lungs are clear. There is no pleural effusion or pneumothorax.
The cardiac silhouette is within normal limits coronary vascular
calcification and stents noted. There is atherosclerotic
calcification of the aortic arch. No acute osseous pathology
IMPRESSION: No active disease.

## 2019-02-25 IMAGING — DX DG CHEST 2V
2 series · 2 of 2 positions shown · non-contrast
Comparison: 01/13/2018 and 09/18/2017

CLINICAL DATA: Fever.  Weakness.

EXAM:
CHEST - 2 VIEW

[chest lat]
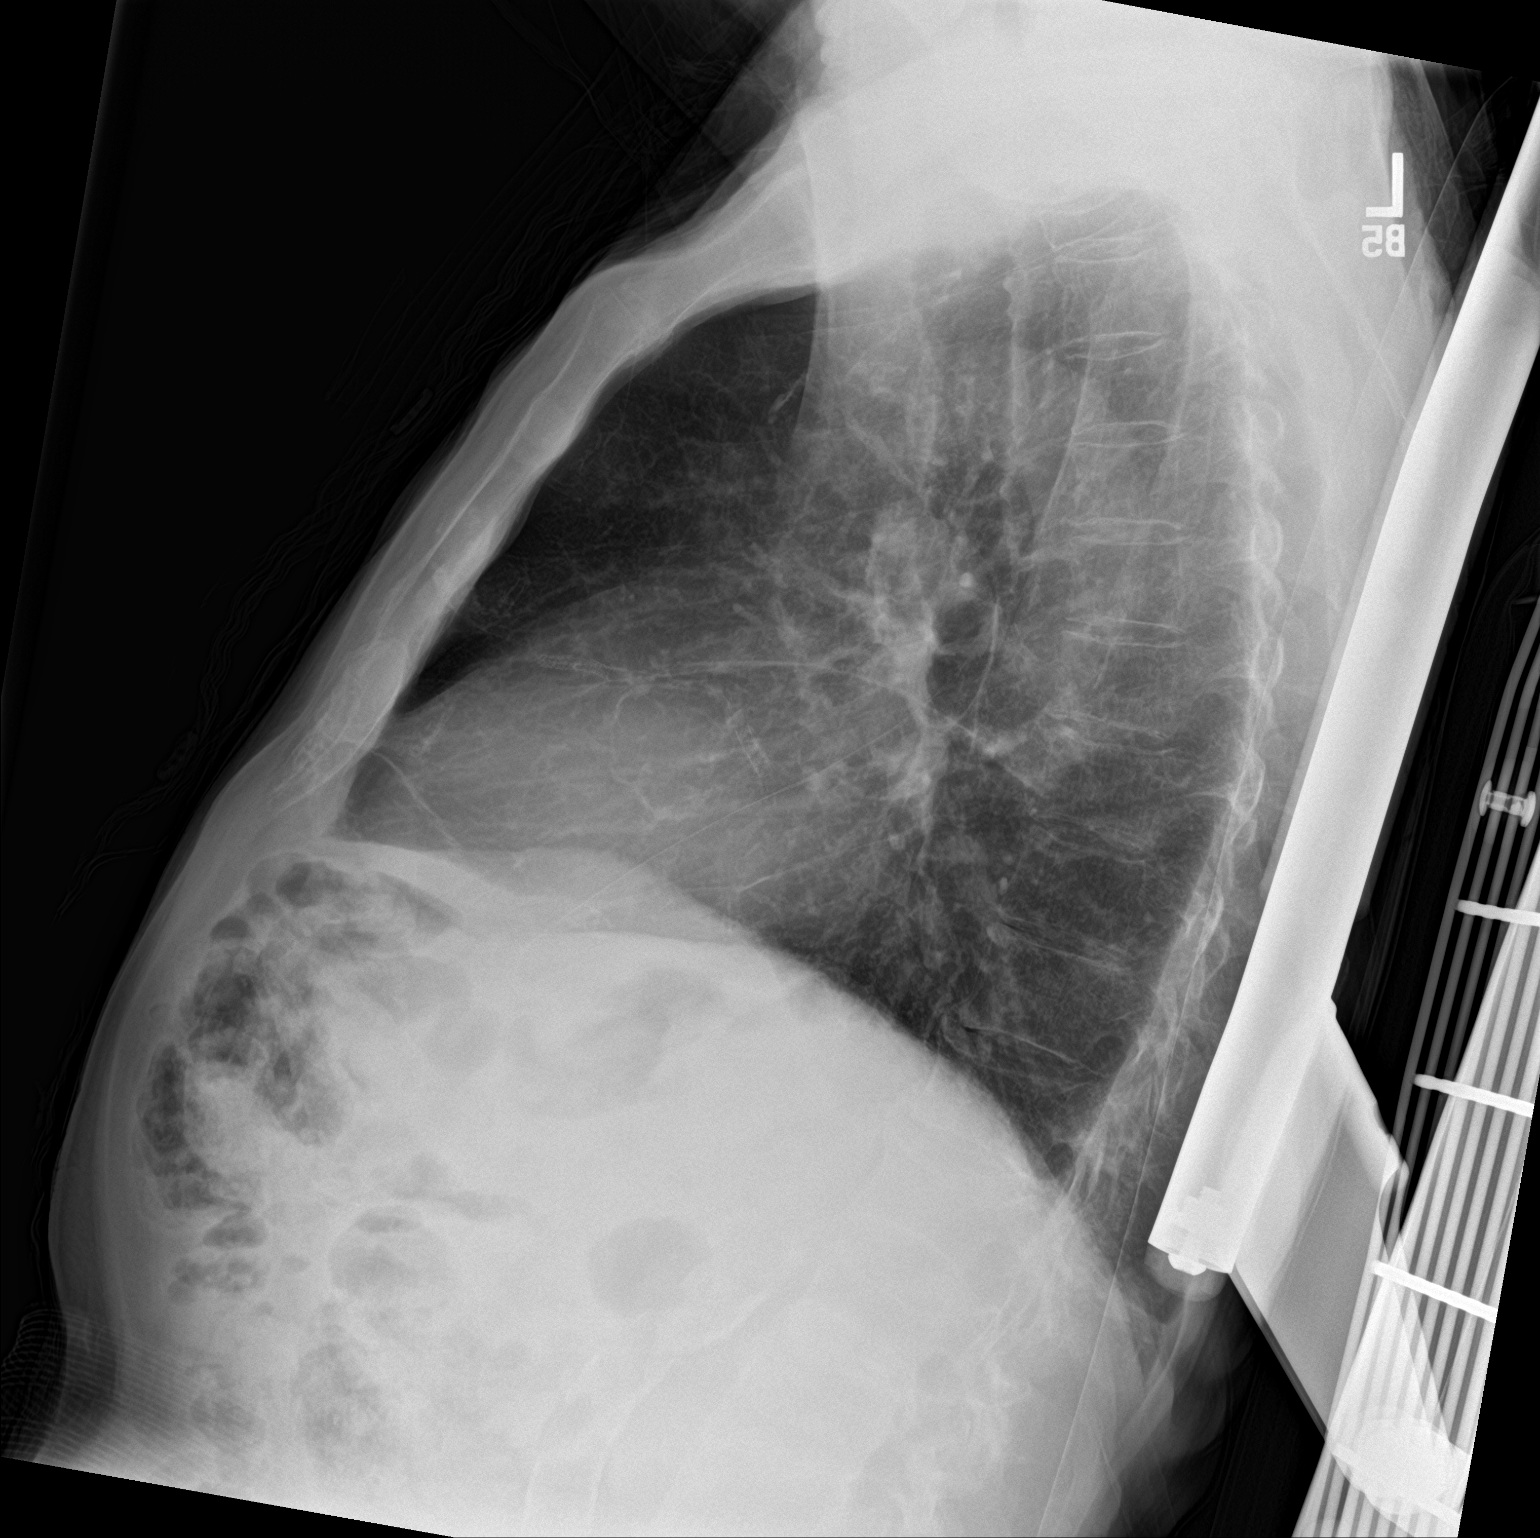

[chest ap]
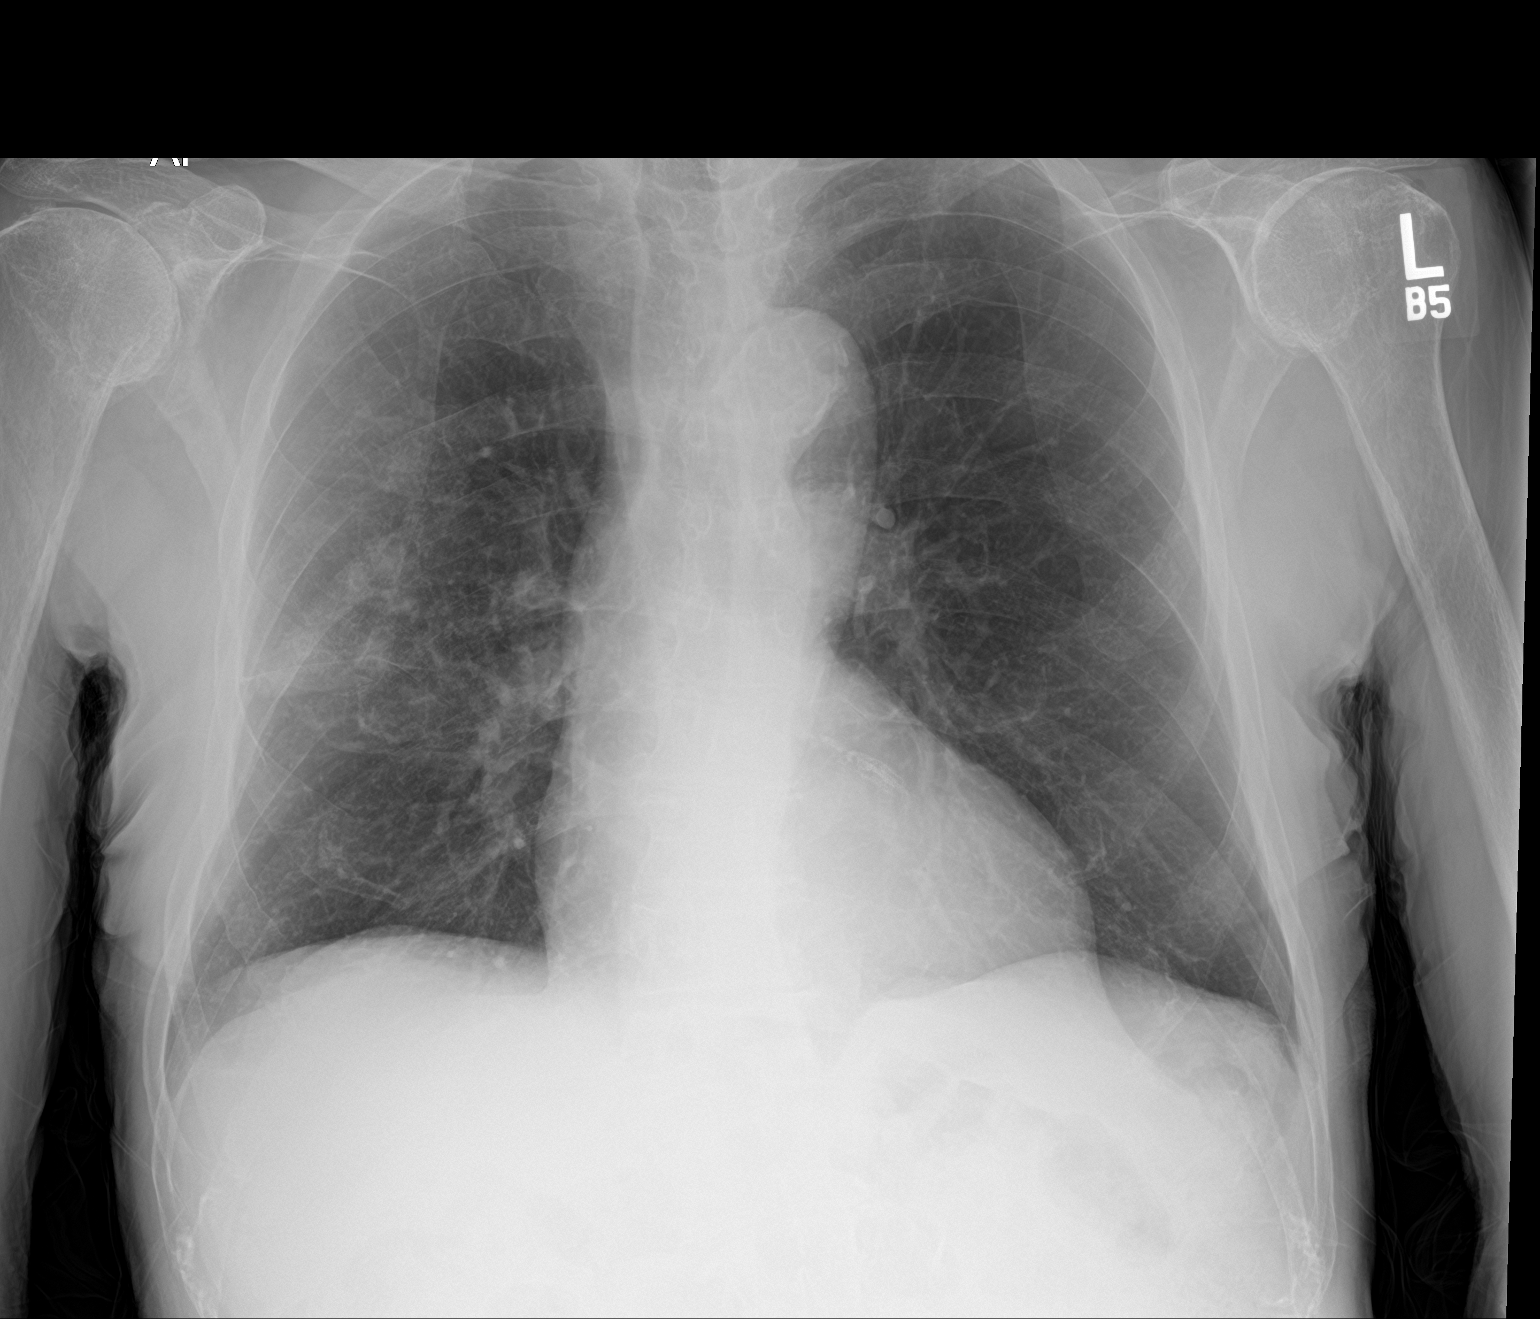

[2 of 2 positions shown; findings below may reference images not displayed]

FINDINGS: There is a small hazy infiltrate in the right midzone. The lungs are
otherwise clear. Heart size and vascularity are normal. Aortic
atherosclerosis. No acute bone abnormality. Coronary stents in
place.
IMPRESSION: Small faint area of infiltrate in the right midzone. This is
consistent with pneumonia.

Aortic Atherosclerosis ([id number]).

## 2019-03-24 ENCOUNTER — Other Ambulatory Visit: Payer: Self-pay

## 2019-03-24 ENCOUNTER — Encounter (HOSPITAL_COMMUNITY): Payer: Self-pay | Admitting: Emergency Medicine

## 2019-03-24 ENCOUNTER — Emergency Department (HOSPITAL_COMMUNITY): Payer: Medicare Other

## 2019-03-24 ENCOUNTER — Inpatient Hospital Stay (HOSPITAL_COMMUNITY)
Admission: EM | Admit: 2019-03-24 | Discharge: 2019-03-27 | DRG: 682 | Disposition: A | Discharge status: Hospice | Payer: Medicare Other | Attending: Internal Medicine | Admitting: Internal Medicine

## 2019-03-24 DIAGNOSIS — J449 Chronic obstructive pulmonary disease, unspecified: Secondary | ICD-10-CM | POA: Diagnosis present

## 2019-03-24 DIAGNOSIS — Z8673 Personal history of transient ischemic attack (TIA), and cerebral infarction without residual deficits: Secondary | ICD-10-CM

## 2019-03-24 DIAGNOSIS — Z7951 Long term (current) use of inhaled steroids: Secondary | ICD-10-CM

## 2019-03-24 DIAGNOSIS — Z7902 Long term (current) use of antithrombotics/antiplatelets: Secondary | ICD-10-CM

## 2019-03-24 DIAGNOSIS — R197 Diarrhea, unspecified: Secondary | ICD-10-CM | POA: Diagnosis present

## 2019-03-24 DIAGNOSIS — R627 Adult failure to thrive: Secondary | ICD-10-CM | POA: Diagnosis present

## 2019-03-24 DIAGNOSIS — N179 Acute kidney failure, unspecified: Secondary | ICD-10-CM | POA: Diagnosis not present

## 2019-03-24 DIAGNOSIS — R778 Other specified abnormalities of plasma proteins: Secondary | ICD-10-CM

## 2019-03-24 DIAGNOSIS — E785 Hyperlipidemia, unspecified: Secondary | ICD-10-CM | POA: Diagnosis present

## 2019-03-24 DIAGNOSIS — Z87440 Personal history of urinary (tract) infections: Secondary | ICD-10-CM

## 2019-03-24 DIAGNOSIS — Z681 Body mass index (BMI) 19 or less, adult: Secondary | ICD-10-CM

## 2019-03-24 DIAGNOSIS — Z79899 Other long term (current) drug therapy: Secondary | ICD-10-CM

## 2019-03-24 DIAGNOSIS — F0391 Unspecified dementia with behavioral disturbance: Secondary | ICD-10-CM | POA: Diagnosis present

## 2019-03-24 DIAGNOSIS — I10 Essential (primary) hypertension: Secondary | ICD-10-CM | POA: Diagnosis present

## 2019-03-24 DIAGNOSIS — E860 Dehydration: Secondary | ICD-10-CM

## 2019-03-24 DIAGNOSIS — R413 Other amnesia: Secondary | ICD-10-CM | POA: Diagnosis present

## 2019-03-24 DIAGNOSIS — I2510 Atherosclerotic heart disease of native coronary artery without angina pectoris: Secondary | ICD-10-CM | POA: Diagnosis present

## 2019-03-24 DIAGNOSIS — I5032 Chronic diastolic (congestive) heart failure: Secondary | ICD-10-CM | POA: Diagnosis present

## 2019-03-24 DIAGNOSIS — E43 Unspecified severe protein-calorie malnutrition: Secondary | ICD-10-CM | POA: Diagnosis present

## 2019-03-24 DIAGNOSIS — Z515 Encounter for palliative care: Secondary | ICD-10-CM | POA: Diagnosis not present

## 2019-03-24 DIAGNOSIS — Z20828 Contact with and (suspected) exposure to other viral communicable diseases: Secondary | ICD-10-CM | POA: Diagnosis present

## 2019-03-24 DIAGNOSIS — Z955 Presence of coronary angioplasty implant and graft: Secondary | ICD-10-CM

## 2019-03-24 DIAGNOSIS — Z888 Allergy status to other drugs, medicaments and biological substances status: Secondary | ICD-10-CM

## 2019-03-24 DIAGNOSIS — R7989 Other specified abnormal findings of blood chemistry: Secondary | ICD-10-CM

## 2019-03-24 DIAGNOSIS — Z886 Allergy status to analgesic agent status: Secondary | ICD-10-CM

## 2019-03-24 DIAGNOSIS — Z66 Do not resuscitate: Secondary | ICD-10-CM | POA: Diagnosis present

## 2019-03-24 DIAGNOSIS — I110 Hypertensive heart disease with heart failure: Secondary | ICD-10-CM | POA: Diagnosis present

## 2019-03-24 DIAGNOSIS — K5790 Diverticulosis of intestine, part unspecified, without perforation or abscess without bleeding: Secondary | ICD-10-CM | POA: Diagnosis present

## 2019-03-24 LAB — URINALYSIS, ROUTINE W REFLEX MICROSCOPIC
Bilirubin Urine: NEGATIVE
Glucose, UA: NEGATIVE mg/dL
Ketones, ur: 5 mg/dL — AB
Leukocytes,Ua: NEGATIVE
Nitrite: NEGATIVE
Protein, ur: 30 mg/dL — AB
RBC / HPF: >50 RBC/hpf — ABNORMAL HIGH (ref 0–5)
Specific Gravity, Urine: 1.019 (ref 1.005–1.030)
pH: 5 (ref 5.0–8.0)

## 2019-03-24 LAB — SARS CORONAVIRUS 2 BY RT PCR (HOSPITAL ORDER, PERFORMED IN ~~LOC~~ HOSPITAL LAB): SARS Coronavirus 2: NEGATIVE

## 2019-03-24 LAB — COMPREHENSIVE METABOLIC PANEL
ALT: 13 U/L (ref 0–44)
AST: 19 U/L (ref 15–41)
Albumin: 4.2 g/dL (ref 3.5–5.0)
Alkaline Phosphatase: 124 U/L (ref 38–126)
Anion gap: 15 (ref 5–15)
BUN: 51 mg/dL — ABNORMAL HIGH (ref 8–23)
CO2: 18 mmol/L — ABNORMAL LOW (ref 22–32)
Calcium: 8.6 mg/dL — ABNORMAL LOW (ref 8.9–10.3)
Chloride: 107 mmol/L (ref 98–111)
Creatinine, Ser: 2.94 mg/dL — ABNORMAL HIGH (ref 0.61–1.24)
GFR calc Af Amer: 21 mL/min — ABNORMAL LOW (ref >60)
GFR calc non Af Amer: 18 mL/min — ABNORMAL LOW (ref >60)
Glucose, Bld: 111 mg/dL — ABNORMAL HIGH (ref 70–99)
Potassium: 3.6 mmol/L (ref 3.5–5.1)
Sodium: 140 mmol/L (ref 135–145)
Total Bilirubin: 0.8 mg/dL (ref 0.3–1.2)
Total Protein: 6.8 g/dL (ref 6.5–8.1)

## 2019-03-24 LAB — CBG MONITORING, ED: Glucose-Capillary: 101 mg/dL — ABNORMAL HIGH (ref 70–99)

## 2019-03-24 LAB — CBC WITH DIFFERENTIAL/PLATELET
Abs Immature Granulocytes: 0.02 10*3/uL (ref 0.00–0.07)
Basophils Absolute: 0 10*3/uL (ref 0.0–0.1)
Basophils Relative: 0 %
Eosinophils Absolute: 0.1 10*3/uL (ref 0.0–0.5)
Eosinophils Relative: 2 %
HCT: 41 % (ref 39.0–52.0)
Hemoglobin: 12.7 g/dL — ABNORMAL LOW (ref 13.0–17.0)
Immature Granulocytes: 0 %
Lymphocytes Relative: 19 %
Lymphs Abs: 1.6 10*3/uL (ref 0.7–4.0)
MCH: 28.1 pg (ref 26.0–34.0)
MCHC: 31 g/dL (ref 30.0–36.0)
MCV: 90.7 fL (ref 80.0–100.0)
Monocytes Absolute: 1.3 10*3/uL — ABNORMAL HIGH (ref 0.1–1.0)
Monocytes Relative: 15 %
Neutro Abs: 5.6 10*3/uL (ref 1.7–7.7)
Neutrophils Relative %: 64 %
Platelets: 206 10*3/uL (ref 150–400)
RBC: 4.52 MIL/uL (ref 4.22–5.81)
RDW: 14.1 % (ref 11.5–15.5)
WBC: 8.6 10*3/uL (ref 4.0–10.5)
nRBC: 0 % (ref 0.0–0.2)

## 2019-03-24 LAB — TROPONIN I: Troponin I: 0.04 ng/mL (ref <0.03)

## 2019-03-24 LAB — LIPASE, BLOOD: Lipase: 17 U/L (ref 11–51)

## 2019-03-24 LAB — POC OCCULT BLOOD, ED: Fecal Occult Bld: NEGATIVE

## 2019-03-24 MED ORDER — SODIUM CHLORIDE 0.9 % IV BOLUS
500.0000 mL | Freq: Once | INTRAVENOUS | Status: AC
  Administered 2019-03-24: 500 mL via INTRAVENOUS

## 2019-03-24 MED ORDER — ONDANSETRON HCL 4 MG/2ML IJ SOLN: 4.0000 mg | Freq: Four times a day (QID) | INTRAMUSCULAR | Status: DC | PRN

## 2019-03-24 MED ORDER — ONDANSETRON HCL 4 MG PO TABS: 4.0000 mg | ORAL_TABLET | Freq: Four times a day (QID) | ORAL | Status: DC | PRN

## 2019-03-24 MED ORDER — PANTOPRAZOLE SODIUM 40 MG PO TBEC
40.0000 mg | DELAYED_RELEASE_TABLET | Freq: Every day | ORAL | Status: DC
  Administered 2019-03-26 – 2019-03-27 (×2): 40 mg via ORAL
  Filled 2019-03-24 (×2): qty 1

## 2019-03-24 MED ORDER — LORAZEPAM 2 MG/ML IJ SOLN
0.5000 mg | Freq: Once | INTRAMUSCULAR | Status: AC
  Administered 2019-03-24: 0.5 mg via INTRAVENOUS
  Filled 2019-03-24: qty 1

## 2019-03-24 MED ORDER — ACETAMINOPHEN 650 MG RE SUPP: 650.0000 mg | Freq: Four times a day (QID) | RECTAL | Status: DC | PRN

## 2019-03-24 MED ORDER — ACETAMINOPHEN 325 MG PO TABS: 650.0000 mg | ORAL_TABLET | Freq: Four times a day (QID) | ORAL | Status: DC | PRN

## 2019-03-24 MED ORDER — MONTELUKAST SODIUM 10 MG PO TABS
10.0000 mg | ORAL_TABLET | Freq: Every day | ORAL | Status: DC
  Administered 2019-03-24 – 2019-03-26 (×3): 10 mg via ORAL
  Filled 2019-03-24 (×3): qty 1

## 2019-03-24 MED ORDER — QUETIAPINE FUMARATE 25 MG PO TABS
25.0000 mg | ORAL_TABLET | Freq: Three times a day (TID) | ORAL | Status: DC | PRN
  Administered 2019-03-24 – 2019-03-26 (×3): 25 mg via ORAL
  Filled 2019-03-24 (×3): qty 1

## 2019-03-24 MED ORDER — ENOXAPARIN SODIUM 30 MG/0.3ML ~~LOC~~ SOLN
30.0000 mg | SUBCUTANEOUS | Status: DC
  Administered 2019-03-24 – 2019-03-26 (×3): 30 mg via SUBCUTANEOUS
  Filled 2019-03-24 (×3): qty 0.3

## 2019-03-24 MED ORDER — LACTATED RINGERS IV SOLN
INTRAVENOUS | Status: AC
  Administered 2019-03-24: 17:00:00 via INTRAVENOUS

## 2019-03-24 MED ORDER — CLOPIDOGREL BISULFATE 75 MG PO TABS
75.0000 mg | ORAL_TABLET | Freq: Every day | ORAL | Status: DC
  Administered 2019-03-26 – 2019-03-27 (×2): 75 mg via ORAL
  Filled 2019-03-24 (×2): qty 1

## 2019-03-24 NOTE — ED Notes (Signed)
Date and time results received: 03/24/19 1152 (use smartphrase ".now" to insert current time)  Test: troponin Critical Value: 0.04  Name of Provider Notified: Dr. Ellender Hose  Orders Received? Or Actions Taken?:
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
## 2019-03-24 NOTE — ED Notes (Signed)
Nurses attempted to collect a straight cath urine sample and pt starting screaming in pain and pulling at our hands. Pt is confused. Pts wife is at bedside and she tells [place] to stop. Pt was tearful.
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
## 2019-03-24 NOTE — ED Triage Notes (Signed)
Pt presents to ed for dehydration bc of having diarrhea for a week. His Dr. Roque Cash at [facility] took him off all of his medicines except Plavix. Dr. Has called in palliative care next week. Pt has a decrease in appetite only ate 4 bites yesterday.

## 2019-03-24 NOTE — Progress Notes (Signed)
New Admission Note:   Arrival Method: ED via [place] to person, disoriented to place and time Telemetry: none Assessment: Completed, see flowsheet Skin: Intact IV: Right AC Pain:denies Tubes: None Safety Measures: Safety Fall Prevention Plan has been discussed  Admission: completed 5 [place] Orientation: Patient has been orientated to the room, unit and staff.   Family: wife  Orders to be reviewed and implemented. Will continue to monitor the patient. Call light has been placed within reach and bed alarm has been activated.

## 2019-03-24 NOTE — ED Notes (Signed)
One visitor is staying with pt.

## 2019-03-25 DIAGNOSIS — Z955 Presence of coronary angioplasty implant and graft: Secondary | ICD-10-CM | POA: Diagnosis not present

## 2019-03-25 DIAGNOSIS — F0391 Unspecified dementia with behavioral disturbance: Secondary | ICD-10-CM

## 2019-03-25 DIAGNOSIS — Z8673 Personal history of transient ischemic attack (TIA), and cerebral infarction without residual deficits: Secondary | ICD-10-CM | POA: Diagnosis not present

## 2019-03-25 DIAGNOSIS — Z886 Allergy status to analgesic agent status: Secondary | ICD-10-CM | POA: Diagnosis not present

## 2019-03-25 DIAGNOSIS — Z66 Do not resuscitate: Secondary | ICD-10-CM | POA: Diagnosis present

## 2019-03-25 DIAGNOSIS — N179 Acute kidney failure, unspecified: Principal | ICD-10-CM

## 2019-03-25 DIAGNOSIS — Z515 Encounter for palliative care: Secondary | ICD-10-CM | POA: Diagnosis not present

## 2019-03-25 DIAGNOSIS — R413 Other amnesia: Secondary | ICD-10-CM | POA: Diagnosis not present

## 2019-03-25 DIAGNOSIS — J449 Chronic obstructive pulmonary disease, unspecified: Secondary | ICD-10-CM | POA: Diagnosis present

## 2019-03-25 DIAGNOSIS — Z681 Body mass index (BMI) 19 or less, adult: Secondary | ICD-10-CM | POA: Diagnosis not present

## 2019-03-25 DIAGNOSIS — I5032 Chronic diastolic (congestive) heart failure: Secondary | ICD-10-CM

## 2019-03-25 DIAGNOSIS — I2510 Atherosclerotic heart disease of native coronary artery without angina pectoris: Secondary | ICD-10-CM | POA: Diagnosis present

## 2019-03-25 DIAGNOSIS — Z888 Allergy status to other drugs, medicaments and biological substances status: Secondary | ICD-10-CM | POA: Diagnosis not present

## 2019-03-25 DIAGNOSIS — Z7951 Long term (current) use of inhaled steroids: Secondary | ICD-10-CM | POA: Diagnosis not present

## 2019-03-25 DIAGNOSIS — Z87440 Personal history of urinary (tract) infections: Secondary | ICD-10-CM | POA: Diagnosis not present

## 2019-03-25 DIAGNOSIS — E860 Dehydration: Secondary | ICD-10-CM | POA: Diagnosis present

## 2019-03-25 DIAGNOSIS — R197 Diarrhea, unspecified: Secondary | ICD-10-CM | POA: Diagnosis present

## 2019-03-25 DIAGNOSIS — E785 Hyperlipidemia, unspecified: Secondary | ICD-10-CM | POA: Diagnosis present

## 2019-03-25 DIAGNOSIS — I10 Essential (primary) hypertension: Secondary | ICD-10-CM | POA: Diagnosis not present

## 2019-03-25 DIAGNOSIS — E43 Unspecified severe protein-calorie malnutrition: Secondary | ICD-10-CM | POA: Diagnosis present

## 2019-03-25 DIAGNOSIS — Z20828 Contact with and (suspected) exposure to other viral communicable diseases: Secondary | ICD-10-CM | POA: Diagnosis present

## 2019-03-25 DIAGNOSIS — Z7902 Long term (current) use of antithrombotics/antiplatelets: Secondary | ICD-10-CM | POA: Diagnosis not present

## 2019-03-25 DIAGNOSIS — Z7189 Other specified counseling: Secondary | ICD-10-CM

## 2019-03-25 DIAGNOSIS — Z79899 Other long term (current) drug therapy: Secondary | ICD-10-CM | POA: Diagnosis not present

## 2019-03-25 DIAGNOSIS — R627 Adult failure to thrive: Secondary | ICD-10-CM | POA: Diagnosis present

## 2019-03-25 DIAGNOSIS — K5790 Diverticulosis of intestine, part unspecified, without perforation or abscess without bleeding: Secondary | ICD-10-CM | POA: Diagnosis present

## 2019-03-25 DIAGNOSIS — I110 Hypertensive heart disease with heart failure: Secondary | ICD-10-CM | POA: Diagnosis present

## 2019-03-25 LAB — BASIC METABOLIC PANEL
Anion gap: 11 (ref 5–15)
BUN: 39 mg/dL — ABNORMAL HIGH (ref 8–23)
CO2: 17 mmol/L — ABNORMAL LOW (ref 22–32)
Calcium: 8.1 mg/dL — ABNORMAL LOW (ref 8.9–10.3)
Chloride: 115 mmol/L — ABNORMAL HIGH (ref 98–111)
Creatinine, Ser: 1.36 mg/dL — ABNORMAL HIGH (ref 0.61–1.24)
GFR calc Af Amer: 54 mL/min — ABNORMAL LOW (ref >60)
GFR calc non Af Amer: 47 mL/min — ABNORMAL LOW (ref >60)
Glucose, Bld: 95 mg/dL (ref 70–99)
Potassium: 3.4 mmol/L — ABNORMAL LOW (ref 3.5–5.1)
Sodium: 143 mmol/L (ref 135–145)

## 2019-03-25 LAB — CBC
HCT: 36.1 % — ABNORMAL LOW (ref 39.0–52.0)
Hemoglobin: 11.5 g/dL — ABNORMAL LOW (ref 13.0–17.0)
MCH: 27.9 pg (ref 26.0–34.0)
MCHC: 31.9 g/dL (ref 30.0–36.0)
MCV: 87.6 fL (ref 80.0–100.0)
Platelets: 155 10*3/uL (ref 150–400)
RBC: 4.12 MIL/uL — ABNORMAL LOW (ref 4.22–5.81)
RDW: 14.1 % (ref 11.5–15.5)
WBC: 7.4 10*3/uL (ref 4.0–10.5)
nRBC: 0 % (ref 0.0–0.2)

## 2019-03-25 LAB — URINE CULTURE: Culture: <10000 — AB

## 2019-03-25 MED ORDER — SODIUM CHLORIDE 0.9 % IV SOLN
INTRAVENOUS | Status: DC
  Administered 2019-03-25: 11:00:00 via INTRAVENOUS

## 2019-03-25 NOTE — Care Management Obs Status (Signed)
[place] NOTIFICATION   Patient Details  Name: Edward [name] MRN: [id number] Date of Birth: 1930/04/25   Medicare Observation Status Notification Given:  Yes    Oretha Milch, LCSW 03/25/2019, 1:48 PM
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
## 2019-03-25 NOTE — Progress Notes (Addendum)
PROGRESS NOTE    Edward [name]  EGB:[id number] DOB: 20-Aug-1930 DOA: 03/24/2019 PCP: [name], MD  Brief Narrative: Edward [name] is a 85 y.o. male with medical history significant of CVA; dementia; HTN; HLD; CAD; and COPD presented with dehydration.  He has had diarrhea most of the week,worse since Wednesday.  They did a virtual visit and had labs ordered - kidney function was worsening.  PCP stopped metoprolol, Lipitor -plan to send out palliative care for evaluation next week.    He has not been eating well.  In the last months, his memory is getting worse.  His wife notices that "he's going down".  They gave him Remeron but he isn't sleeping.  He does know close contacts who he sees regularly. He has lost 16 pounds since January.  His wife is seeing his progressive dementia and he is becoming increasingly hard to care for.   In ED labs noted, AKi, CT showed diarrhea, covid negative  Assessment & Plan:  Dehydration with acute renal failure -Patient with baseline advanced dementia which has progressed over the last 6 months, very poor p.o. intake, recently worsened -Also with diarrhea recently, GI losses -Creatinine improving, continue IV fluids today -Has minimal to no oral intake at this time, not stable for discharge home today, will need more goals of care discussion with family, palliative medicine consulted  Diarrhea -Uncertain if this is infectious  -C. difficile PCR is pending, SARS COVID-19 is negative -Continue liquid diet, supportive care, advance diet as tolerated  Advanced dementia -Patient has advanced dementia, with worsening confusion, cognition -With ongoing failure to thrive, very poor p.o. intake, weight loss and severe malnutrition -Palliative consulted for goals of care, this was being set up as outpatient for evaluation -Suspect he might meet criteria for hospice  HTN -ACE inhibitor on hold  HLD -Lipitor stopped by PCP  Chronic diastolic CHF -2/5  Echo with grade 2 diastolic dysfunction -No evidence of volume overload at this time  COPD -Appears compensated at this time  History of CVA -On Plavix   DVT prophylaxis:  Lovenox  Code Status:  DNR - = Family Communication:  No family at bedside, wife Edward [name] updated via telephone Disposition Plan:   To be determined, pending palliative family meeting  Consultants:  Palliative medicine   Procedures:   Antimicrobials:    Subjective: -Restless agitated, diarrhea is improving  Objective: Vitals:   03/24/19 1626 03/24/19 2005 03/25/19 0519 03/25/19 0940  BP: (!) 125/56 115/62 99/67 (!) 126/56  Pulse: 89 (!) 108 95 83  Resp: 19 16 16 18   Temp: 99.3 F (37.4 C) 98.5 F (36.9 C) 99.2 F (37.3 C) 99 F (37.2 C)  TempSrc: Oral Oral Oral Axillary  SpO2: 99% 96% 97% 96%  Weight:  43.1 kg    Height:        Intake/Output Summary (Last 24 hours) at 03/25/2019 1251 Last data filed at 03/25/2019 1159 Gross per 24 hour  Intake 1365.9 ml  Output 0 ml  Net 1365.9 ml   Filed Weights   03/24/19 0958 03/24/19 2005  Weight: 40.8 kg 43.1 kg    Examination:  General exam: Frail debilitated chronically ill-appearing male, awake alert, pleasantly confused Respiratory system: Poor air movement, otherwise clear  cardiovascular system: S1 & S2 heard, RRR.   Gastrointestinal system: Abdomen is nondistended, soft and nontender.Normal bowel sounds heard. Central nervous system: awake alert, confused Extremities: No edema Skin: No rashes, lesions or ulcers Psychiatry: Very poor judgement and  insight .     Data Reviewed:   CBC: Recent Labs  Lab 03/24/19 1058 03/25/19 0810  WBC 8.6 7.4  NEUTROABS 5.6  --   HGB 12.7* 11.5*  HCT 41.0 36.1*  MCV 90.7 87.6  PLT 206 294   Basic Metabolic Panel: Recent Labs  Lab 03/24/19 1058 03/25/19 0810  NA 140 143  K 3.6 3.4*  CL 107 115*  CO2 18* 17*  GLUCOSE 111* 95  BUN 51* 39*  CREATININE 2.94* 1.36*  CALCIUM 8.6*  8.1*   GFR: Estimated Creatinine Clearance: 23.8 mL/min (A) (by C-G formula based on SCr of 1.36 mg/dL (H)). Liver Function Tests: Recent Labs  Lab 03/24/19 1058  AST 19  ALT 13  ALKPHOS 124  BILITOT 0.8  PROT 6.8  ALBUMIN 4.2   Recent Labs  Lab 03/24/19 1058  LIPASE 17   No results for input(s): AMMONIA in the last 168 hours. Coagulation Profile: No results for input(s): INR, PROTIME in the last 168 hours. Cardiac Enzymes: Recent Labs  Lab 03/24/19 1100  TROPONINI 0.04*   BNP (last 3 results) No results for input(s): PROBNP in the last 8760 hours. HbA1C: No results for input(s): HGBA1C in the last 72 hours. CBG: Recent Labs  Lab 03/24/19 1000  GLUCAP 101*   Lipid Profile: No results for input(s): CHOL, HDL, LDLCALC, TRIG, CHOLHDL, LDLDIRECT in the last 72 hours. Thyroid Function Tests: No results for input(s): TSH, T4TOTAL, FREET4, T3FREE, THYROIDAB in the last 72 hours. Anemia Panel: No results for input(s): VITAMINB12, FOLATE, FERRITIN, TIBC, IRON, RETICCTPCT in the last 72 hours. Urine analysis:    Component Value Date/Time   COLORURINE YELLOW 03/24/2019 1453   APPEARANCEUR HAZY (A) 03/24/2019 1453   LABSPEC 1.019 03/24/2019 1453   PHURINE 5.0 03/24/2019 1453   GLUCOSEU NEGATIVE 03/24/2019 1453   HGBUR LARGE (A) 03/24/2019 1453   BILIRUBINUR NEGATIVE 03/24/2019 1453   KETONESUR 5 (A) 03/24/2019 1453   PROTEINUR 30 (A) 03/24/2019 1453   UROBILINOGEN 1.0 01/24/2012 1216   NITRITE NEGATIVE 03/24/2019 1453   LEUKOCYTESUR NEGATIVE 03/24/2019 1453   Sepsis Labs: @LABRCNTIP (procalcitonin:4,lacticidven:4)  ) Recent Results (from the past 240 hour(s))  Blood culture (routine x 2)     Status: None (Preliminary result)   Collection Time: 03/24/19 10:40 AM  Result Value Ref Range Status   Specimen Description BLOOD LEFT WRIST  Final   Special Requests   Final    BOTTLES DRAWN AEROBIC ONLY Blood Culture results may not be optimal due to an inadequate  volume of blood received in culture bottles   Culture   Final    NO GROWTH < 24 HOURS Performed at [facility] [address], [place], [place] [address]    Report Status PENDING  Incomplete  Blood culture (routine x 2)     Status: None (Preliminary result)   Collection Time: 03/24/19 10:56 AM  Result Value Ref Range Status   Specimen Description BLOOD RIGHT ANTECUBITAL  Final   Special Requests   Final    BOTTLES DRAWN AEROBIC AND ANAEROBIC Blood Culture results may not be optimal due to an inadequate volume of blood received in culture bottles   Culture   Final    NO GROWTH < 24 HOURS Performed at [facility], [place] [address], [place], [place] [address]    Report Status PENDING  Incomplete  SARS Coronavirus 2 [facility] order, Performed in [facility] hospital lab)     Status: None  Collection Time: 03/24/19 11:03 AM  Result Value Ref Range Status   SARS Coronavirus 2 NEGATIVE NEGATIVE Final    Comment: (NOTE) If result is NEGATIVE SARS-CoV-2 target nucleic acids are NOT DETECTED. The SARS-CoV-2 RNA is generally detectable in upper and lower  respiratory specimens during the acute phase of infection. The lowest  concentration of SARS-CoV-2 viral copies this assay can detect is 250  copies / mL. A negative result does not preclude SARS-CoV-2 infection  and should not be used as the sole basis for treatment or other  patient management decisions.  A negative result may occur with  improper specimen collection / handling, submission of specimen other  than nasopharyngeal swab, presence of viral mutation(s) within the  areas targeted by this assay, and inadequate number of viral copies  (<250 copies / mL). A negative result must be combined with clinical  observations, patient history, and epidemiological information. If result is POSITIVE SARS-CoV-2 target nucleic acids are DETECTED. The SARS-CoV-2 RNA is generally detectable in upper and lower  respiratory  specimens dur ing the acute phase of infection.  Positive  results are indicative of active infection with SARS-CoV-2.  Clinical  correlation with patient history and other diagnostic information is  necessary to determine patient infection status.  Positive results do  not rule out bacterial infection or co-infection with other viruses. If result is PRESUMPTIVE POSTIVE SARS-CoV-2 nucleic acids MAY BE PRESENT.   A presumptive positive result was obtained on the submitted specimen  and confirmed on repeat testing.  While 2019 novel coronavirus  (SARS-CoV-2) nucleic acids may be present in the submitted sample  additional confirmatory testing may be necessary for epidemiological  and / or clinical management purposes  to differentiate between  SARS-CoV-2 and other Sarbecovirus currently known to infect humans.  If clinically indicated additional testing with an alternate test  methodology [phone]) is advised. The SARS-CoV-2 RNA is generally  detectable in upper and lower respiratory sp ecimens during the acute  phase of infection. The expected result is Negative. Fact Sheet for Patients:  StrictlyIdeas.no Fact Sheet for Healthcare Providers: BankingDealers.co.za This test is not yet approved or cleared by the [place] FDA and has been authorized for detection and/or diagnosis of SARS-CoV-2 by FDA under an Emergency Use Authorization (EUA).  This EUA will remain in effect (meaning this test can be used) for the duration of the COVID-19 declaration under Section 564(b)(1) of the Act, 21 U.S.C. section 360bbb-3(b)(1), unless the authorization is terminated or revoked sooner. Performed at [facility], [place] [address] [place], [place] [address]   Urine culture     Status: Abnormal   Collection Time: 03/24/19  2:53 PM  Result Value Ref Range Status   Specimen Description URINE, CLEAN CATCH  Final   Special Requests NONE  Final    Culture (A)  Final    <10,000 COLONIES/mL INSIGNIFICANT GROWTH Performed at [facility], [place] [address] [place], [place] [address]    Report Status 03/25/2019 FINAL  Final         Radiology Studies: Ct Abdomen Pelvis Wo Contrast  Result Date: 03/24/2019 CLINICAL DATA:  Pt presents to ED for dehydration because of having diarrhea for a week EXAM: CT ABDOMEN AND PELVIS WITHOUT CONTRAST TECHNIQUE: Multidetector CT imaging of the abdomen and pelvis was performed following the standard protocol without IV contrast. COMPARISON:  09/18/2017 FINDINGS: Lower chest: No acute abnormality.  Coronary artery atherosclerosis. Hepatobiliary: No focal hepatic mass. Distended gallbladder with  possible small gallstone versus polyp along the dependent aspect. No intrahepatic or extrahepatic biliary ductal dilatation. Pancreas: Unremarkable. No pancreatic ductal dilatation or surrounding inflammatory changes. Spleen: Normal in size without focal abnormality. Adrenals/Urinary Tract: Normal adrenal glands. 4.4 x 5 cm stable right renal cyst. No urolithiasis or obstructive uropathy. Bladder wall thickening which may be secondary to underdistention versus bladder outlet obstruction. Stomach/Bowel: Stomach is normal. No bowel dilatation to suggest bowel obstruction. Air-fluid level in the ascending colon with small amount of fluid in the rectosigmoid colon as can be seen with diarrhea. No pneumatosis, pneumoperitoneum or portal venous gas. Vascular/Lymphatic: Normal caliber abdominal aorta with mild atherosclerosis. No lymphadenopathy. Reproductive: Mildly enlarged prostate gland. Other: No abdominal wall hernia or abnormality. No abdominopelvic ascites. Musculoskeletal: No acute osseous abnormality. No aggressive osseous lesion. Degenerative disease with disc height loss throughout the thoracolumbar spine with facet arthropathy. Chronic T11 and T12 vertebral body compression fractures. Prior left hip ORIF. IMPRESSION:  1. Air-fluid level in the ascending colon with small amount of fluid in the rectosigmoid colon as can be seen with diarrhea. 2. Bladder wall thickening which may be secondary to underdistention versus bladder outlet obstruction. Mildly enlarged prostate gland. 3. Distended gallbladder with possible small gallstone versus polyp along the dependent aspect. 4.  Aortic Atherosclerosis (ICD10-I70.0). Electronically Signed   By: Kathreen Devoid   On: 03/24/2019 12:18   Dg Chest 1 View  Result Date: 03/24/2019 CLINICAL DATA:  Dehydration EXAM: CHEST  1 VIEW COMPARISON:  12/27/2018 chest radiograph. FINDINGS: Stable cardiomediastinal silhouette with normal heart size. No pneumothorax. No pleural effusion. Lungs appear clear, with no acute consolidative airspace disease and no pulmonary edema. IMPRESSION: No active disease. Electronically Signed   By: Ilona Sorrel M.D.   On: 03/24/2019 14:25        Scheduled Meds:  clopidogrel  75 mg Oral Daily   enoxaparin (LOVENOX) injection  30 mg Subcutaneous Q24H   montelukast  10 mg Oral QHS   pantoprazole  40 mg Oral Daily   Continuous Infusions:  sodium chloride 75 mL/hr at 03/25/19 1043     LOS: 0 days    Time spent: 36min    Domenic Polite, MD [facility]  03/25/2019, 12:51 PM

## 2019-03-26 ENCOUNTER — Telehealth: Payer: Self-pay

## 2019-03-26 DIAGNOSIS — R413 Other amnesia: Secondary | ICD-10-CM

## 2019-03-26 NOTE — Telephone Encounter (Signed)
Received Palliative Care referral from PCP Claud Kelp NP on 03/23/2019.  Noted that patient was admitted to hospital on 03/24/2019.

## 2019-03-26 NOTE — Progress Notes (Signed)
PROGRESS NOTE    Edward [name]  GEX:[id number] DOB: 07/21/1930 DOA: 03/24/2019 PCP: [name], MD  Brief Narrative: Edward [name] is a 85 y.o. male with medical history significant of CVA; dementia; HTN; HLD; CAD; and COPD presented with dehydration.  He has had diarrhea most of the week,worse since Wednesday.  They did a virtual visit and had labs ordered - kidney function was worsening.  PCP stopped metoprolol, Lipitor -plan to send out palliative care for evaluation next week.    He has not been eating well.  In the last months, his memory is getting worse.  His wife notices that "he's going down".  They gave him Remeron but he isn't sleeping.  He does know close contacts who he sees regularly. He has lost 16 pounds since January.  His wife is seeing his progressive dementia and he is becoming increasingly hard to care for.   In ED labs noted, AKi, CT showed diarrhea, covid negative  Assessment & Plan:  Dehydration with acute renal failure -Patient with baseline advanced dementia which has progressed over the last 6 months, very poor p.o. intake, recently worsened -Worsened in the setting of diarrhea and GI losses -Creatinine improved with hydration -However has minimal oral intake and overall failure to thrive in the setting of advanced dementia, hence palliative medicine was consulted -Status post goals of care discussions yesterday and some more today including discussions with PCP, now plan for home with hospice tomorrow -Case management consulted, equipment to be arranged -Discontinued IV fluids  Diarrhea -Uncertain if this is infectious  -C. difficile PCR is pending, SARS COVID-19 is negative -Appears to have resolved, continue supportive care, advance diet  Advanced dementia -Patient has advanced dementia, with worsening confusion, cognition -With ongoing failure to thrive, very poor p.o. intake, weight loss and severe malnutrition -Palliative consulted for goals of care,  this was being set up as outpatient for evaluation, see discussion regarding palliative consultation obtained inpatient, plan for home with hospice services tomorrow  HTN -ACE inhibitor on hold  HLD -Lipitor stopped by PCP  Chronic diastolic CHF -2/5 Echo with grade 2 diastolic dysfunction -No evidence of volume overload at this time  COPD -Appears compensated at this time  History of CVA -On Plavix   DVT prophylaxis:  Lovenox  Code Status:  DNR - = Family Communication:  No family at bedside, wife Edward [name] updated via telephone yesterday Disposition Plan:   Home with hospice tomorrow  Consultants:  Palliative medicine   Procedures:   Antimicrobials:    Subjective: -Diarrhea is better, oral intake remains very poor  Objective: Vitals:   03/25/19 1653 03/25/19 2005 03/26/19 0519 03/26/19 0900  BP: (!) 127/48 (!) 123/53 (!) 132/48 (!) 124/56  Pulse: 72 78 (!) 41 70  Resp:  18 19 17   Temp: 98.6 F (37 C) 98.7 F (37.1 C) 97.6 F (36.4 C) 98.1 F (36.7 C)  TempSrc: Oral Oral Oral Oral  SpO2:  97% 98% 97%  Weight:  43.1 kg    Height:        Intake/Output Summary (Last 24 hours) at 03/26/2019 1504 Last data filed at 03/26/2019 1021 Gross per 24 hour  Intake 1325.25 ml  Output 200 ml  Net 1125.25 ml   Filed Weights   03/24/19 0958 03/24/19 2005 03/25/19 2005  Weight: 40.8 kg 43.1 kg 43.1 kg    Examination:  Gen: Frail debilitated, chronically ill male awake alert pleasantly confused HEENT: PERRLA, Neck supple, no JVD Lungs: Clear  bilaterally CVS: RRR,No Gallops,Rubs or new Murmurs Abd: soft, Non tender, non distended, BS present Extremities: No edema Skin: no new rashes Psychiatry: Very poor judgement and insight .     Data Reviewed:   CBC: Recent Labs  Lab 03/24/19 1058 03/25/19 0810  WBC 8.6 7.4  NEUTROABS 5.6  --   HGB 12.7* 11.5*  HCT 41.0 36.1*  MCV 90.7 87.6  PLT 206 035   Basic Metabolic Panel: Recent Labs  Lab  03/24/19 1058 03/25/19 0810  NA 140 143  K 3.6 3.4*  CL 107 115*  CO2 18* 17*  GLUCOSE 111* 95  BUN 51* 39*  CREATININE 2.94* 1.36*  CALCIUM 8.6* 8.1*   GFR: Estimated Creatinine Clearance: 23.8 mL/min (A) (by C-G formula based on SCr of 1.36 mg/dL (H)). Liver Function Tests: Recent Labs  Lab 03/24/19 1058  AST 19  ALT 13  ALKPHOS 124  BILITOT 0.8  PROT 6.8  ALBUMIN 4.2   Recent Labs  Lab 03/24/19 1058  LIPASE 17   No results for input(s): AMMONIA in the last 168 hours. Coagulation Profile: No results for input(s): INR, PROTIME in the last 168 hours. Cardiac Enzymes: Recent Labs  Lab 03/24/19 1100  TROPONINI 0.04*   BNP (last 3 results) No results for input(s): PROBNP in the last 8760 hours. HbA1C: No results for input(s): HGBA1C in the last 72 hours. CBG: Recent Labs  Lab 03/24/19 1000  GLUCAP 101*   Lipid Profile: No results for input(s): CHOL, HDL, LDLCALC, TRIG, CHOLHDL, LDLDIRECT in the last 72 hours. Thyroid Function Tests: No results for input(s): TSH, T4TOTAL, FREET4, T3FREE, THYROIDAB in the last 72 hours. Anemia Panel: No results for input(s): VITAMINB12, FOLATE, FERRITIN, TIBC, IRON, RETICCTPCT in the last 72 hours. Urine analysis:    Component Value Date/Time   COLORURINE YELLOW 03/24/2019 1453   APPEARANCEUR HAZY (A) 03/24/2019 1453   LABSPEC 1.019 03/24/2019 1453   PHURINE 5.0 03/24/2019 1453   GLUCOSEU NEGATIVE 03/24/2019 1453   HGBUR LARGE (A) 03/24/2019 1453   BILIRUBINUR NEGATIVE 03/24/2019 1453   KETONESUR 5 (A) 03/24/2019 1453   PROTEINUR 30 (A) 03/24/2019 1453   UROBILINOGEN 1.0 01/24/2012 1216   NITRITE NEGATIVE 03/24/2019 1453   LEUKOCYTESUR NEGATIVE 03/24/2019 1453   Sepsis Labs: @LABRCNTIP (procalcitonin:4,lacticidven:4)  ) Recent Results (from the past 240 hour(s))  Blood culture (routine x 2)     Status: None (Preliminary result)   Collection Time: 03/24/19 10:40 AM  Result Value Ref Range Status   Specimen  Description BLOOD LEFT WRIST  Final   Special Requests   Final    BOTTLES DRAWN AEROBIC ONLY Blood Culture results may not be optimal due to an inadequate volume of blood received in culture bottles   Culture   Final    NO GROWTH 2 DAYS Performed at [facility], [place] [address] [place], [place] [address]    Report Status PENDING  Incomplete  Blood culture (routine x 2)     Status: None (Preliminary result)   Collection Time: 03/24/19 10:56 AM  Result Value Ref Range Status   Specimen Description BLOOD RIGHT ANTECUBITAL  Final   Special Requests   Final    BOTTLES DRAWN AEROBIC AND ANAEROBIC Blood Culture results may not be optimal due to an inadequate volume of blood received in culture bottles   Culture   Final    NO GROWTH 2 DAYS Performed at [facility], [place] [address], [place], [place] [address]  Report Status PENDING  Incomplete  SARS Coronavirus 2 [facility] order, Performed in [place] hospital lab)     Status: None   Collection Time: 03/24/19 11:03 AM  Result Value Ref Range Status   SARS Coronavirus 2 NEGATIVE NEGATIVE Final    Comment: (NOTE) If result is NEGATIVE SARS-CoV-2 target nucleic acids are NOT DETECTED. The SARS-CoV-2 RNA is generally detectable in upper and lower  respiratory specimens during the acute phase of infection. The lowest  concentration of SARS-CoV-2 viral copies this assay can detect is 250  copies / mL. A negative result does not preclude SARS-CoV-2 infection  and should not be used as the sole basis for treatment or other  patient management decisions.  A negative result may occur with  improper specimen collection / handling, submission of specimen other  than nasopharyngeal swab, presence of viral mutation(s) within the  areas targeted by this assay, and inadequate number of viral copies  (<250 copies / mL). A negative result must be combined with clinical  observations, patient history, and epidemiological information. If  result is POSITIVE SARS-CoV-2 target nucleic acids are DETECTED. The SARS-CoV-2 RNA is generally detectable in upper and lower  respiratory specimens dur ing the acute phase of infection.  Positive  results are indicative of active infection with SARS-CoV-2.  Clinical  correlation with patient history and other diagnostic information is  necessary to determine patient infection status.  Positive results do  not rule out bacterial infection or co-infection with other viruses. If result is PRESUMPTIVE POSTIVE SARS-CoV-2 nucleic acids MAY BE PRESENT.   A presumptive positive result was obtained on the submitted specimen  and confirmed on repeat testing.  While 2019 novel coronavirus  (SARS-CoV-2) nucleic acids may be present in the submitted sample  additional confirmatory testing may be necessary for epidemiological  and / or clinical management purposes  to differentiate between  SARS-CoV-2 and other Sarbecovirus currently known to infect humans.  If clinically indicated additional testing with an alternate test  methodology [id number]) is advised. The SARS-CoV-2 RNA is generally  detectable in upper and lower respiratory sp ecimens during the acute  phase of infection. The expected result is Negative. Fact Sheet for Patients:  StrictlyIdeas.no Fact Sheet for Healthcare Providers: BankingDealers.co.za This test is not yet approved or cleared by the [place] FDA and has been authorized for detection and/or diagnosis of SARS-CoV-2 by FDA under an Emergency Use Authorization (EUA).  This EUA will remain in effect (meaning this test can be used) for the duration of the COVID-19 declaration under Section 564(b)(1) of the Act, 21 U.S.C. section 360bbb-3(b)(1), unless the authorization is terminated or revoked sooner. Performed at [facility], [place] [address] [place], [place] [address]   Urine culture     Status: Abnormal    Collection Time: 03/24/19  2:53 PM  Result Value Ref Range Status   Specimen Description URINE, CLEAN CATCH  Final   Special Requests NONE  Final   Culture (A)  Final    <10,000 COLONIES/mL INSIGNIFICANT GROWTH Performed at [facility], [place] [address] [place], [place] [address]    Report Status 03/25/2019 FINAL  Final         Radiology Studies: No results found.      Scheduled Meds: . clopidogrel  75 mg Oral Daily  . enoxaparin (LOVENOX) injection  30 mg Subcutaneous Q24H  . montelukast  10 mg Oral QHS  . pantoprazole  40 mg Oral Daily   Continuous  Infusions:    LOS: 1 day    Time spent: 16min    Domenic Polite, MD [facility]  03/26/2019, 3:04 PM

## 2019-03-26 NOTE — TOC Initial Note (Signed)
Transition of Care [facility]) - Initial/Assessment Note    Patient Details  Name: Edward [name] MRN: [id number] Date of Birth: 30-Sep-1930  Transition of Care [facility]) CM/SW Contact:    Bartholomew Crews, RN Phone Number: [phone] 03/26/2019, 1:59 PM  Clinical Narrative:                 Received call from MD to discuss referral to hospice. Spoke with spouse, Gay Filler, on the phone. Choice offered for hospice agencies. Referral made to [facility]. CM to follow for transition of care needs.   Expected Discharge Plan: Home w Hospice Care Barriers to Discharge: Continued Medical Work up   Patient Goals and CMS Choice   CMS Medicare.gov Compare Post Acute Care list provided to:: Patient Represenative (must comment)(spouse - Gay Filler [place]) Choice offered to / list presented to : Spouse  Expected Discharge Plan and Services Expected Discharge Plan: Home w Hospice Care In-house Referral: Hospice / Palliative Care Discharge Planning Services: CM Consult Post Acute Care Choice: Hospice Living arrangements for the past 2 months: Single Family Home                 DME Arranged: N/A DME Agency: NA       HH Arranged: RN [place] Agency: [facility] Date HH Agency Contacted: 03/26/19 Time HH Agency Contacted: 1359    Prior Living Arrangements/Services Living arrangements for the past 2 months: [place] Lives with:: Self, Spouse                   Activities of Daily Living      Permission Sought/Granted                  Emotional Assessment              Admission diagnosis:  Dehydration [E86.0] Elevated troponin [R79.89] AKI (acute kidney injury) ([place]) [N17.9] Dementia with behavioral disturbance, unspecified dementia type ([place]) [F03.91] Patient Active Problem List   Diagnosis Date Noted  . AKI (acute kidney injury) ([place]) 03/25/2019  . Dehydration 03/24/2019  . Diarrhea 03/24/2019  .  Acute renal failure (ARF) ([place]) 03/24/2019  . Sepsis ([place]) 07/22/2018  . Pneumonia 02/08/2018  . Rigors 02/08/2018  . Leukocytosis 02/08/2018  . Fever 02/08/2018  . Chronic diastolic CHF (congestive heart failure) ([place]) 09/22/2017  . CAD (coronary artery disease) 09/18/2017  . Sepsis due to pneumonia ([place]) 09/18/2017  . Acute encephalopathy 09/18/2017  . Mild renal insufficiency 09/18/2017  . Memory disorder 03/21/2017  . General weakness 09/04/2016  . Near syncope 09/04/2016  . Essential hypertension   . Chronic sinus bradycardia 11/30/2015  . HLD (hyperlipidemia) 02/15/2008  . Aortic valve disorder 02/15/2008  . COPD (chronic obstructive pulmonary disease) ([place]) 02/15/2008   PCP:  [name], MD Pharmacy:   [id number] [place], [place] [address] Phone: [phone] Fax: [phone]     Social Determinants of Health (SDOH) Interventions    Readmission Risk Interventions No flowsheet data found.

## 2019-03-26 NOTE — Progress Notes (Signed)
Notified MD Babette Relic page k=3.4  Paulla Fore, RN

## 2019-03-26 NOTE — Progress Notes (Signed)
Daily Progress Note   Patient Name: Edward [name]       Date: 03/26/2019 DOB: 25-Apr-1930  Age: 85 y.o. MRN#: [id number] Attending Physician: Domenic Polite, MD Primary Care Physician: [name], MD Admit Date: 03/24/2019  Reason for Consultation/Follow-up: Establishing goals of care  Subjective: Patient is asleep, easily aroused. Confused at baseline. Will open eyes and then close while responding to commands. Denies pain or shortness of breath. Appears comfortable, no signs of agitation.   I spoke with his wife, Gay Filler and son, Nicki Reaper over the phone and provided updates. I also spoke with Judie Petit, RN at Dr. Baldwin Crown office to provide updates and gain insight on their recommendations as wife wanted to be sure that Dr. Baldwin Crown team was included in decisions. Per nurse Ria Comment, NP had made a previous referral to outpatient palliative prior to admission with goals of evaluating and making recommendations for palliative versus hospice. She expressed hospice most likely was the best option given his continued decline. Wife verbalized her agreement with recommendations and she also spoke with staff at [facility] office.   Both wife and son are in agreement of outpatient hospice at discharge. Son states they will provide care with goal of not placing his father in a facility. Support given. They are aware of the goals and assistance provided by hospice. Again we discussed patient's condition and decline. Family verbalized understanding and expressed mutually the goal is to have him home once stable amongst family and kept comfortable. Support given.   All questions answered. Wife is aware that DNR form has been completed and placed on chart for home use at discharge.   Length of Stay: 1  Current  Medications: Scheduled Meds:  . clopidogrel  75 mg Oral Daily  . enoxaparin (LOVENOX) injection  30 mg Subcutaneous Q24H  . montelukast  10 mg Oral QHS  . pantoprazole  40 mg Oral Daily    Continuous Infusions: . sodium chloride 75 mL/hr at 03/25/19 1043    PRN Meds: acetaminophen **OR** acetaminophen, ondansetron **OR** ondansetron (ZOFRAN) IV, QUEtiapine  Physical Exam Vitals signs and nursing note reviewed.  Constitutional:      General: He is sleeping.     Appearance: He is ill-appearing.     Comments: Chronically ill appearing, frail, thin   Cardiovascular:     Rate and  Rhythm: Normal rate and regular rhythm.     Pulses: Decreased pulses.     Heart sounds: Normal heart sounds.  Pulmonary:     Effort: Pulmonary effort is normal.     Breath sounds: Decreased breath sounds present.  Neurological:     Mental Status: He is easily aroused. He is confused.     Motor: Weakness and atrophy present.     Comments: Dementia at baseline   Psychiatric:        Cognition and Memory: Cognition is impaired. Memory is impaired.        Judgment: Judgment is inappropriate.             Vital Signs: BP (!) 124/56 (BP Location: Left Arm)   Pulse 70   Temp 98.1 F (36.7 C) (Oral)   Resp 17   Ht 5' (1.524 m)   Wt 43.1 kg   SpO2 97%   BMI 18.55 kg/m  SpO2: SpO2: 97 % O2 Device: O2 Device: Room Air O2 Flow Rate:    Intake/output summary:   Intake/Output Summary (Last 24 hours) at 03/26/2019 1138 Last data filed at 03/26/2019 1021 Gross per 24 hour  Intake 1820.25 ml  Output 200 ml  Net 1620.25 ml   LBM: Last BM Date: 03/26/19 Baseline Weight: Weight: 40.8 kg Most recent weight: Weight: 43.1 kg       Palliative Assessment/Data: PPS 20%    Patient Active Problem List   Diagnosis Date Noted  . AKI (acute kidney injury) ([place]) 03/25/2019  . Dehydration 03/24/2019  . Diarrhea 03/24/2019  . Acute renal failure (ARF) ([place]) 03/24/2019  . Sepsis ([place]) 07/22/2018  . Pneumonia  02/08/2018  . Rigors 02/08/2018  . Leukocytosis 02/08/2018  . Fever 02/08/2018  . Chronic diastolic CHF (congestive heart failure) ([place]) 09/22/2017  . CAD (coronary artery disease) 09/18/2017  . Sepsis due to pneumonia ([place]) 09/18/2017  . Acute encephalopathy 09/18/2017  . Mild renal insufficiency 09/18/2017  . Memory disorder 03/21/2017  . General weakness 09/04/2016  . Near syncope 09/04/2016  . Essential hypertension   . Chronic sinus bradycardia 11/30/2015  . HLD (hyperlipidemia) 02/15/2008  . Aortic valve disorder 02/15/2008  . COPD (chronic obstructive pulmonary disease) ([place]) 02/15/2008    Palliative Care Assessment & Plan   Patient Profile: Palliative Care consult requested for this 85 y.o. male with multiple medical problems including hypertension, COPD, hyperlipidemia, CVA, coronary artery disease, advanced dementia, and chronic diastolic congestive heart failure. He was admitted after worsening diarrhea for almost a week and decreased po intake. Family reported progressive dementia over the past several months with worsening memory, appetite, and weakness.    Recommendations/Plan:  DNR/DNI-as confirmed by wife/son (DNR on chart)  Continue to treat with goal of returning home with family with support from hospice.   Spoke with Judie Petit, RN at Dr. Baldwin Crown office who confirms previous referral to palliative outpatient by Ria Comment, NP. Reports they are in agreement with hospice and have spoken with patient's family regarding their support.   Case Management referral for outpatient hospice. Wife states preference is [facility]. States they may require bedside commode and wheelchair.   PMT will continue to support and follow.   Goals of Care and Additional Recommendations:  Limitations on Scope of Treatment: Minimize Medications and continue to treat the treatable with no escalation of care.   Code Status:    Code Status Orders  (From admission, onward)         Start      Ordered  03/24/19 1618  Do not attempt resuscitation (DNR)  Continuous    Question Answer Comment  In the event of cardiac or respiratory ARREST Do not call a "code blue"   In the event of cardiac or respiratory ARREST Do not perform Intubation, CPR, defibrillation or ACLS   In the event of cardiac or respiratory ARREST Use medication by any route, position, wound care, and other measures to relive pain and suffering. May use oxygen, suction and manual treatment of airway obstruction as needed for comfort.      03/24/19 1620        Code Status History    Date Active Date Inactive Code Status Order ID Comments User Context   07/22/2018 1454 07/24/2018 1632 Full Code [id number]  Lady Deutscher, MD ED   02/08/2018 2018 02/10/2018 1654 Full Code [id number]  Geradine Girt, DO ED   01/13/2018 0417 01/13/2018 1945 Full Code [id number]  Rise Patience, MD Inpatient   09/18/2017 0501 09/22/2017 2044 Full Code [id number]  Vianne Bulls, MD ED   09/04/2016 2029 09/06/2016 1704 Full Code [id number]  Robbie Lis, MD Inpatient   11/30/2015 1648 12/04/2015 1533 DNR [id number]  Samella Parr, NP ED   03/18/2015 0153 03/19/2015 1827 Full Code [id number]  Rolm Bookbinder, MD Inpatient    Advance Directive Documentation     Most Recent Value  Type of Advance Directive  Healthcare Power of Attorney, Living will  Pre-existing out of facility DNR order (yellow form or pink MOST form)  -  "MOST" Form in Place?  -     Prognosis:   Poor   Discharge Planning:  Home with Hospice  Care plan was discussed with bedside RN, patient's wife, son, and Dr. Broadus John.   Thank you for allowing the Palliative Medicine Team to assist in the care of this patient.  Total Time: 45 min.  Greater than 50%  of this time was spent counseling and coordinating care related to the above assessment and plan  Alda Lea, AGPCNP-BC Palliative Medicine Team  Phone: [phone] Pager: [phone]  Amion: Bjorn Pippin   Please contact Palliative Medicine Team phone at [phone] for questions and concerns.

## 2019-03-26 NOTE — Progress Notes (Addendum)
[facility]) Hospice  Patient was referred to our community based palliative care, prior to beginning was admitted to hospital.  Referred by Abigail Butts RN Case Manager for home hospice services after discharge.  Pt and chart under review by [facility] MD and eligibility is pending at this time.  Spoke with wife Gay Filler, confirmed interest, explained services.    Plan is to d/c in am, POV by Gay Filler and pt son.  DME discussed, will need walker and bedside commode.  [facility] will order.  Please send DNR form home with pt, as well as any comfort medications that may be needed prior to hospice services starting.  Please fax d/c summary to [phone]  Thank you for this referral, Venia Carbon RN, BSN, [facility] Liaison (in [place]) [phone]  **Update, eligibility has been confirmed for hospice services at home

## 2019-03-27 NOTE — Progress Notes (Signed)
Edward [name] to be discharged home with wife per MD order. Discussed prescriptions and follow up appointments with the patient. Prescriptions given to patient; medication list explained in detail. Patient verbalized understanding.  Skin clean, dry and intact without evidence of skin break down, no evidence of skin tears noted. IV catheter discontinued intact. Site without signs and symptoms of complications. Dressing and pressure applied. Pt denies pain at the site currently. No complaints noted.  Patient free of lines, drains, and wounds.   An After Visit Summary (AVS) was printed and given to the patient. Patient escorted via wheelchair, and discharged home via private auto.  [name], RN

## 2019-03-27 NOTE — Discharge Summary (Signed)
Physician Discharge Summary  Edward [name] TKW:[id number] DOB: 1930/02/29 DOA: 03/24/2019  PCP: Edward Bowen, MD  Admit date: 03/24/2019 Discharge date: 03/27/2019  Time spent: 35 minutes  Recommendations for Outpatient Follow-up:  1. Home with hospice   Discharge Diagnoses:  Principal Problem:   Dehydration .Failure to thrive Advanced dementia Severe protein calorie malnutrition   HLD (hyperlipidemia)   COPD (chronic obstructive pulmonary disease) (HCC)   Essential hypertension   Memory disorder   Chronic diastolic CHF (congestive heart failure) (HCC)   Diarrhea   Acute renal failure (ARF) (HCC)   AKI (acute kidney injury) ([place])   Discharge Condition: poor  Diet recommendation: [place]fort feeds  Filed Weights   03/24/19 2005 03/25/19 2005 03/27/19 0441  Weight: 43.1 kg 43.1 kg 44.2 kg    History of present illness:  Edward [name] a 84 y.o.malewith medical history significant ofCVA; dementia; HTN; HLD; CAD; and COPD presented with dehydration.He has had diarrhea most of the week,worse since Wednesday. They did a virtual visit and had labs ordered - kidney function was worsening. PCP stopped metoprolol, Lipitor -plan to send out palliative care for evaluation next week.  He has not been eating well. In the last months, his memory is getting worse. His wife notices that "he's going down". They gave him Remeron but he isn't sleeping. He does know close contacts who he sees regularly. He has lost 16 pounds since January. His wife is seeing his progressive dementia and he is becoming increasingly hard to care for.  In ED labs noted, AKi, CT showed diarrhea, covid negative  Hospital Course:   Dehydration with acute renal failure -Patient with baseline advanced dementia which has progressed over the last 6 months, very poor p.o. intake, recently worsened -Worsened in the setting of diarrhea and GI losses -Creatinine improved with hydration -However has minimal  oral intake and overall failure to thrive in the setting of advanced dementia, hence palliative medicine was consulted -Status post goals of care discussions with Palliative medicine with pts son and wife,  including discussions with PCP, now plan for home with hospice today  Diarrhea -Uncertain if this is infectious  -C. difficile PCR is pending, SARS COVID-19 is negative -Appears to have resolved, continue supportive care, advance diet  Advanced dementia -Patient has advanced dementia, with worsening confusion, cognition -With ongoing failure to thrive, very poor p.o. intake, weight loss and severe malnutrition -Palliative consulted for goals of care, this was being set up as outpatient for evaluation, see discussion regarding palliative consultation obtained inpatient, plan for home with hospice services today  HTN -ACE inhibitor stopped  HLD -Lipitor stopped by PCP  Chronic diastolic CHF -2/5 Echo with grade 2 diastolic dysfunction -No evidence of volume overload at this time  COPD -Appears compensated at this time  History of CVA -On Plavix  Discharge Exam: Vitals:   03/27/19 0441 03/27/19 0912  BP: (!) 120/57 (!) 127/58  Pulse: 83 91  Resp: 16 16  Temp: 97.9 F (36.6 C) 98.6 F (37 C)  SpO2: 100% 99%    General: AAOx3 Cardiovascular: S1S2/RRR Respiratory: CTAB  Discharge Instructions   Discharge Instructions    Discharge instructions   Complete by:  As directed    Home with Hospice for [place] focused care   Increase activity slowly   Complete by:  As directed      Allergies as of 03/27/2019      Reactions   Celexa [citalopram Hydrobromide]    Insomnia, agitation, confused  Aspirin Rash      Medication List    STOP taking these medications   atorvastatin 40 MG tablet Commonly known as:  LIPITOR   clopidogrel 75 MG tablet Commonly known as:  PLAVIX   ramipril 2.5 MG capsule Commonly known as:  ALTACE   zafirlukast 20 MG  tablet Commonly known as:  ACCOLATE     TAKE these medications   acetaminophen 325 MG tablet Commonly known as:  TYLENOL Take 2 tablets (650 mg total) by mouth every 6 (six) hours as needed for mild pain (or Fever >/= 101).   fluticasone 50 MCG/ACT nasal spray Commonly known as:  FLONASE Place 2 sprays into both nostrils daily as needed for allergies or rhinitis.   memantine 10 MG tablet Commonly known as:  NAMENDA TAKE 1 TABLET BY MOUTH TWICE A DAY   mometasone 50 MCG/ACT nasal spray Commonly known as:  NASONEX Place 2 sprays into the nose daily as needed (for allergies).   pantoprazole 40 MG tablet Commonly known as:  PROTONIX Take 40 mg by mouth daily.   venlafaxine XR 37.5 MG 24 hr capsule Commonly known as:  Effexor XR Take 1 capsule (37.5 mg total) by mouth daily with breakfast.      Allergies  Allergen Reactions  . Celexa [Citalopram Hydrobromide]     Insomnia, agitation, confused  . Aspirin Rash      The results of significant diagnostics from this hospitalization (including imaging, microbiology, ancillary and laboratory) are listed below for reference.    Significant Diagnostic Studies: Ct Abdomen Pelvis Wo Contrast  Result Date: 03/24/2019 CLINICAL DATA:  Pt presents to ED for dehydration because of having diarrhea for a week EXAM: CT ABDOMEN AND PELVIS WITHOUT CONTRAST TECHNIQUE: Multidetector CT imaging of the abdomen and pelvis was performed following the standard protocol without IV contrast. COMPARISON:  09/18/2017 FINDINGS: Lower chest: No acute abnormality.  Coronary artery atherosclerosis. Hepatobiliary: No focal hepatic mass. Distended gallbladder with possible small gallstone versus polyp along the dependent aspect. No intrahepatic or extrahepatic biliary ductal dilatation. Pancreas: Unremarkable. No pancreatic ductal dilatation or surrounding inflammatory changes. Spleen: Normal in size without focal abnormality. Adrenals/Urinary Tract: Normal adrenal  glands. 4.4 x 5 cm stable right renal cyst. No urolithiasis or obstructive uropathy. Bladder wall thickening which may be secondary to underdistention versus bladder outlet obstruction. Stomach/Bowel: Stomach is normal. No bowel dilatation to suggest bowel obstruction. Air-fluid level in the ascending colon with small amount of fluid in the rectosigmoid colon as can be seen with diarrhea. No pneumatosis, pneumoperitoneum or portal venous gas. Vascular/Lymphatic: Normal caliber abdominal aorta with mild atherosclerosis. No lymphadenopathy. Reproductive: Mildly enlarged prostate gland. Other: No abdominal wall hernia or abnormality. No abdominopelvic ascites. Musculoskeletal: No acute osseous abnormality. No aggressive osseous lesion. Degenerative disease with disc height loss throughout the thoracolumbar spine with facet arthropathy. Chronic T11 and T12 vertebral body compression fractures. Prior left hip ORIF. IMPRESSION: 1. Air-fluid level in the ascending colon with small amount of fluid in the rectosigmoid colon as can be seen with diarrhea. 2. Bladder wall thickening which may be secondary to underdistention versus bladder outlet obstruction. Mildly enlarged prostate gland. 3. Distended gallbladder with possible small gallstone versus polyp along the dependent aspect. 4.  Aortic Atherosclerosis (ICD10-I70.0). Electronically Signed   By: Kathreen Devoid   On: 03/24/2019 12:18   Dg Chest 1 View  Result Date: 03/24/2019 CLINICAL DATA:  Dehydration EXAM: CHEST  1 VIEW COMPARISON:  12/27/2018 chest radiograph. FINDINGS: Stable cardiomediastinal silhouette with  normal heart size. No pneumothorax. No pleural effusion. Lungs appear clear, with no acute consolidative airspace disease and no pulmonary edema. IMPRESSION: No active disease. Electronically Signed   By: Ilona Sorrel M.D.   On: 03/24/2019 14:25    Microbiology: Recent Results (from the past 240 hour(s))  Blood culture (routine x 2)     Status: None  (Preliminary result)   Collection Time: 03/24/19 10:40 AM  Result Value Ref Range Status   Specimen Description BLOOD LEFT WRIST  Final   Special Requests   Final    BOTTLES DRAWN AEROBIC ONLY Blood Culture results may not be optimal due to an inadequate volume of blood received in culture bottles   Culture   Final    NO GROWTH 3 DAYS Performed at [facility], [place] [address], [place], [place] [address]    Report Status PENDING  Incomplete  Blood culture (routine x 2)     Status: None (Preliminary result)   Collection Time: 03/24/19 10:56 AM  Result Value Ref Range Status   Specimen Description BLOOD RIGHT ANTECUBITAL  Final   Special Requests   Final    BOTTLES DRAWN AEROBIC AND ANAEROBIC Blood Culture results may not be optimal due to an inadequate volume of blood received in culture bottles   Culture   Final    NO GROWTH 3 DAYS Performed at [facility], [place] [address] [place], [place] [address]    Report Status PENDING  Incomplete  SARS Coronavirus 2 [facility] order, Performed in [facility] lab)     Status: None   Collection Time: 03/24/19 11:03 AM  Result Value Ref Range Status   SARS Coronavirus 2 NEGATIVE NEGATIVE Final    Comment: (NOTE) If result is NEGATIVE SARS-CoV-2 target nucleic acids are NOT DETECTED. The SARS-CoV-2 RNA is generally detectable in upper and lower  respiratory specimens during the acute phase of infection. The lowest  concentration of SARS-CoV-2 viral copies this assay can detect is 250  copies / mL. A negative result does not preclude SARS-CoV-2 infection  and should not be used as the sole basis for treatment or other  patient management decisions.  A negative result may occur with  improper specimen collection / handling, submission of specimen other  than nasopharyngeal swab, presence of viral mutation(s) within the  areas targeted by this assay, and inadequate number of viral copies  (<250 copies / mL). A negative result  must be combined with clinical  observations, patient history, and epidemiological information. If result is POSITIVE SARS-CoV-2 target nucleic acids are DETECTED. The SARS-CoV-2 RNA is generally detectable in upper and lower  respiratory specimens dur ing the acute phase of infection.  Positive  results are indicative of active infection with SARS-CoV-2.  Clinical  correlation with patient history and other diagnostic information is  necessary to determine patient infection status.  Positive results do  not rule out bacterial infection or co-infection with other viruses. If result is PRESUMPTIVE POSTIVE SARS-CoV-2 nucleic acids MAY BE PRESENT.   A presumptive positive result was obtained on the submitted specimen  and confirmed on repeat testing.  While 2019 novel coronavirus  (SARS-CoV-2) nucleic acids may be present in the submitted sample  additional confirmatory testing may be necessary for epidemiological  and / or clinical management purposes  to differentiate between  SARS-CoV-2 and other Sarbecovirus currently known to infect humans.  If clinically indicated additional testing with an alternate test  methodology [id number]) is advised. The  SARS-CoV-2 RNA is generally  detectable in upper and lower respiratory sp ecimens during the acute  phase of infection. The expected result is Negative. Fact Sheet for Patients:  StrictlyIdeas.no Fact Sheet for Healthcare Providers: BankingDealers.co.za This test is not yet approved or cleared by the [place] FDA and has been authorized for detection and/or diagnosis of SARS-CoV-2 by FDA under an Emergency Use Authorization (EUA).  This EUA will remain in effect (meaning this test can be used) for the duration of the COVID-19 declaration under Section 564(b)(1) of the Act, 21 U.S.C. section 360bbb-3(b)(1), unless the authorization is terminated or revoked sooner. Performed at [facility], [place] [address] [place], [place] [address]   Urine culture     Status: Abnormal   Collection Time: 03/24/19  2:53 PM  Result Value Ref Range Status   Specimen Description URINE, CLEAN CATCH  Final   Special Requests NONE  Final   Culture (A)  Final    <10,000 COLONIES/mL INSIGNIFICANT GROWTH Performed at [facility], [place] [address] [place], [place] [address]    Report Status 03/25/2019 FINAL  Final     Labs: Basic Metabolic Panel: Recent Labs  Lab 03/24/19 1058 03/25/19 0810  NA 140 143  K 3.6 3.4*  CL 107 115*  CO2 18* 17*  GLUCOSE 111* 95  BUN 51* 39*  CREATININE 2.94* 1.36*  CALCIUM 8.6* 8.1*   Liver Function Tests: Recent Labs  Lab 03/24/19 1058  AST 19  ALT 13  ALKPHOS 124  BILITOT 0.8  PROT 6.8  ALBUMIN 4.2   Recent Labs  Lab 03/24/19 1058  LIPASE 17   No results for input(s): AMMONIA in the last 168 hours. CBC: Recent Labs  Lab 03/24/19 1058 03/25/19 0810  WBC 8.6 7.4  NEUTROABS 5.6  --   HGB 12.7* 11.5*  HCT 41.0 36.1*  MCV 90.7 87.6  PLT 206 155   Cardiac Enzymes: Recent Labs  Lab 03/24/19 1100  TROPONINI 0.04*   BNP: BNP (last 3 results) No results for input(s): BNP in the last 8760 hours.  ProBNP (last 3 results) No results for input(s): PROBNP in the last 8760 hours.  CBG: Recent Labs  Lab 03/24/19 1000  GLUCAP 101*       Signed:  Domenic Polite MD.  [facility] 03/27/2019, 4:13 PM

## 2019-03-29 LAB — CULTURE, BLOOD (ROUTINE X 2)
Culture: NO GROWTH
Culture: NO GROWTH

## 2019-04-12 ENCOUNTER — Telehealth: Payer: Self-pay | Admitting: Neurology

## 2019-04-12 NOTE — Telephone Encounter (Signed)
Events noted

## 2019-04-12 NOTE — Telephone Encounter (Signed)
We have been informed that the pt has passed  04/16/19

## 2019-04-19 ENCOUNTER — Ambulatory Visit: Payer: Medicare Other | Admitting: Podiatry

## 2019-04-23 DEATH — deceased

## 2019-05-06 ENCOUNTER — Other Ambulatory Visit: Payer: Self-pay | Admitting: Cardiovascular Disease

## 2019-06-04 ENCOUNTER — Ambulatory Visit: Payer: Medicare Other | Admitting: Neurology
